# Patient Record
Sex: Female | Born: 1935
Health system: Southern US, Community
[De-identification: ages and names within clinical notes are randomized; demographics above are authoritative.]

## PROBLEM LIST (undated history)

## (undated) DIAGNOSIS — I739 Peripheral vascular disease, unspecified: Secondary | ICD-10-CM

## (undated) DIAGNOSIS — I69354 Hemiplegia and hemiparesis following cerebral infarction affecting left non-dominant side: Secondary | ICD-10-CM

## (undated) DIAGNOSIS — I639 Cerebral infarction, unspecified: Secondary | ICD-10-CM

## (undated) DIAGNOSIS — R251 Tremor, unspecified: Secondary | ICD-10-CM

## (undated) DIAGNOSIS — I10 Essential (primary) hypertension: Secondary | ICD-10-CM

## (undated) DIAGNOSIS — I779 Disorder of arteries and arterioles, unspecified: Secondary | ICD-10-CM

## (undated) HISTORY — PX: CAROTID ENDARTERECTOMY: SUR193

## (undated) HISTORY — DX: Peripheral vascular disease, unspecified: I73.9

## (undated) HISTORY — DX: Essential (primary) hypertension: I10

## (undated) HISTORY — DX: Disorder of arteries and arterioles, unspecified: I77.9

---

## 1995-05-15 DIAGNOSIS — I639 Cerebral infarction, unspecified: Secondary | ICD-10-CM

## 1995-05-15 HISTORY — DX: Cerebral infarction, unspecified: I63.9

## 2000-11-07 ENCOUNTER — Other Ambulatory Visit: Admission: RE | Admit: 2000-11-07 | Discharge: 2000-11-07 | Payer: Self-pay | Admitting: Dermatology

## 2000-11-30 ENCOUNTER — Emergency Department (HOSPITAL_COMMUNITY): Admission: EM | Admit: 2000-11-30 | Discharge: 2000-12-01 | Payer: Self-pay | Admitting: Emergency Medicine

## 2000-12-01 ENCOUNTER — Encounter: Payer: Self-pay | Admitting: Emergency Medicine

## 2001-05-12 ENCOUNTER — Encounter: Payer: Self-pay | Admitting: Family Medicine

## 2001-05-12 ENCOUNTER — Ambulatory Visit (HOSPITAL_COMMUNITY): Admission: RE | Admit: 2001-05-12 | Discharge: 2001-05-12 | Payer: Self-pay | Admitting: Family Medicine

## 2002-05-13 ENCOUNTER — Ambulatory Visit (HOSPITAL_COMMUNITY): Admission: RE | Admit: 2002-05-13 | Discharge: 2002-05-13 | Payer: Self-pay | Admitting: Family Medicine

## 2002-05-13 ENCOUNTER — Encounter: Payer: Self-pay | Admitting: Family Medicine

## 2002-07-22 ENCOUNTER — Other Ambulatory Visit: Admission: RE | Admit: 2002-07-22 | Discharge: 2002-07-22 | Payer: Self-pay | Admitting: Dermatology

## 2003-04-05 ENCOUNTER — Ambulatory Visit (HOSPITAL_COMMUNITY): Admission: RE | Admit: 2003-04-05 | Discharge: 2003-04-05 | Payer: Self-pay | Admitting: Family Medicine

## 2003-12-28 ENCOUNTER — Emergency Department (HOSPITAL_COMMUNITY): Admission: EM | Admit: 2003-12-28 | Discharge: 2003-12-28 | Payer: Self-pay | Admitting: Emergency Medicine

## 2005-04-23 ENCOUNTER — Ambulatory Visit (HOSPITAL_COMMUNITY): Admission: RE | Admit: 2005-04-23 | Discharge: 2005-04-23 | Payer: Self-pay | Admitting: Family Medicine

## 2005-04-23 ENCOUNTER — Ambulatory Visit: Payer: Self-pay | Admitting: Orthopedic Surgery

## 2005-05-01 ENCOUNTER — Ambulatory Visit (HOSPITAL_COMMUNITY): Admission: RE | Admit: 2005-05-01 | Discharge: 2005-05-01 | Payer: Self-pay | Admitting: Internal Medicine

## 2005-06-04 ENCOUNTER — Ambulatory Visit: Payer: Self-pay | Admitting: Orthopedic Surgery

## 2005-07-02 ENCOUNTER — Ambulatory Visit: Payer: Self-pay | Admitting: Orthopedic Surgery

## 2010-07-25 ENCOUNTER — Emergency Department (HOSPITAL_COMMUNITY)
Admission: EM | Admit: 2010-07-25 | Discharge: 2010-07-25 | Disposition: A | Discharge status: Home / Self Care | Payer: MEDICARE | Attending: Emergency Medicine | Admitting: Emergency Medicine

## 2010-07-25 DIAGNOSIS — L02419 Cutaneous abscess of limb, unspecified: Secondary | ICD-10-CM | POA: Insufficient documentation

## 2010-07-25 DIAGNOSIS — L089 Local infection of the skin and subcutaneous tissue, unspecified: Secondary | ICD-10-CM | POA: Insufficient documentation

## 2010-07-25 DIAGNOSIS — L03119 Cellulitis of unspecified part of limb: Secondary | ICD-10-CM | POA: Insufficient documentation

## 2010-07-25 DIAGNOSIS — Z8673 Personal history of transient ischemic attack (TIA), and cerebral infarction without residual deficits: Secondary | ICD-10-CM | POA: Insufficient documentation

## 2011-08-03 ENCOUNTER — Other Ambulatory Visit: Payer: Self-pay

## 2011-08-03 ENCOUNTER — Encounter (HOSPITAL_COMMUNITY): Payer: Self-pay

## 2011-08-03 ENCOUNTER — Emergency Department (HOSPITAL_COMMUNITY): Payer: Medicare Other

## 2011-08-03 ENCOUNTER — Inpatient Hospital Stay (HOSPITAL_COMMUNITY): Payer: Medicare Other

## 2011-08-03 ENCOUNTER — Inpatient Hospital Stay (HOSPITAL_COMMUNITY)
Admission: EM | Admit: 2011-08-03 | Discharge: 2011-08-06 | DRG: 923 | Disposition: A | Discharge status: Home / Self Care | Payer: Medicare Other | Attending: Internal Medicine | Admitting: Internal Medicine

## 2011-08-03 DIAGNOSIS — S93409A Sprain of unspecified ligament of unspecified ankle, initial encounter: Secondary | ICD-10-CM | POA: Diagnosis present

## 2011-08-03 DIAGNOSIS — R071 Chest pain on breathing: Secondary | ICD-10-CM | POA: Diagnosis present

## 2011-08-03 DIAGNOSIS — I517 Cardiomegaly: Secondary | ICD-10-CM | POA: Diagnosis present

## 2011-08-03 DIAGNOSIS — E876 Hypokalemia: Secondary | ICD-10-CM | POA: Diagnosis present

## 2011-08-03 DIAGNOSIS — R7989 Other specified abnormal findings of blood chemistry: Secondary | ICD-10-CM | POA: Diagnosis present

## 2011-08-03 DIAGNOSIS — M25552 Pain in left hip: Secondary | ICD-10-CM | POA: Diagnosis present

## 2011-08-03 DIAGNOSIS — Y93H2 Activity, gardening and landscaping: Secondary | ICD-10-CM

## 2011-08-03 DIAGNOSIS — IMO0002 Reserved for concepts with insufficient information to code with codable children: Secondary | ICD-10-CM | POA: Diagnosis present

## 2011-08-03 DIAGNOSIS — M6282 Rhabdomyolysis: Secondary | ICD-10-CM | POA: Diagnosis present

## 2011-08-03 DIAGNOSIS — S20219A Contusion of unspecified front wall of thorax, initial encounter: Secondary | ICD-10-CM | POA: Diagnosis present

## 2011-08-03 DIAGNOSIS — R001 Bradycardia, unspecified: Secondary | ICD-10-CM | POA: Diagnosis present

## 2011-08-03 DIAGNOSIS — R799 Abnormal finding of blood chemistry, unspecified: Secondary | ICD-10-CM | POA: Diagnosis present

## 2011-08-03 DIAGNOSIS — E860 Dehydration: Secondary | ICD-10-CM | POA: Diagnosis present

## 2011-08-03 DIAGNOSIS — I10 Essential (primary) hypertension: Secondary | ICD-10-CM | POA: Diagnosis present

## 2011-08-03 DIAGNOSIS — I498 Other specified cardiac arrhythmias: Secondary | ICD-10-CM | POA: Diagnosis present

## 2011-08-03 DIAGNOSIS — W1809XA Striking against other object with subsequent fall, initial encounter: Secondary | ICD-10-CM | POA: Diagnosis present

## 2011-08-03 DIAGNOSIS — M25559 Pain in unspecified hip: Secondary | ICD-10-CM | POA: Diagnosis present

## 2011-08-03 DIAGNOSIS — I6930 Unspecified sequelae of cerebral infarction: Secondary | ICD-10-CM

## 2011-08-03 DIAGNOSIS — D72829 Elevated white blood cell count, unspecified: Secondary | ICD-10-CM | POA: Diagnosis present

## 2011-08-03 DIAGNOSIS — I2589 Other forms of chronic ischemic heart disease: Secondary | ICD-10-CM | POA: Diagnosis present

## 2011-08-03 DIAGNOSIS — W19XXXA Unspecified fall, initial encounter: Secondary | ICD-10-CM | POA: Diagnosis present

## 2011-08-03 DIAGNOSIS — L98499 Non-pressure chronic ulcer of skin of other sites with unspecified severity: Secondary | ICD-10-CM | POA: Diagnosis present

## 2011-08-03 DIAGNOSIS — I69959 Hemiplegia and hemiparesis following unspecified cerebrovascular disease affecting unspecified side: Secondary | ICD-10-CM

## 2011-08-03 DIAGNOSIS — R748 Abnormal levels of other serum enzymes: Secondary | ICD-10-CM | POA: Diagnosis present

## 2011-08-03 DIAGNOSIS — T68XXXA Hypothermia, initial encounter: Principal | ICD-10-CM | POA: Diagnosis present

## 2011-08-03 DIAGNOSIS — X31XXXA Exposure to excessive natural cold, initial encounter: Secondary | ICD-10-CM | POA: Diagnosis present

## 2011-08-03 DIAGNOSIS — I69354 Hemiplegia and hemiparesis following cerebral infarction affecting left non-dominant side: Secondary | ICD-10-CM

## 2011-08-03 HISTORY — DX: Cerebral infarction, unspecified: I63.9

## 2011-08-03 HISTORY — DX: Tremor, unspecified: R25.1

## 2011-08-03 HISTORY — DX: Hemiplegia and hemiparesis following cerebral infarction affecting left non-dominant side: I69.354

## 2011-08-03 LAB — DIFFERENTIAL
Basophils Absolute: 0 10*3/uL (ref 0.0–0.1)
Eosinophils Relative: 0 % (ref 0–5)
Lymphocytes Relative: 6 % — ABNORMAL LOW (ref 12–46)
Lymphs Abs: 1.3 10*3/uL (ref 0.7–4.0)
Monocytes Absolute: 1.3 10*3/uL — ABNORMAL HIGH (ref 0.1–1.0)
Neutro Abs: 18.5 10*3/uL — ABNORMAL HIGH (ref 1.7–7.7)

## 2011-08-03 LAB — COMPREHENSIVE METABOLIC PANEL
ALT: 23 U/L (ref 0–35)
AST: 49 U/L — ABNORMAL HIGH (ref 0–37)
CO2: 24 mEq/L (ref 19–32)
Calcium: 10.2 mg/dL (ref 8.4–10.5)
Chloride: 97 mEq/L (ref 96–112)
Creatinine, Ser: 0.61 mg/dL (ref 0.50–1.10)
GFR calc Af Amer: >90 mL/min (ref >90)
GFR calc non Af Amer: 79 mL/min — ABNORMAL LOW (ref >90)
Glucose, Bld: 142 mg/dL — ABNORMAL HIGH (ref 70–99)
Sodium: 138 mEq/L (ref 135–145)
Total Bilirubin: 0.9 mg/dL (ref 0.3–1.2)

## 2011-08-03 LAB — MAGNESIUM: Magnesium: 1.9 mg/dL (ref 1.5–2.5)

## 2011-08-03 LAB — URINALYSIS, ROUTINE W REFLEX MICROSCOPIC
Bilirubin Urine: NEGATIVE
Nitrite: NEGATIVE
Protein, ur: >300 mg/dL — AB
Specific Gravity, Urine: 1.025 (ref 1.005–1.030)
Urobilinogen, UA: 0.2 mg/dL (ref 0.0–1.0)

## 2011-08-03 LAB — CBC
HCT: 42.3 % (ref 36.0–46.0)
Hemoglobin: 14.8 g/dL (ref 12.0–15.0)
MCV: 90.4 fL (ref 78.0–100.0)
RBC: 4.68 MIL/uL (ref 3.87–5.11)
WBC: 21.2 10*3/uL — ABNORMAL HIGH (ref 4.0–10.5)

## 2011-08-03 LAB — CARDIAC PANEL(CRET KIN+CKTOT+MB+TROPI)
CK, MB: 31.8 ng/mL (ref 0.3–4.0)
Relative Index: 2.3 (ref 0.0–2.5)
Total CK: 1389 U/L — ABNORMAL HIGH (ref 7–177)
Total CK: 1730 U/L — ABNORMAL HIGH (ref 7–177)
Troponin I: 2.61 ng/mL (ref <0.30)

## 2011-08-03 LAB — URINE MICROSCOPIC-ADD ON

## 2011-08-03 LAB — CK: Total CK: 809 U/L — ABNORMAL HIGH (ref 7–177)

## 2011-08-03 MED ORDER — DOCUSATE SODIUM 100 MG PO CAPS
100.0000 mg | ORAL_CAPSULE | Freq: Two times a day (BID) | ORAL | Status: DC
  Administered 2011-08-03 – 2011-08-06 (×7): 100 mg via ORAL
  Filled 2011-08-03 (×7): qty 1

## 2011-08-03 MED ORDER — METOPROLOL TARTRATE 25 MG PO TABS
12.5000 mg | ORAL_TABLET | Freq: Two times a day (BID) | ORAL | Status: DC
  Administered 2011-08-03 – 2011-08-05 (×5): 12.5 mg via ORAL
  Filled 2011-08-03 (×6): qty 1

## 2011-08-03 MED ORDER — ACETAMINOPHEN 325 MG PO TABS: 650.0000 mg | ORAL_TABLET | Freq: Four times a day (QID) | ORAL | Status: DC | PRN

## 2011-08-03 MED ORDER — CLOPIDOGREL 0.02 MG/30 ML ORAL SUSPENSION: 0.0200 mg | Freq: Every day | ORAL | Status: DC

## 2011-08-03 MED ORDER — ONDANSETRON HCL 4 MG PO TABS: 4.0000 mg | ORAL_TABLET | Freq: Four times a day (QID) | ORAL | Status: DC | PRN

## 2011-08-03 MED ORDER — ONDANSETRON HCL 4 MG/2ML IJ SOLN: 4.0000 mg | Freq: Four times a day (QID) | INTRAMUSCULAR | Status: DC | PRN

## 2011-08-03 MED ORDER — POTASSIUM CHLORIDE CRYS ER 20 MEQ PO TBCR
30.0000 meq | EXTENDED_RELEASE_TABLET | Freq: Two times a day (BID) | ORAL | Status: AC
  Administered 2011-08-03 (×2): 30 meq via ORAL
  Filled 2011-08-03 (×2): qty 1

## 2011-08-03 MED ORDER — SODIUM CHLORIDE 0.9 % IV SOLN: INTRAVENOUS | Status: DC

## 2011-08-03 MED ORDER — HEPARIN SODIUM (PORCINE) 5000 UNIT/ML IJ SOLN
5000.0000 [IU] | Freq: Three times a day (TID) | INTRAMUSCULAR | Status: DC
  Administered 2011-08-03 – 2011-08-06 (×10): 5000 [IU] via SUBCUTANEOUS
  Filled 2011-08-03 (×10): qty 1

## 2011-08-03 MED ORDER — POTASSIUM CHLORIDE IN NACL 20-0.9 MEQ/L-% IV SOLN
INTRAVENOUS | Status: DC
  Administered 2011-08-03 – 2011-08-05 (×4): via INTRAVENOUS

## 2011-08-03 MED ORDER — OXYCODONE HCL 5 MG PO TABS: 5.0000 mg | ORAL_TABLET | ORAL | Status: DC | PRN

## 2011-08-03 MED ORDER — GUAIFENESIN-DM 100-10 MG/5ML PO SYRP: 5.0000 mL | ORAL_SOLUTION | ORAL | Status: DC | PRN

## 2011-08-03 MED ORDER — CLOPIDOGREL BISULFATE 75 MG PO TABS
75.0000 mg | ORAL_TABLET | Freq: Every day | ORAL | Status: DC
  Administered 2011-08-04 – 2011-08-06 (×3): 75 mg via ORAL
  Filled 2011-08-03 (×3): qty 1

## 2011-08-03 MED ORDER — ALUM & MAG HYDROXIDE-SIMETH 200-200-20 MG/5ML PO SUSP: 30.0000 mL | Freq: Four times a day (QID) | ORAL | Status: DC | PRN

## 2011-08-03 MED ORDER — ACETAMINOPHEN 650 MG RE SUPP: 650.0000 mg | Freq: Four times a day (QID) | RECTAL | Status: DC | PRN

## 2011-08-03 NOTE — ED Notes (Signed)
Core temp 89.2

## 2011-08-03 NOTE — ED Notes (Signed)
Pt rectal temp 88.7

## 2011-08-03 NOTE — ED Provider Notes (Signed)
This chart was scribed for [employer]. Colon Branch, MD by Debbie Webb. The patient was seen in room APA14/[facility]14 at 8:45 AM.  CSN: [id number]  Arrival date & time 08/03/11  4098   First MD Initiated Contact with Patient 08/03/11 [phone]      Chief Complaint  Patient presents with  . Fall  . Cold Exposure    (Consider location/radiation/quality/duration/timing/severity/associated sxs/prior Treatment) Level 5 Caveat Due to the mental status of the patient Patient is a 78 y.o. female presenting with fall. The history is provided by the patient.  Fall The accident occurred 6 to 12 hours ago. The fall occurred in unknown circumstances. There was no blood loss. Treatment on scene includes a c-collar and a backboard. She has tried nothing for the symptoms.   Debbie Webb is a 78 y.o. female who presents to the Emergency Department complaining of an acute onset fall and cold exposure. Pt's son checked on her 4:30pm yesterday at home. He woke her and she said it was too cold to go outside. She went outside sometime after he left and fell. Pt was out side all night and was found this morning. Pt had no cell phone and no first alert. All peripheral pulses are intact, no evidence of ischemia.  Patient states she went out to gather up leaves and her wheelbarrow caught on a root and threw her to the ground. She was unable to get up . She climbed in the wheelbarrow to get out of the wind overnight.  PCP Dr. Sherwood Gambler Past Medical History  Diagnosis Date  . Stroke     History reviewed. No pertinent past surgical history.  No family history on file.  History  Substance Use Topics  . Smoking status: Never Smoker   . Smokeless tobacco: Not on file  . Alcohol Use: No    OB History    Grav Para Term Preterm Abortions TAB SAB Ect Mult Living                  Review of Systems  Unable to perform ROS: Other   10 Systems reviewed and are negative for acute change except as noted in the HPI.  Allergies   Review of patient's allergies indicates no known allergies.  Home Medications  No current outpatient prescriptions on file.  BP 136/101  Pulse 75  Physical Exam  Nursing note and vitals reviewed. Constitutional: She is oriented to person, place, and time.       Shivering, answering questions apporpriately  HENT:  Head: Normocephalic and atraumatic.  Neck: Normal range of motion. Neck supple.  Cardiovascular: Normal rate, regular rhythm and normal heart sounds.   Pulmonary/Chest:       Fine crackles at the bases bilaterally  Abdominal: Soft. There is no tenderness.  Musculoskeletal:       Left sided weakness result of stroke 2000  Neurological: She is alert and oriented to person, place, and time.  Skin:       Skin is cold to touch no abrasion, lesions or bruises present.     ED Course  Procedures (including critical care time) DIAGNOSTIC STUDIES: Oxygen Saturation is 100% on  [place] normal by my interpretation.   Results for orders placed during the hospital encounter of 08/03/11  CBC      Component Value Range   WBC 21.2 (*) 4.0 - 10.5 (K/uL)   RBC 4.68  3.87 - 5.11 (MIL/uL)   Hemoglobin 14.8  12.0 - 15.0 (g/dL)  HCT 42.3  36.0 - 46.0 (%)   MCV 90.4  78.0 - 100.0 (fL)   MCH 31.6  26.0 - 34.0 (pg)   MCHC 35.0  30.0 - 36.0 (g/dL)   RDW 96.2  95.2 - 84.1 (%)   Platelets 196  150 - 400 (K/uL)  DIFFERENTIAL      Component Value Range   Neutrophils Relative 88 (*) 43 - 77 (%)   Neutro Abs 18.5 (*) 1.7 - 7.7 (K/uL)   Lymphocytes Relative 6 (*) 12 - 46 (%)   Lymphs Abs 1.3  0.7 - 4.0 (K/uL)   Monocytes Relative 6  3 - 12 (%)   Monocytes Absolute 1.3 (*) 0.1 - 1.0 (K/uL)   Eosinophils Relative 0  0 - 5 (%)   Eosinophils Absolute 0.0  0.0 - 0.7 (K/uL)   Basophils Relative 0  0 - 1 (%)   Basophils Absolute 0.0  0.0 - 0.1 (K/uL)  COMPREHENSIVE METABOLIC PANEL      Component Value Range   Sodium 138  135 - 145 (mEq/L)   Potassium 2.9 (*) 3.5 - 5.1 (mEq/L)   Chloride 97   96 - 112 (mEq/L)   CO2 24  19 - 32 (mEq/L)   Glucose, Bld 142 (*) 70 - 99 (mg/dL)   BUN 27 (*) 6 - 23 (mg/dL)   Creatinine, Ser 3.24  0.50 - 1.10 (mg/dL)   Calcium 40.1  8.4 - 10.5 (mg/dL)   Total Protein 7.3  6.0 - 8.3 (g/dL)   Albumin 4.2  3.5 - 5.2 (g/dL)   AST 49 (*) 0 - 37 (U/L)   ALT 23  0 - 35 (U/L)   Alkaline Phosphatase 108  39 - 117 (U/L)   Total Bilirubin 0.9  0.3 - 1.2 (mg/dL)   GFR calc non Af Amer 79 (*) >90 (mL/min)   GFR calc Af Amer >90  >90 (mL/min)  URINALYSIS, ROUTINE W REFLEX MICROSCOPIC      Component Value Range   Color, Urine YELLOW  YELLOW    APPearance CLEAR  CLEAR    Specific Gravity, Urine 1.025  1.005 - 1.030    pH 7.0  5.0 - 8.0    Glucose, UA NEGATIVE  NEGATIVE (mg/dL)   Hgb urine dipstick MODERATE (*) NEGATIVE    Bilirubin Urine NEGATIVE  NEGATIVE    Ketones, ur 15 (*) NEGATIVE (mg/dL)   Protein, ur >027 (*) NEGATIVE (mg/dL)   Urobilinogen, UA 0.2  0.0 - 1.0 (mg/dL)   Nitrite NEGATIVE  NEGATIVE    Leukocytes, UA NEGATIVE  NEGATIVE   TROPONIN I      Component Value Range   Troponin I 1.09 (*) <0.30 (ng/mL)  URINE MICROSCOPIC-ADD ON      Component Value Range   Squamous Epithelial / LPF RARE  RARE    WBC, UA 0-2  <3 (WBC/hpf)   RBC / HPF 0-2  <3 (RBC/hpf)   Casts GRANULAR CAST (*) NEGATIVE   CK      Component Value Range   Total CK 809 (*) 7 - 177 (U/L)   COORDINATION OF CARE: Medications - No data to display  Dg Chest Port 1 View  08/03/2011  *RADIOLOGY REPORT*  Clinical Data: Hypothermia  PORTABLE CHEST - 1 VIEW  Comparison: None.  Findings: Cardiomegaly.  Calcified tortuous aorta. Minimal left midlung zone scarring.  No infiltrates or failure. No acute osseous findings.  Surgical clip right neck.  IMPRESSION: Cardiomegaly.  No active cardiopulmonary disease.  Original Report  Authenticated By: Elsie Stain, M.D.     Date: 08/03/2011  0907  Rate: 69  Rhythm: normal sinus rhythm and with sinus arrhythmia  QRS Axis: normal  Intervals:  normal  ST/T Wave abnormalities: nonspecific ST/T changes  Conduction Disutrbances:none  Narrative Interpretation: consider inferior ischemia  Old EKG Reviewed: none available  9:26 AM Rewarming with bear hugger, temp probe, foley catheter, warmed IV fluids, warmed humidifier.   11:20 AM Pt is looking and feeling much better. Pt is alert, oriented x3 and is up talking to family. 12:18:  T/C to Dr. Sherrie Mustache, hospitalist case discussed, including:  HPI, pertinent PM/SHx, VS/PE, dx testing, ED course and treatment.  Agreeable to admission.  Requests to write temporary orders, telemetry bed to team 1.     MDM  Patient who fell outside last yesterday afternoon and remained outside overnight. Arrived hypothermic. Initiated rewarming with air hunger, Foley catheter with temperature probe, warm IV fluids, warm humidified oxygen. Results have been discussed with the patient and her son.Patient has responded well. Core temperature was 88 upon arrival and is currently 94.Hemodynamically stable.  EKG was unremarkable. Total CK and troponin are elevated as would be expected. Admission for monitoring and further warming.Pt stable in ED with no significant deterioration in condition.The patient appears reasonably stabilized for admission considering the current resources, flow, and capabilities available in the ED at this time, and I doubt any other [facility] requiring further screening and/or treatment in the ED prior to admission.  I personally performed the services described in this documentation, which was scribed in my presence. The recorded information has been reviewed and considered.   MDM Reviewed: nursing note and vitals Interpretation: labs, ECG and x-ray Total time providing critical care: 40 minutes.           Nicoletta Dress. Colon Branch, MD 08/03/11 1230

## 2011-08-03 NOTE — H&P (Signed)
Debbie Webb MRN: [id number] DOB/AGE: 1920-10-02 78 y.o. Primary Care Physician:FUSCO,LAWRENCE J., MD, MD Admit date: 08/03/2011 Chief Complaint: Fall and couldn't get up. HPI: The patient is an 78 year old woman with a past medical history significant for a stroke in 1997 with residual left-sided hemiparesis, status post right endarterectomy x2, and hypertension, who was found by a neighbor early this morning laying on the ground. The patient went outside to gather some leaves yesterday evening just before dark. She had been putting the leaves in a wheelbarrel. As she was walking, the wheelbarrow hit a tree root and toppled over. She fell also but the barrel did not fall on her. She was actually using the wheelbarrow as a walker as well because she cannot ambulate without a walker because of left-sided weakness. She fell on her left side but she did not hit her head. There was no loss of consciousness. She did not have any pain at that time, but now she says that her left leg is sore. She was unable to get up. She yelled for help. No one came. So she gathered the leaves from the wheelbarrel to cover over herself to try to stay warm. She laid on the ground in sub-freezing temperatures overnight. The neighbor's dog was barking incessantly early this morning. Her neighbor saw that she was laying on the ground and eventually called EMS.  On arrival to the emergency department, her core temperature was 90.4. She was bradycardic with a heart rate of 45. Her blood pressure was 105/57. She was treated appropriately with a warming blanket. Her temperature is now 97.7. Her other lab data are significant for WBC of 21.2, potassium of 2.9, BUN of 27, troponin I of 1.09, and CK of 809. Her urinalysis reveals no WBCs but greater than 300 protein. Her chest x-ray reveals no acute cardiopulmonary disease. Her EKG reveals normal sinus rhythm with sinus arrhythmia and nonspecific ST and T wave abnormalities and (a lot of  artifact). She is being admitted for further evaluation and management.  Past Medical History  Diagnosis Date  . Stroke 1997  . Hemiparesis affecting left side as late effect of cerebrovascular accident 08/03/2011  . Tremor   . Hx of carotid artery stenosis On the right    Status post carotid endarterectomy x2  . Hypertension     Past Surgical History  Procedure Date  . Carotid endarterectomy On the right    X2    Prior to Admission medications   Not on File  She takes a blood pressure medicine that she cannot recall the name of. She also takes Plavix 75 mg daily.  Allergies: No Known Allergies  History reviewed. No pertinent family history.  Social History: She is widowed. She has one son, Debbie Webb who checks on her daily work altered daily. She is retired. She lives in [place] alone. She still drives. She is fairly independent. She denies tobacco, alcohol, and illicit drug use.         ROS: She has chronic left-sided weakness. She has some swelling of her right hand and some ulcerations on her hand as she was trying to grip the wheelbarrel to try to get up from the fall. Otherwise, review of systems is negative.  PHYSICAL EXAM: Blood pressure 107/58, pulse 70, temperature 97.7 F (36.5 C), temperature source Oral, resp. rate 18, height 5\' 5"  (1.651 m), weight 62.2 kg (137 lb 2 oz), SpO2 97.00%.  General: She is a very pleasant 78 year old Caucasian woman who is lying  in bed, in no acute distress. HEENT: Head is normocephalic, nontraumatic. Pupils are equal, round, and reactive to light. Extraocular was are intact. Conjunctivae are clear. Sclerae are white. Oropharynx reveals dry mucous membranes. No posterior exudates or erythema. Neck: Well-healed scar on the right. No adenopathy. No thyromegaly. There is a bruit on the right and not on the left. No JVD. Lungs: Clear to auscultation bilaterally. Heart: S1, S2, with a soft systolic murmur. Abdomen: Positive bowel  sounds, soft, nontender, nondistended. Extremities: Pedal pulses barely palpable. No pretibial edema. No pedal edema. There is mild tenderness over her left hip. There is some discomfort with internal and external rotation of her left hip. She has superficial ulcerations on her left hand on fingers 2 through 4. No bleeding or exudative drainage. There is mild global edema of her right hand. Radial pulse palpable. Neurologic: She is alert and oriented x3. Cranial nerves II through XII are grossly intact. There is 3 minus over 5 left lower extremity strength and 4 minus over 5 left upper extremity strength. Sensation mildly decreased on the left. Strength 5 over 5 of the right upper and right lower extremity.  Basic Metabolic Panel:  Basename 08/03/11 0906  NA 138  K 2.9*  CL 97  CO2 24  GLUCOSE 142*  BUN 27*  CREATININE 0.61  CALCIUM 10.2  MG --  PHOS --   Liver Function Tests:  [facility] 08/03/11 0906  AST 49*  ALT 23  ALKPHOS 108  BILITOT 0.9  PROT 7.3  ALBUMIN 4.2   No results found for this basename: LIPASE:2,AMYLASE:2 in the last 72 hours No results found for this basename: AMMONIA:2 in the last 72 hours CBC:  Basename 08/03/11 0906  WBC 21.2*  NEUTROABS 18.5*  HGB 14.8  HCT 42.3  MCV 90.4  PLT 196   Cardiac Enzymes:  Basename 08/03/11 0906  CKTOTAL 809*  CKMB --  CKMBINDEX --  TROPONINI 1.09*   BNP: No results found for this basename: PROBNP:3 in the last 72 hours D-Dimer: No results found for this basename: DDIMER:2 in the last 72 hours CBG: No results found for this basename: GLUCAP:6 in the last 72 hours Hemoglobin A1C: No results found for this basename: HGBA1C in the last 72 hours Fasting Lipid Panel: No results found for this basename: CHOL,HDL,LDLCALC,TRIG,CHOLHDL,LDLDIRECT in the last 72 hours Thyroid Function Tests: No results found for this basename: TSH,T4TOTAL,FREET4,T3FREE,THYROIDAB in the last 72 hours Anemia Panel: No results found for  this basename: VITAMINB12,FOLATE,FERRITIN,TIBC,IRON,RETICCTPCT in the last 72 hours Coagulation: No results found for this basename: LABPROT:2,INR:2 in the last 72 hours Urine Drug Screen: Drugs of Abuse  No results found for this basename: labopia,  cocainscrnur,  labbenz,  amphetmu,  thcu,  labbarb    Alcohol Level: No results found for this basename: ETH:2 in the last 72 hours Urinalysis:  Basename 08/03/11 1054  COLORURINE YELLOW  LABSPEC 1.025  PHURINE 7.0  GLUCOSEU NEGATIVE  HGBUR MODERATE*  BILIRUBINUR NEGATIVE  KETONESUR 15*  PROTEINUR >300*  UROBILINOGEN 0.2  NITRITE NEGATIVE  LEUKOCYTESUR NEGATIVE   Misc. Labs:     No results found for this or any previous visit (from the past 240 hour(s)).   Results for orders placed during the hospital encounter of 08/03/11 (from the past 48 hour(s))  CBC     Status: Abnormal   Collection Time   08/03/11  9:06 AM      Component Value Range Comment   WBC 21.2 (*) 4.0 - 10.5 (K/uL)  RBC 4.68  3.87 - 5.11 (MIL/uL)    Hemoglobin 14.8  12.0 - 15.0 (g/dL)    HCT 16.1  09.6 - 04.5 (%)    MCV 90.4  78.0 - 100.0 (fL)    MCH 31.6  26.0 - 34.0 (pg)    MCHC 35.0  30.0 - 36.0 (g/dL)    RDW 40.9  81.1 - 91.4 (%)    Platelets 196  150 - 400 (K/uL)   DIFFERENTIAL     Status: Abnormal   Collection Time   08/03/11  9:06 AM      Component Value Range Comment   Neutrophils Relative 88 (*) 43 - 77 (%)    Neutro Abs 18.5 (*) 1.7 - 7.7 (K/uL)    Lymphocytes Relative 6 (*) 12 - 46 (%)    Lymphs Abs 1.3  0.7 - 4.0 (K/uL)    Monocytes Relative 6  3 - 12 (%)    Monocytes Absolute 1.3 (*) 0.1 - 1.0 (K/uL)    Eosinophils Relative 0  0 - 5 (%)    Eosinophils Absolute 0.0  0.0 - 0.7 (K/uL)    Basophils Relative 0  0 - 1 (%)    Basophils Absolute 0.0  0.0 - 0.1 (K/uL)   COMPREHENSIVE METABOLIC PANEL     Status: Abnormal   Collection Time   08/03/11  9:06 AM      Component Value Range Comment   Sodium 138  135 - 145 (mEq/L)    Potassium 2.9  (*) 3.5 - 5.1 (mEq/L)    Chloride 97  96 - 112 (mEq/L)    CO2 24  19 - 32 (mEq/L)    Glucose, Bld 142 (*) 70 - 99 (mg/dL)    BUN 27 (*) 6 - 23 (mg/dL)    Creatinine, Ser 7.82  0.50 - 1.10 (mg/dL)    Calcium 95.6  8.4 - 10.5 (mg/dL)    Total Protein 7.3  6.0 - 8.3 (g/dL)    Albumin 4.2  3.5 - 5.2 (g/dL)    AST 49 (*) 0 - 37 (U/L)    ALT 23  0 - 35 (U/L)    Alkaline Phosphatase 108  39 - 117 (U/L)    Total Bilirubin 0.9  0.3 - 1.2 (mg/dL)    GFR calc non Af Amer 79 (*) >90 (mL/min)    GFR calc Af Amer >90  >90 (mL/min)   TROPONIN I     Status: Abnormal   Collection Time   08/03/11  9:06 AM      Component Value Range Comment   Troponin I 1.09 (*) <0.30 (ng/mL)   CK     Status: Abnormal   Collection Time   08/03/11  9:06 AM      Component Value Range Comment   Total CK 809 (*) 7 - 177 (U/L)   URINALYSIS, ROUTINE W REFLEX MICROSCOPIC     Status: Abnormal   Collection Time   08/03/11 10:54 AM      Component Value Range Comment   Color, Urine YELLOW  YELLOW     APPearance CLEAR  CLEAR     Specific Gravity, Urine 1.025  1.005 - 1.030     pH 7.0  5.0 - 8.0     Glucose, UA NEGATIVE  NEGATIVE (mg/dL)    Hgb urine dipstick MODERATE (*) NEGATIVE     Bilirubin Urine NEGATIVE  NEGATIVE     Ketones, ur 15 (*) NEGATIVE (mg/dL)    Protein, ur >213 (*)  NEGATIVE (mg/dL)    Urobilinogen, UA 0.2  0.0 - 1.0 (mg/dL)    Nitrite NEGATIVE  NEGATIVE     Leukocytes, UA NEGATIVE  NEGATIVE    URINE MICROSCOPIC-ADD ON     Status: Abnormal   Collection Time   08/03/11 10:54 AM      Component Value Range Comment   Squamous Epithelial / LPF RARE  RARE     WBC, UA 0-2  <3 (WBC/hpf)    RBC / HPF 0-2  <3 (RBC/hpf)    Casts GRANULAR CAST (*) NEGATIVE      Dg Chest Port 1 View  08/03/2011  *RADIOLOGY REPORT*  Clinical Data: Hypothermia  PORTABLE CHEST - 1 VIEW  Comparison: None.  Findings: Cardiomegaly.  Calcified tortuous aorta. Minimal left midlung zone scarring.  No infiltrates or failure. No acute  osseous findings.  Surgical clip right neck.  IMPRESSION: Cardiomegaly.  No active cardiopulmonary disease.  Original Report Authenticated By: Elsie Stain, M.D.    Impression:  Principal Problem:  *Hypothermia Active Problems:  Fall  Hypokalemia  Leukocytosis  Azotemia  Left hip pain  Personal history of stroke with residual effects  Hemiparesis affecting left side as late effect of cerebrovascular accident  Bradycardia  Hand ulceration  Elevated LFTs  Elevated troponin I level  Rhabdomyolysis   1. Hypothermia secondary to cold exposure. Now resolved status post treatment.  2. Status post fall. Apparently there was no loss of consciousness or any obvious neurological event. The patient is limited from left-sided hemiparesis and lost her balance when the wheelbarrel fell over.  3. Elevated troponin I./CK. Likely secondary to mild rhabdomyolysis. Next  4. Hypokalemia. Magnesium deficiency will be ruled out.  5. Azotemia. Secondary to dehydration.  6. Elevated LFTs, likely secondary to mild rhabdomyolysis.  7. Leukocytosis. Likely reactive.  8. Right hand ulcerations. This does not appear to be frostbite but is a consideration. She is right-handed and tried multiple times to get up. It is likely that she scraped her fingers on the wheel barrel or ground. There are no lesions on her left hand.  9. Previous stroke with chronic left-sided hemiparesis.  Plan:  1. The patient received 60 mEq potassium chloride in the ED. She also received 1-1/2 L of IV fluids. 2. We'll continue potassium chloride supplementation orally and gently and IV fluids. 3. Will continue IV fluid hydration. 4. Supportive treatment. We'll add Neosporin to the ulcerations on her right hand 5. For further evaluation, we'll check her cardiac enzymes, TSH, magnesium level, and pelvic x-ray. 6. PT consultation.      Giann Obara 08/03/2011, 2:49 PM

## 2011-08-03 NOTE — ED Notes (Signed)
Pt given a drink by pt advocate.

## 2011-08-03 NOTE — ED Notes (Signed)
Pt on monitor, bear hugger placed, warm fluids running.  Pt is removed from spine board by edp and denies any pain at this time.  Pt is alert and oriented.

## 2011-08-03 NOTE — ED Notes (Signed)
Core temp 86.5

## 2011-08-03 NOTE — Progress Notes (Signed)
CRITICAL VALUE ALERT  Critical value received:  CKMB   Date of notification:  08/03/2011  Time of notification:  1612  Critical value read back: yes  Nurse who received alert:  Tobey Grim  MD notified (1st page):Fisher   Time of first page:  1612  MD notified (2nd page):  Time of second page:  Responding MD: Sherrie Mustache  Time MD responded: 1620

## 2011-08-03 NOTE — Progress Notes (Signed)
The patient's total CK has increased to 1389, CK-MB is 31.8, and troponin I is 2.61. Given her recent fall and cold exposure in the freezing temperatures, she may have experienced myocardial ischemia. There also may be an element of rhabdomyolysis. We'll continue Plavix and subcutaneous prophylactic heparin. We'll add low-dose metoprolol. We'll check another EKG in the morning and reevaluate. Of note, she is chest pain-free.

## 2011-08-03 NOTE — ED Notes (Signed)
Critical value received Troponin 1.09, notified Dr strand

## 2011-08-03 NOTE — ED Notes (Signed)
Old catheter was removed due to not draining correctly.

## 2011-08-03 NOTE — ED Notes (Signed)
Per ems, pt was outside yesterday and fell in the woods while gardening.  Per ems, pt fell and was unable to get up d/t weakness. Pt was found this a.m by a neighbor's dog.  Pt is able to answer questions and appears neurologically intact.

## 2011-08-03 NOTE — ED Notes (Signed)
Family at bedside. 

## 2011-08-04 LAB — CARDIAC PANEL(CRET KIN+CKTOT+MB+TROPI)
CK, MB: 12.9 ng/mL (ref 0.3–4.0)
Relative Index: 0.9 (ref 0.0–2.5)
Troponin I: 1.39 ng/mL (ref <0.30)

## 2011-08-04 LAB — COMPREHENSIVE METABOLIC PANEL
ALT: 26 U/L (ref 0–35)
AST: 64 U/L — ABNORMAL HIGH (ref 0–37)
Albumin: 3.2 g/dL — ABNORMAL LOW (ref 3.5–5.2)
CO2: 27 mEq/L (ref 19–32)
Calcium: 9.4 mg/dL (ref 8.4–10.5)
GFR calc non Af Amer: 55 mL/min — ABNORMAL LOW (ref >90)
Sodium: 139 mEq/L (ref 135–145)

## 2011-08-04 LAB — HEMOGLOBIN A1C: Mean Plasma Glucose: 111 mg/dL (ref <117)

## 2011-08-04 LAB — CBC
HCT: 35.5 % — ABNORMAL LOW (ref 36.0–46.0)
Hemoglobin: 12.3 g/dL (ref 12.0–15.0)
MCV: 92.4 fL (ref 78.0–100.0)
RBC: 3.84 MIL/uL — ABNORMAL LOW (ref 3.87–5.11)
RDW: 13.3 % (ref 11.5–15.5)
WBC: 15.4 10*3/uL — ABNORMAL HIGH (ref 4.0–10.5)

## 2011-08-04 LAB — PROTIME-INR: INR: 1.13 (ref 0.00–1.49)

## 2011-08-04 LAB — TSH: TSH: 0.662 u[IU]/mL (ref 0.350–4.500)

## 2011-08-04 MED ORDER — VITAMIN C 500 MG PO TABS
500.0000 mg | ORAL_TABLET | Freq: Every day | ORAL | Status: DC
  Administered 2011-08-04 – 2011-08-06 (×3): 500 mg via ORAL
  Filled 2011-08-04 (×3): qty 1

## 2011-08-04 MED ORDER — NAPROXEN 250 MG PO TABS
250.0000 mg | ORAL_TABLET | Freq: Two times a day (BID) | ORAL | Status: DC | PRN
  Administered 2011-08-04: 250 mg via ORAL
  Filled 2011-08-04: qty 1

## 2011-08-04 MED ORDER — FOLIC ACID-VIT B6-VIT B12 2.5-25-1 MG PO TABS: 1.0000 | ORAL_TABLET | Freq: Every day | ORAL | Status: DC

## 2011-08-04 MED ORDER — SERTRALINE HCL 50 MG PO TABS
50.0000 mg | ORAL_TABLET | Freq: Every day | ORAL | Status: DC
  Administered 2011-08-04 – 2011-08-06 (×3): 50 mg via ORAL
  Filled 2011-08-04 (×3): qty 1

## 2011-08-04 MED ORDER — IBUPROFEN 800 MG PO TABS: 400.0000 mg | ORAL_TABLET | Freq: Three times a day (TID) | ORAL | Status: DC | PRN

## 2011-08-04 MED ORDER — NEPHRO-VITE 0.8 MG PO TABS
1.0000 | ORAL_TABLET | Freq: Every day | ORAL | Status: DC
  Administered 2011-08-04 – 2011-08-06 (×3): 1 via ORAL
  Filled 2011-08-04 (×3): qty 1

## 2011-08-04 MED ORDER — BACITRACIN ZINC 500 UNIT/GM EX OINT
1.0000 "application " | TOPICAL_OINTMENT | Freq: Two times a day (BID) | CUTANEOUS | Status: DC
  Administered 2011-08-04 – 2011-08-06 (×5): 1 via TOPICAL
  Filled 2011-08-04 (×5): qty 0.9

## 2011-08-04 MED ORDER — ADULT MULTIVITAMIN W/MINERALS CH
1.0000 | ORAL_TABLET | Freq: Every day | ORAL | Status: DC
  Administered 2011-08-04 – 2011-08-06 (×3): 1 via ORAL
  Filled 2011-08-04 (×3): qty 1

## 2011-08-04 NOTE — Progress Notes (Signed)
Chart reviewed.  Subjective: No chest Pain. Some left hip pain. Some right-sided rib pain along the posterior axillary line. She thinks she may have "cracked a rib". requesting antibiotic ointment for her abrasions  Objective: Vital signs in last 24 hours: Filed Vitals:   08/03/11 2052 08/04/11 0104 08/04/11 0537 08/04/11 1008  BP: 122/63  159/68   Pulse: 74  60   Temp: 98.4 F (36.9 C)  98.2 F (36.8 C)   TempSrc: Oral  Oral   Resp: 18  18   Height:      Weight:      SpO2: 97% 88% 97% 93%   Weight change:   Intake/Output Summary (Last 24 hours) at 08/04/11 1427 Last data filed at 08/04/11 1225  Gross per 24 hour  Intake   1540 ml  Output    900 ml  Net    640 ml   Physical Exam: General: Alert oriented appropriate HEENT slightly dry mucous membranes Lungs clear to auscultation bilaterally without wheeze rhonchi or rales Cardiovascular regular rate rhythm without murmurs gallops rubs Abdomen soft nontender nondistended Extremities no clubbing cyanosis, fingers of the right hand slightly edematous with abrasions. Left leg slightly tender to palpation around the greater trochanteric area. Full range of motion of the left hip. Musculoskeletal: No tenderness to palpation along the right rib cage  Lab Results: Basic Metabolic Panel:  Lab 08/04/11 1610 08/03/11 1440 08/03/11 0906  NA 139 -- 138  K 4.4 -- 2.9*  CL 105 -- 97  CO2 27 -- 24  GLUCOSE 95 -- 142*  BUN 23 -- 27*  CREATININE 0.91 -- 0.61  CALCIUM 9.4 -- 10.2  MG -- 1.9 --  PHOS -- -- --   Liver Function Tests:  Lab 08/04/11 0645 08/03/11 0906  AST 64* 49*  ALT 26 23  ALKPHOS 79 108  BILITOT 0.6 0.9  PROT 6.0 7.3  ALBUMIN 3.2* 4.2   No results found for this basename: LIPASE:2,AMYLASE:2 in the last 168 hours No results found for this basename: AMMONIA:2 in the last 168 hours CBC:  Lab 08/04/11 0645 08/03/11 0906  WBC 15.4* 21.2*  NEUTROABS -- 18.5*  HGB 12.3 14.8  HCT 35.5* 42.3  MCV 92.4 90.4    PLT 159 196   Cardiac Enzymes:  Lab 08/04/11 0645 08/03/11 2213 08/03/11 1440  CKTOTAL 1367* 1730* 1389*  CKMB 12.9* 19.8* 31.8*  CKMBINDEX -- -- --  TROPONINI 1.39* 2.19* 2.61*   BNP: No results found for this basename: PROBNP:3 in the last 168 hours D-Dimer: No results found for this basename: DDIMER:2 in the last 168 hours CBG: No results found for this basename: GLUCAP:6 in the last 168 hours Hemoglobin A1C:  Lab 08/03/11 1440  HGBA1C 5.5   Fasting Lipid Panel: No results found for this basename: CHOL,HDL,LDLCALC,TRIG,CHOLHDL,LDLDIRECT in the last 960 hours Thyroid Function Tests:  Lab 08/03/11 1440  TSH 0.662  T4TOTAL --  FREET4 --  T3FREE --  THYROIDAB --   Coagulation:  Lab 08/04/11 0645  LABPROT 14.7  INR 1.13   Anemia Panel: No results found for this basename: VITAMINB12,FOLATE,FERRITIN,TIBC,IRON,RETICCTPCT in the last 168 hours Urine Drug Screen: Drugs of Abuse  No results found for this basename: labopia, cocainscrnur, labbenz, amphetmu, thcu, labbarb    Alcohol Level: No results found for this basename: ETH:2 in the last 168 hours Urinalysis:  Lab 08/03/11 1054  COLORURINE YELLOW  LABSPEC 1.025  PHURINE 7.0  GLUCOSEU NEGATIVE  HGBUR MODERATE*  BILIRUBINUR NEGATIVE  KETONESUR 15*  PROTEINUR >300*  UROBILINOGEN 0.2  NITRITE NEGATIVE  LEUKOCYTESUR NEGATIVE   Micro Results: No results found for this or any previous visit (from the past 240 hour(s)). Studies/Results: Dg Pelvis 1-2 Views  08/03/2011  *RADIOLOGY REPORT*  Clinical Data: Fall 1 day ago with left hip pain and decreased range of motion.  PELVIS - 1-2 VIEW  Comparison: None.  Findings: Single AP view of the pelvis.  Rectal catheter.  Both femoral heads are located.  No displaced fracture identified.  Mild osteopenia. Sacroiliac joints are symmetric.  IMPRESSION: Osteopenia, without acute osseous abnormality. Limited by single pelvic film.  If there is a ongoing concern of hip  fracture, dedicated hip films should be considered.  Original Report Authenticated By: Consuello Bossier, M.D.   Dg Chest Port 1 View  08/03/2011  *RADIOLOGY REPORT*  Clinical Data: Hypothermia  PORTABLE CHEST - 1 VIEW  Comparison: None.  Findings: Cardiomegaly.  Calcified tortuous aorta. Minimal left midlung zone scarring.  No infiltrates or failure. No acute osseous findings.  Surgical clip right neck.  IMPRESSION: Cardiomegaly.  No active cardiopulmonary disease.  Original Report Authenticated By: Elsie Stain, M.D.   EKG sinus bradycardia with a rate of 50 to nonspecific changes  Scheduled Meds:   . bacitracin  1 application Topical BID  . clopidogrel  75 mg Oral Q breakfast  . docusate sodium  100 mg Oral BID  . heparin  5,000 Units Subcutaneous Q8H  . metoprolol tartrate  12.5 mg Oral BID  . potassium chloride  30 mEq Oral BID  . DISCONTD: sodium chloride   Intravenous STAT  . DISCONTD: clopidogrel  0.02 mg Oral Q breakfast   Continuous Infusions:   . 0.9 % NaCl with KCl 20 mEq / L 75 mL/hr at 08/04/11 0612   PRN Meds:.acetaminophen, alum & mag hydroxide-simeth, guaiFENesin-dextromethorphan, ibuprofen, ondansetron (ZOFRAN) IV, ondansetron, oxyCODONE, DISCONTD: acetaminophen Assessment/Plan:   *Hypothermia, resolved   Fall   Elevated troponin I level, likely related to rhabdomyolysis. EKG shows nothing concerning. Patient reports having had a negative stress test with [facility] 2-3 years ago.   Rhabdomyolysis   Leukocytosis   Hypokalemia, resolved   Azotemia, resolved   Left hip pain: Pelvic film negative and patient has full range of motion on exam.   Personal history of stroke with residual effects   Hemiparesis affecting left side as late effect of cerebrovascular accident   Bradycardia, asymptomatic   Abrasions of the right fingers   continue IV fluids. Await PT consult. Doubt acute coronary syndrome. I will get records from [facility] on Monday. Check echocardiogram for wall motion abnormalities. Patient has been chest pain-free and her elevated troponins likely related to rhabdomyolysis.    LOS: 1 day   Debbie Webb L 08/04/2011, 2:27 PM

## 2011-08-04 NOTE — Progress Notes (Signed)
Patient OOB to chair this afternoon with supervision.

## 2011-08-04 NOTE — Progress Notes (Addendum)
CRITICAL VALUE ALERT  Critical value received:  MB 12.9 Troponin 1.39  Date of notification:  08/04/2011  Time of notification:  0742  Critical value read back:yes  Nurse who received alert:  N.Ahnesty Finfrock,RN  MD notified (1st page):  Lendell Caprice  Time of first page:  [phone]  MD notified (2nd page):  Time of second page:  Responding MD:  None-MB/troponin trending down  Time MD responded:

## 2011-08-05 ENCOUNTER — Inpatient Hospital Stay (HOSPITAL_COMMUNITY): Payer: Medicare Other

## 2011-08-05 LAB — CARDIAC PANEL(CRET KIN+CKTOT+MB+TROPI): Troponin I: 0.43 ng/mL (ref <0.30)

## 2011-08-05 NOTE — Progress Notes (Signed)
Patient ambulated to [facility] with assistance. Ambulates at home with cane at times. Patient ambulated back to chair from [facility] with walker and supervision.

## 2011-08-05 NOTE — Progress Notes (Signed)
Subjective: Hip pain better. Now worried about left ankle bruising and swelling. Not much pain. Also complains of right sided pleuritic chest pain under her breast. No shortness of breath currently, but did notice some previously.  Objective: Vital signs in last 24 hours: Filed Vitals:   08/04/11 1452 08/04/11 2244 08/05/11 0610 08/05/11 1026  BP: 125/69 168/72 166/68   Pulse: 83 64 50 56  Temp: 98.2 F (36.8 C) 98.1 F (36.7 C) 98.2 F (36.8 C)   TempSrc: Oral Oral Oral   Resp: 18 18 18    Height:      Weight:      SpO2: 95% 93% 96%    Weight change:   Intake/Output Summary (Last 24 hours) at 08/05/11 1132 Last data filed at 08/05/11 1610  Gross per 24 hour  Intake   1485 ml  Output    550 ml  Net    935 ml   Physical Exam: General: Alert oriented appropriate HEENT moist mucous membranes Lungs clear to auscultation bilaterally without wheeze rhonchi or rales Cardiovascular regular rate rhythm without murmurs gallops rubs Abdomen soft nontender nondistended Extremities no clubbing cyanosis, fingers of the right hand less edematous with abrasions. Left lateral malleolus bruised and mildly tender to palpation. Musculoskeletal: No chest wall tenderness.  Lab Results: Basic Metabolic Panel:  Lab 08/04/11 9604 08/03/11 1440 08/03/11 0906  NA 139 -- 138  K 4.4 -- 2.9*  CL 105 -- 97  CO2 27 -- 24  GLUCOSE 95 -- 142*  BUN 23 -- 27*  CREATININE 0.91 -- 0.61  CALCIUM 9.4 -- 10.2  MG -- 1.9 --  PHOS -- -- --   Liver Function Tests:  Lab 08/04/11 0645 08/03/11 0906  AST 64* 49*  ALT 26 23  ALKPHOS 79 108  BILITOT 0.6 0.9  PROT 6.0 7.3  ALBUMIN 3.2* 4.2   No results found for this basename: LIPASE:2,AMYLASE:2 in the last 168 hours No results found for this basename: AMMONIA:2 in the last 168 hours CBC:  Lab 08/04/11 0645 08/03/11 0906  WBC 15.4* 21.2*  NEUTROABS -- 18.5*  HGB 12.3 14.8  HCT 35.5* 42.3  MCV 92.4 90.4  PLT 159 196   Cardiac  Enzymes:  Lab 08/05/11 0655 08/04/11 0645 08/03/11 2213  CKTOTAL 517* 1367* 1730*  CKMB 7.3* 12.9* 19.8*  CKMBINDEX -- -- --  TROPONINI 0.43* 1.39* 2.19*   BNP: No results found for this basename: PROBNP:3 in the last 168 hours D-Dimer: No results found for this basename: DDIMER:2 in the last 168 hours CBG: No results found for this basename: GLUCAP:6 in the last 168 hours Hemoglobin A1C:  Lab 08/03/11 1440  HGBA1C 5.5   Fasting Lipid Panel: No results found for this basename: CHOL,HDL,LDLCALC,TRIG,CHOLHDL,LDLDIRECT in the last 540 hours Thyroid Function Tests:  Lab 08/03/11 1440  TSH 0.662  T4TOTAL --  FREET4 --  T3FREE --  THYROIDAB --   Coagulation:  Lab 08/04/11 0645  LABPROT 14.7  INR 1.13   Anemia Panel: No results found for this basename: VITAMINB12,FOLATE,FERRITIN,TIBC,IRON,RETICCTPCT in the last 168 hours Urine Drug Screen: Drugs of Abuse  No results found for this basename: labopia,  cocainscrnur,  labbenz,  amphetmu,  thcu,  labbarb    Alcohol Level: No results found for this basename: ETH:2 in the last 168 hours Urinalysis:  Lab 08/03/11 1054  COLORURINE YELLOW  LABSPEC 1.025  PHURINE 7.0  GLUCOSEU NEGATIVE  HGBUR MODERATE*  BILIRUBINUR NEGATIVE  KETONESUR 15*  PROTEINUR >300*  UROBILINOGEN 0.2  NITRITE NEGATIVE  LEUKOCYTESUR NEGATIVE   Micro Results: No results found for this or any previous visit (from the past 240 hour(s)). Studies/Results: Dg Pelvis 1-2 Views  08/03/2011  *RADIOLOGY REPORT*  Clinical Data: Fall 1 day ago with left hip pain and decreased range of motion.  PELVIS - 1-2 VIEW  Comparison: None.  Findings: Single AP view of the pelvis.  Rectal catheter.  Both femoral heads are located.  No displaced fracture identified.  Mild osteopenia. Sacroiliac joints are symmetric.  IMPRESSION: Osteopenia, without acute osseous abnormality. Limited by single pelvic film.  If there is a ongoing concern of hip fracture, dedicated hip films  should be considered.  Original Report Authenticated By: Consuello Bossier, M.D.   EKG sinus bradycardia with a rate of 50 to nonspecific changes  Scheduled Meds:    . b complex-vitamin c-folic acid  1 tablet Oral Daily  . bacitracin  1 application Topical BID  . clopidogrel  75 mg Oral Q breakfast  . docusate sodium  100 mg Oral BID  . heparin  5,000 Units Subcutaneous Q8H  . metoprolol tartrate  12.5 mg Oral BID  . mulitivitamin with minerals  1 tablet Oral Daily  . sertraline  50 mg Oral Daily  . vitamin C  500 mg Oral Daily  . DISCONTD: Folic Acid-Vit B6-Vit B12  1 tablet Oral Daily   Continuous Infusions:    . DISCONTD: 0.9 % NaCl with KCl 20 mEq / L 100 mL/hr at 08/05/11 0500   PRN Meds:.acetaminophen, alum & mag hydroxide-simeth, guaiFENesin-dextromethorphan, naproxen, ondansetron (ZOFRAN) IV, ondansetron, oxyCODONE, DISCONTD: acetaminophen, DISCONTD: ibuprofen Assessment/Plan:   *Hypothermia, resolved   Fall   Elevated troponin I level, likely related to rhabdomyolysis. EKG shows nothing concerning. Patient reports having had a negative stress test with [facility] 2-3 years ago.   Rhabdomyolysis improving. Discontinue IV fluids and push fluids by mouth.   Leukocytosis   Hypokalemia, resolved   Azotemia, resolved   Left hip pain improved   Personal history of stroke with residual effects   Hemiparesis affecting left side as late effect of cerebrovascular accident   Bradycardia, asymptomatic   Abrasions of the right fingers  Left ankle bruising and swelling. Check x-ray rule out fracture.  Pleuritic right chest pain: Check d-dimer. If positive, CT angiogram of the chest. Continue DVT prophylaxis. Await physical therapy, echocardiogram.   LOS: 2 days   Debbie Webb L 08/05/2011, 11:32 AM

## 2011-08-05 NOTE — Progress Notes (Signed)
Dr. Lendell Caprice notified of D-dimer results.

## 2011-08-06 ENCOUNTER — Inpatient Hospital Stay (HOSPITAL_COMMUNITY): Payer: Medicare Other

## 2011-08-06 MED ORDER — IOHEXOL 350 MG/ML SOLN
100.0000 mL | Freq: Once | INTRAVENOUS | Status: AC | PRN
  Administered 2011-08-06: 100 mL via INTRAVENOUS

## 2011-08-06 MED ORDER — SODIUM CHLORIDE 0.9 % IJ SOLN
INTRAMUSCULAR | Status: AC
  Administered 2011-08-06: 10 mL
  Filled 2011-08-06: qty 3

## 2011-08-06 MED ORDER — POTASSIUM CHLORIDE 10 MEQ/100ML IV SOLN
INTRAVENOUS | Status: AC
  Filled 2011-08-06: qty 100

## 2011-08-06 NOTE — Progress Notes (Signed)
CARE MANAGEMENT NOTE 08/06/2011  Patient:  Debbie Webb, Debbie Webb   Account Number:  [id number]  Date Initiated:  08/06/2011  Documentation initiated by:  Rosemary Holms  Subjective/Objective Assessment:   Pt admitted from home after falling outside and cold exposure from being outside all night. Her son lives next door to her. Previous CVA and used [facility] then.     Action/Plan:   Spoke to pt at bedside. She plans to DC to her son's home and ask friend to visit her during the day.   Anticipated DC Date:  08/07/2011   Anticipated DC Plan:  HOME/SELF CARE      DC Planning Services  CM consult      Choice offered to / List presented to:     DME arranged  [facility] - Lavone Nian      DME agency  [facility]     [facility] arranged  HH-1 RN  HH-10 DISEASE MANAGEMENT  HH-2 PT  HH-4 NURSE'S AIDE      HH agency  [facility]   Status of service:  Completed, signed off Medicare Important Message given?   (If response is "NO", the following Medicare IM given date fields will be blank) Date Medicare IM given:   Date Additional Medicare IM given:    Discharge Disposition:  HOME W HOME HEALTH SERVICES  Per UR Regulation:    If discussed at Long Length of Stay Meetings, dates discussed:    Comments:  08/06/11 1600 Arrayah Connors Leanord Hawking RN BSN [facility] RN, PT, Aide and RW  08/06/11 1000 Kadince Boxley Leanord Hawking RN BSN

## 2011-08-06 NOTE — Progress Notes (Signed)
CARE MANAGEMENT NOTE 08/06/2011  Patient:  Debbie Webb, WOOLLARD   Account Number:  [id number]  Date Initiated:  08/06/2011  Documentation initiated by:  Rosemary Holms  Subjective/Objective Assessment:   Pt admitted from home after falling outside and cold exposure from being outside all night. Her son lives next door to her. Previous CVA and used [facility] then.     Action/Plan:   Spoke to pt at bedside. She plans to DC to her son's home and ask friend to visit her during the day.   Anticipated DC Date:  08/07/2011   Anticipated DC Plan:  HOME/SELF CARE      DC Planning Services  CM consult      Choice offered to / List presented to:             [facility] agency  [facility]   Status of service:  In process, will continue to follow Medicare Important Message given?   (If response is "NO", the following Medicare IM given date fields will be blank) Date Medicare IM given:   Date Additional Medicare IM given:    Discharge Disposition:    Per UR Regulation:    If discussed at Long Length of Stay Meetings, dates discussed:    Comments:  08/06/11 1000 Cameo Schmiesing Leanord Hawking RN BSN

## 2011-08-06 NOTE — Discharge Summary (Signed)
Physician Discharge Summary  [name] MRN: [id number] DOB/AGE: [birth year] 78 y.o.  Admit date: 08/03/2011 Discharge date: 08/06/2011  Discharge Diagnoses:  Principal Problem:  *Hypothermia Active Problems:  Fall  Elevated troponin I level  Rhabdomyolysis  Leukocytosis  Hypokalemia  Azotemia  Left hip indication  Personal history of stroke with residual effects  Hemiparesis affecting left side as late effect of cerebrovascular accident  Bradycardia  Hand abrasions Chest wall contusion Left ankle sprain Severe left ventricular hypertrophy with grade 1 diastolic dysfunction on echocardiogram    Medication List  As of 08/06/2011  4:24 PM   TAKE these medications         ALEVE 220 MG tablet   Generic drug: naproxen sodium   Take 220 mg by mouth as needed. For pain      amLODipine 5 MG tablet   Commonly known as: NORVASC   Take 5 mg by mouth daily.      CENTRUM SILVER ULTRA WOMENS Tabs   Take 1 tablet by mouth daily.      clopidogrel 75 MG tablet   Commonly known as: PLAVIX   Take 75 mg by mouth daily.      FOLIC ACID-VIT B6-VIT B12 PO   Place 1 tablet under the tongue daily. Unknown strength. Is a combination SL tablet      hydrochlorothiazide 25 MG tablet   Commonly known as: HYDRODIURIL   Take 25 mg by mouth daily.      sertraline 50 MG tablet   Commonly known as: ZOLOFT   Take 50 mg by mouth daily.      simvastatin 40 MG tablet   Commonly known as: ZOCOR   Take 40 mg by mouth every evening.      vitamin C 500 MG tablet   Commonly known as: ASCORBIC ACID   Take 500 mg by mouth daily.      VITAMIN E PO   Take 1 tablet by mouth daily. Unknown strength. OTC            Discharge Orders    Future Orders Please Complete By Expires   Diet - low sodium heart healthy      Walker          Follow-up Information    Follow up with Cassell Smiles., MD. (As needed)    Contact information:   [address] [place] [address] [phone]       Follow up with [facility]. [phone] RN PT Aide)          Disposition: 01-Home or Self Care with home health nurse, a, physical therapy   Discharged Condition:  stable   Consults:   physical therapy   Labs:   Sodium 139      Potassium         4.4      Chloride         105      CO2         27      Mean Plasma Glucose         111      BUN         23      Creatinine, Ser         0.91      Calcium         9.4      GFR calc non Af [name]  55      GFR calc Af Amer         63      Glucose, Bld         95      Magnesium         1.9      Alkaline Phosphatase         79      Albumin         3.2      AST         64      ALT         26      Total Protein         6.0      Total Bilirubin         0.6      CARDIAC PROFILE   CK, MB         12.9 7.3     Total CK         1367 517     Troponin I         1.39 0.43     PROTEIN ELP   Total Protein         6.0      CBC   WBC         15.4      RBC         3.84      Hemoglobin         12.3      HCT         35.5      MCV         92.4      MCH         32.0      MCHC         34.6      RDW         13.3      Platelets         159      DIFFERENTIAL   Neutrophils Relative         88      Lymphocytes Relative         6      Monocytes Relative         6      Eosinophils Relative         0      Basophils Relative         0      Neutro Abs         18.5      Lymphs Abs         1.3      Monocytes Absolute         1.3      Eosinophils Absolute         0.0      Basophils Absolute         0.0      COAG OTHER   D-Dimer, Quant           1.02    PROTIME W/ INR   Prothrombin Time         14.7      INR         1.13      DIABETES   Hemoglobin A1C         5.5      Glucose, Bld  95      THYROID   TSH         0.662      URINALYSIS   Color, Urine         YELLOW      APPearance         CLEAR      Specific Gravity, Urine         1.025      pH         7.0      Glucose, UA          NEGATIVE      Bilirubin Urine         NEGATIVE      Ketones, ur         15      Protein, ur         >300      Urobilinogen, UA         0.2      Nitrite         NEGATIVE      Leukocytes, UA         NEGATIVE      Hgb urine dipstick         MODERATE      WBC, UA         0-2      RBC / HPF         0-2      Squamous Epithelial / LPF         RARE      Casts         GRANULAR CAST     Diagnostics:  Dg Pelvis 1-2 Views  08/03/2011  *RADIOLOGY REPORT*  Clinical Data: Fall 1 day ago with left hip pain and decreased range of motion.  PELVIS - 1-2 VIEW  Comparison: None.  Findings: Single AP view of the pelvis.  Rectal catheter.  Both femoral heads are located.  No displaced fracture identified.  Mild osteopenia. Sacroiliac joints are symmetric.  IMPRESSION: Osteopenia, without acute osseous abnormality. Limited by single pelvic film.  If there is a ongoing concern of hip fracture, dedicated hip films should be considered.  Original Report Authenticated By: Consuello Bossier, M.D.   Dg Ankle Complete Left  08/05/2011  *RADIOLOGY REPORT*  Clinical Data: Larey Seat and injured left ankle 2 days ago, persistent bruising, pain, and swelling.  Prior left ankle fracture.  LEFT ANKLE COMPLETE - 3+ VIEW 08/05/2011:  Comparison: The ankle x-rays 04/23/1005.  Findings: Old healed fracture involving the distal fibula. Osteopenia.  No evidence of acute fracture or dislocation.  Ankle mortise intact with well-preserved joint space.  No visible joint effusion.  Spur/enthesopathy at the insertion of the Achilles tendon on the posterior calcaneus.  IMPRESSION: No acute osseous abnormality.  Osteopenia.  Original Report Authenticated By: Arnell Sieving, M.D.   Ct Angio Chest W/cm &/or Wo Cm  08/06/2011  *RADIOLOGY REPORT*  Clinical Data: Shortness of breath.  Elevated D-dimer.  CT ANGIOGRAPHY CHEST  Technique:  Multidetector CT imaging of the chest using the standard protocol during bolus administration of intravenous contrast.  Multiplanar reconstructed images including MIPs were obtained and reviewed to evaluate the vascular anatomy.  Contrast: OMNIPAQUE IOHEXOL 350 MG/ML IV SOLN  Comparison: None  Findings: The chest wall is unremarkable.  No supraclavicular or axillary lymphadenopathy.  The bony thorax is intact.  The heart is normal in size for age.  No pericardial effusion.  No  mediastinal or hilar lymphadenopathy.  The aorta demonstrates moderate atherosclerotic calcifications but no focal aneurysm or dissection.  Coronary artery calcifications are noted.  The pulmonary arterial tree is fairly well opacified.  No filling defects to suggest pulmonary emboli.  There is a large, 6 cm, air and fluid filled structure in the azygo- esophageal recess.  This is surrounded by pleural fluid.  This is most likely a large esophageal diverticulum.  A pleural abscess is unlikely.  There are small bilateral pleural effusion with overlying atelectasis and patchy airspace opacity in the left lower lobe which could be a developing infiltrate.  No pulmonary edema or worrisome pulmonary mass lesion.  The upper abdomen is grossly normal.  IMPRESSION:  1.  No CT findings for pulmonary embolism. 2.  6 cm esophageal diverticulum suspected on the right side.  A barium swallow would be confirmatory. 3.  Advanced atherosclerotic calcifications involving the thoracic and upper abdominal aorta but no dissection or aneurysm. 4.  Small bilateral pleural effusions, right greater than left with overlying atelectasis. 5.  Patchy infiltrate in the superior segment left lower lobe.  Original Report Authenticated By: P. Loralie Champagne, M.D.   Dg Chest Port 1 View  08/03/2011  *RADIOLOGY REPORT*  Clinical Data: Hypothermia  PORTABLE CHEST - 1 VIEW  Comparison: None.  Findings: Cardiomegaly.  Calcified tortuous aorta. Minimal left midlung zone scarring.  No infiltrates or failure. No acute osseous findings.  Surgical clip right neck.  IMPRESSION: Cardiomegaly.   No active cardiopulmonary disease.  Original Report Authenticated By: Elsie Stain, M.D.    echocardiogram  Left ventricle: The cavity size was normal. There was severe concentric hypertrophy. Systolic function was normal. The estimated ejection fraction was in the range of 55% to 60%. Although no diagnostic regional wall motion abnormality was identified, this possibility cannot be completely excluded on the basis of this study. Doppler parameters are consistent with abnormal left ventricular relaxation (grade 1 diastolic dysfunction). The E/e' ratio is >10, suggesting elevated LV filling pressure. - Aortic valve: Sclerosis without stenosis. Transvalvular velocity was minimally increased, however, there is at best very mild aortic stenosis. Mean gradient: 10mm Hg (S). Peak gradient: 18mm Hg (S). - Mitral valve: Mildly thickened leaflets . Mild regurgitation. - Left atrium: Moderately dilated. - Pulmonary arteries: PA peak pressure: 34mm Hg + right atrial pressure (S). - Systemic veins: The IVC is not well visualized. - Pericardium, extracardiac: A trivial pericardial effusion was identified. There is an apical fat pad. There also appears to be either ascities or a left pleural effusion seen on the subcostal view, which appears to be perihepatic and may represent ascites. Consider a dedicated abdominal ultrasound or CT for further characterization.   EKG: Normal sinus rhythm with nonspecific changes  Full Code   Hospital Course: See H&P for complete admission details minutes crank is a pleasant 78 year old white female with previous stroke who now and was unable to get up. She spent the evening outside and sub freezing temperatures. A neighbor found her the next morning when, wouldn't stop barking.   In the emergency room, she was hypothermic with a temperature of 90.4. She was bradycardic with a heart rate of 45. She had a leukocytosis, hypokalemia, BUN of 27, CPK of 809. EKG  showed nothing concerning. She was performed with a warming blanket. She was monitored on telemetry. Her troponins were elevated but she had no chest pain and this is likely related to rhabdomyolysis. Echocardiogram was done and she had no evidence of  formation abnormality. Her ejection fraction was normal. Her white blood cell count normalized without antibiotics and she had no signs of infection. She did have left hip pain, left ankle pain, chest wall pain and some pleuritic chest pain. Workup was negative for fracture, PE. She also had pain abrasions and swelling on her right hand. By the time of discharge, she was feeling much better. Her bruising and swelling in various pains were resolving. She has worked with physical therapy and will benefit from home physical therapy. I've also recommended that she stay with her son for a few days with 24-hour supervision until she is a bit stronger. Physical therapy has recommended a rolling walker which I will arrange. Total time on the day of discharge is greater than 30 minutes.  Discharge Exam:  Blood pressure 100/57, pulse 58, temperature 97.8 F (36.6 C), temperature source Oral, resp. rate 20, height 5\' 5"  (1.651 m), weight 62.2 kg (137 lb 2 oz), SpO2 93.00%. Unchanged from 08/05/2011   Signed: Crista Curb L 08/06/2011, 4:24 PM

## 2011-08-06 NOTE — Evaluation (Addendum)
Physical Therapy Evaluation Patient Details Name: Debbie Webb MRN: [id number] DOB: [birth year] Today's Date: 08/06/2011  Problem List:  Patient Active Problem List  Diagnoses  . Hypothermia  . Fall  . Hypokalemia  . Leukocytosis  . Azotemia  . Left hip pain  . Personal history of stroke with residual effects  . Hemiparesis affecting left side as late effect of cerebrovascular accident  . Bradycardia  . Hand ulceration  . Elevated LFTs  . Elevated troponin I level  . Rhabdomyolysis    Past Medical History:  Past Medical History  Diagnosis Date  . Stroke 1997  . Hemiparesis affecting left side as late effect of cerebrovascular accident 08/03/2011  . Tremor   . Hx of carotid artery stenosis On the right    Status post carotid endarterectomy x2  . Hypertension    Past Surgical History:  Past Surgical History  Procedure Date  . Carotid endarterectomy On the right    X2    PT Assessment/Plan/Recommendation PT Assessment Clinical Impression Statement: Pt with decreased balance who will benefit from skilled physical therapy as an outpatient basis to decrease risk of falls.  Pt states that she has been falling a lot at home but always has been able to get herself back up.   PT Recommendation/Assessment: Patient will need skilled PT in the acute care venue for balance program  to improve safety and decrease risk of falling. PT Problem List: Decreased strength;Decreased balance PT Therapy Diagnosis : Generalized weakness;Other (comment) (decreased balance) PT Recommendation Equipment Recommended: Rolling walker with 5" wheels PT Goals  Acute Rehab PT Goals PT Goal Formulation: With patient Pt will go Supine/Side to Sit: with modified independence Pt will go Sit to Supine/Side: with modified independence Pt will go Stand to Sit: with modified independence  PT Evaluation Precautions/Restrictions  Precautions Precautions: Fall Precaution Comments: Pt has had a previous  stroke Required Braces or Orthoses: No Restrictions Weight Bearing Restrictions: No Prior Functioning  Home Living Lives With: Alone Receives Help From: Family Type of Home: House Home Layout: Two level;Able to live on main level with bedroom/bathroom Alternate Level Stairs-Rails: Right Alternate Level Stairs-Number of Steps: 8 Home Access: Stairs to enter Entrance Stairs-Number of Steps: 6 Bathroom Shower/Tub: Engineer, manufacturing systems: Standard Home Adaptive Equipment: Environmental consultant - rolling;Straight cane Prior Function Level of Independence: Independent with basic ADLs Vocation: Retired Financial risk analyst Arousal/Alertness: Awake/alert Overall Cognitive Status: Appears within functional limits for tasks assessed Orientation Level: Oriented X4 Sensation/Coordination Coordination Gross Motor Movements are Fluid and Coordinated: Yes Fine Motor Movements are Fluid and Coordinated: Yes Extremity Assessment RUE Assessment RUE Assessment: Within Functional Limits LUE Assessment LUE Assessment: Exceptions to WFL LUE AROM (degrees) Overall AROM Left Upper Extremity: Deficits;Due to premorbid status (stroke has left shouler ROM at approximately 50%) LUE Strength LUE Overall Strength: Deficits;Due to premorbid status LUE Overall Strength Comments: shoulder strength 2+/5; elbow and wrist 3+ hand 3+ Left Shoulder Flexion: 3/5 RLE Assessment RLE Assessment: Within Functional Limits LLE Strength LLE Overall Strength Comments: 3+/5 due to previous stroke Mobility (including Balance) Bed Mobility Bed Mobility: Yes Supine to Sit: 4: Min assist Sitting - Scoot to Edge of Bed: 6: Modified independent (Device/Increase time) Sit to Supine: 4: Min assist Transfers Transfers: Yes Sit to Stand: 5: Supervision Stand to Sit: 6: Modified independent (Device/Increase time) Stand Pivot Transfers: 5: Supervision Ambulation/Gait Ambulation/Gait: Yes Ambulation/Gait Assistance: 6: Modified  independent (Device/Increase time) Ambulation Distance (Feet): 200 Feet Assistive device: Rolling walker Gait Pattern: Step-through pattern Gait  velocity: slower Stairs: No Wheelchair Mobility Wheelchair Mobility: No  Balance Balance Assessed: Yes Static Standing Balance Single Leg Stance - Right Leg: 0  Single Leg Stance - Left Leg: 0   Exercise was not completed due to pt needing to have an echocardiogram. End of Session PT - End of Session Equipment Utilized During Treatment: Gait belt Activity Tolerance: Patient tolerated treatment well Patient left: in chair Nurse Communication: Mobility status for transfers General Behavior During Session: [facility] for tasks performed Cognition: [facility] for tasks performed  Gerardo Caiazzo,CINDY 08/06/2011, 9:17 AM

## 2011-08-06 NOTE — Progress Notes (Signed)
UR Chart Review Completed  

## 2011-08-06 NOTE — Progress Notes (Signed)
08/06/11 1725 Patient discharged home with son this evening. Reviewed discharge instructions with patient and son, given copy of instructions, med list, f/u appointment information. [facility] per [facility], walker delivered to room and taken home per son. Verbalized understanding of instructions. IV site d/c'd per nurse tech, within normal limits. Telemetry d/c'd. Denied pain or discomfort prior to discharge. Pt left floor in stable condition via w/c accompanied by nurse tech.

## 2011-08-06 NOTE — Progress Notes (Signed)
*  PRELIMINARY RESULTS* Echocardiogram 2D Echocardiogram has been performed.  Debbie Webb 08/06/2011, 9:58 AM

## 2011-08-23 ENCOUNTER — Ambulatory Visit (INDEPENDENT_AMBULATORY_CARE_PROVIDER_SITE_OTHER): Payer: Medicare Other | Admitting: Orthopedic Surgery

## 2011-08-23 ENCOUNTER — Encounter: Payer: Self-pay | Admitting: Orthopedic Surgery

## 2011-08-23 VITALS — BP 140/80 | Ht 63.0 in | Wt 134.0 lb

## 2011-08-23 DIAGNOSIS — S93409A Sprain of unspecified ligament of unspecified ankle, initial encounter: Secondary | ICD-10-CM | POA: Insufficient documentation

## 2011-08-23 NOTE — Patient Instructions (Signed)
Wear her ankle brace except for bathing and sleeping.  Wear brace for one month.  An appointment will be arranged for one month revisit.

## 2011-08-23 NOTE — Progress Notes (Signed)
  Subjective:    Debbie Webb is a 78 y.o. female who presents with pain and swelling of the LEFT foot and ankle after falling. She was admitted to the hospital in March and was felt to have a LEFT ankle sprain. She complains of sharp, burning, 4/10 pain with locking bruising and swelling.  She is angulatory with a walker  Review of systems, fatigue, unsteady gait, and anxiety. Otherwise, normal  The following portions of the patient's history were reviewed and updated as appropriate: allergies, current medications, past family history, past medical history, past social history, past surgical history and problem list.    Objective:    BP 140/80  Ht 5\' 3"  (1.6 m)  Wt 134 lb (60.782 kg)  BMI 23.74 kg/m2 Right ankle:   normal  Left ankle:   the position of the ankle is in valgus. It is stable. It is limited in motion. Muscle tone is normal. Scan shows urgent and ecchymosis, old with redness extending from the distal 3rd of the fibula, down onto the proximal portion of the foot.   Imaging: X-ray of the left ankle(s): no fractures seen    Assessment:    Ankle sprain    Plan:    Fit with ankle brace for use over next 4 weeks.   ASO brace

## 2011-09-26 ENCOUNTER — Ambulatory Visit (INDEPENDENT_AMBULATORY_CARE_PROVIDER_SITE_OTHER): Payer: Medicare Other | Admitting: Orthopedic Surgery

## 2011-09-26 ENCOUNTER — Encounter: Payer: Self-pay | Admitting: Orthopedic Surgery

## 2011-09-26 VITALS — BP 130/70 | Ht 63.0 in | Wt 138.0 lb

## 2011-09-26 DIAGNOSIS — M76829 Posterior tibial tendinitis, unspecified leg: Secondary | ICD-10-CM

## 2011-09-26 DIAGNOSIS — M6789 Other specified disorders of synovium and tendon, multiple sites: Secondary | ICD-10-CM

## 2011-09-26 NOTE — Progress Notes (Signed)
[name], female   DOB: 1920/10/01, 78 y.o.   MRN: [id number] Chief Complaint  Patient presents with  . Follow-up    follow up left ankle    Status post ankle sprain treated with ASO bracing times one month  Complains of chronic flatfoot deformity with posterior tibial tendon dysfunction and Achilles tightness.  Recommendations: afo with arch support with [employer]

## 2011-09-26 NOTE — Patient Instructions (Signed)
Call [employer] make appt for afo

## 2012-01-25 ENCOUNTER — Emergency Department (HOSPITAL_COMMUNITY): Payer: Medicare Other

## 2012-01-25 ENCOUNTER — Encounter (HOSPITAL_COMMUNITY): Payer: Self-pay

## 2012-01-25 ENCOUNTER — Observation Stay (HOSPITAL_COMMUNITY)
Admission: EM | Admit: 2012-01-25 | Discharge: 2012-01-26 | Disposition: A | Discharge status: Home / Self Care | Payer: Medicare Other | Attending: Internal Medicine | Admitting: Internal Medicine

## 2012-01-25 DIAGNOSIS — K219 Gastro-esophageal reflux disease without esophagitis: Secondary | ICD-10-CM | POA: Diagnosis present

## 2012-01-25 DIAGNOSIS — R7989 Other specified abnormal findings of blood chemistry: Secondary | ICD-10-CM

## 2012-01-25 DIAGNOSIS — I6930 Unspecified sequelae of cerebral infarction: Secondary | ICD-10-CM

## 2012-01-25 DIAGNOSIS — L98499 Non-pressure chronic ulcer of skin of other sites with unspecified severity: Secondary | ICD-10-CM

## 2012-01-25 DIAGNOSIS — I10 Essential (primary) hypertension: Secondary | ICD-10-CM | POA: Diagnosis present

## 2012-01-25 DIAGNOSIS — T68XXXA Hypothermia, initial encounter: Secondary | ICD-10-CM

## 2012-01-25 DIAGNOSIS — R079 Chest pain, unspecified: Principal | ICD-10-CM | POA: Insufficient documentation

## 2012-01-25 DIAGNOSIS — I495 Sick sinus syndrome: Secondary | ICD-10-CM | POA: Insufficient documentation

## 2012-01-25 DIAGNOSIS — M6282 Rhabdomyolysis: Secondary | ICD-10-CM

## 2012-01-25 DIAGNOSIS — S93409A Sprain of unspecified ligament of unspecified ankle, initial encounter: Secondary | ICD-10-CM

## 2012-01-25 DIAGNOSIS — M25552 Pain in left hip: Secondary | ICD-10-CM

## 2012-01-25 DIAGNOSIS — D72829 Elevated white blood cell count, unspecified: Secondary | ICD-10-CM

## 2012-01-25 DIAGNOSIS — R001 Bradycardia, unspecified: Secondary | ICD-10-CM | POA: Diagnosis present

## 2012-01-25 DIAGNOSIS — R0789 Other chest pain: Secondary | ICD-10-CM | POA: Diagnosis present

## 2012-01-25 DIAGNOSIS — Z8673 Personal history of transient ischemic attack (TIA), and cerebral infarction without residual deficits: Secondary | ICD-10-CM | POA: Insufficient documentation

## 2012-01-25 DIAGNOSIS — I69959 Hemiplegia and hemiparesis following unspecified cerebrovascular disease affecting unspecified side: Secondary | ICD-10-CM

## 2012-01-25 DIAGNOSIS — W19XXXA Unspecified fall, initial encounter: Secondary | ICD-10-CM

## 2012-01-25 DIAGNOSIS — Z8679 Personal history of other diseases of the circulatory system: Secondary | ICD-10-CM | POA: Insufficient documentation

## 2012-01-25 DIAGNOSIS — I779 Disorder of arteries and arterioles, unspecified: Secondary | ICD-10-CM

## 2012-01-25 DIAGNOSIS — E876 Hypokalemia: Secondary | ICD-10-CM | POA: Diagnosis present

## 2012-01-25 DIAGNOSIS — I69354 Hemiplegia and hemiparesis following cerebral infarction affecting left non-dominant side: Secondary | ICD-10-CM

## 2012-01-25 LAB — BASIC METABOLIC PANEL
BUN: 20 mg/dL (ref 6–23)
Chloride: 99 mEq/L (ref 96–112)
GFR calc Af Amer: 74 mL/min — ABNORMAL LOW (ref >90)
GFR calc non Af Amer: 63 mL/min — ABNORMAL LOW (ref >90)
Glucose, Bld: 95 mg/dL (ref 70–99)
Potassium: 3.2 mEq/L — ABNORMAL LOW (ref 3.5–5.1)
Sodium: 138 mEq/L (ref 135–145)

## 2012-01-25 LAB — PROTIME-INR
INR: 1.05 (ref 0.00–1.49)
Prothrombin Time: 13.9 seconds (ref 11.6–15.2)

## 2012-01-25 LAB — CBC
HCT: 39 % (ref 36.0–46.0)
Hemoglobin: 13.3 g/dL (ref 12.0–15.0)
MCHC: 34.1 g/dL (ref 30.0–36.0)
WBC: 7.2 10*3/uL (ref 4.0–10.5)

## 2012-01-25 MED ORDER — SODIUM CHLORIDE 0.9 % IJ SOLN: 3.0000 mL | INTRAMUSCULAR | Status: DC | PRN

## 2012-01-25 MED ORDER — AMLODIPINE BESYLATE 5 MG PO TABS
5.0000 mg | ORAL_TABLET | Freq: Every day | ORAL | Status: DC
  Administered 2012-01-26: 5 mg via ORAL
  Filled 2012-01-25: qty 1

## 2012-01-25 MED ORDER — HYDROCHLOROTHIAZIDE 25 MG PO TABS
25.0000 mg | ORAL_TABLET | Freq: Every day | ORAL | Status: DC
  Administered 2012-01-26: 25 mg via ORAL
  Filled 2012-01-25: qty 1

## 2012-01-25 MED ORDER — SIMVASTATIN 20 MG PO TABS
40.0000 mg | ORAL_TABLET | Freq: Every day | ORAL | Status: DC
  Administered 2012-01-25: 40 mg via ORAL
  Filled 2012-01-25: qty 2

## 2012-01-25 MED ORDER — OXYBUTYNIN CHLORIDE 5 MG PO TABS
2.5000 mg | ORAL_TABLET | Freq: Two times a day (BID) | ORAL | Status: DC
  Administered 2012-01-25 – 2012-01-26 (×2): 2.5 mg via ORAL
  Filled 2012-01-25 (×2): qty 1

## 2012-01-25 MED ORDER — ASPIRIN EC 81 MG PO TBEC
81.0000 mg | DELAYED_RELEASE_TABLET | Freq: Every day | ORAL | Status: DC
  Administered 2012-01-26: 81 mg via ORAL
  Filled 2012-01-25: qty 1

## 2012-01-25 MED ORDER — ACETAMINOPHEN 325 MG PO TABS: 650.0000 mg | ORAL_TABLET | ORAL | Status: DC | PRN

## 2012-01-25 MED ORDER — CLOPIDOGREL BISULFATE 75 MG PO TABS
75.0000 mg | ORAL_TABLET | Freq: Every day | ORAL | Status: DC
  Administered 2012-01-26: 75 mg via ORAL
  Filled 2012-01-25: qty 1

## 2012-01-25 MED ORDER — NITROGLYCERIN 0.4 MG SL SUBL: 0.4000 mg | SUBLINGUAL_TABLET | SUBLINGUAL | Status: DC | PRN

## 2012-01-25 MED ORDER — SODIUM CHLORIDE 0.9 % IV SOLN: 250.0000 mL | INTRAVENOUS | Status: DC | PRN

## 2012-01-25 MED ORDER — VITAMIN C 500 MG PO TABS
500.0000 mg | ORAL_TABLET | Freq: Every day | ORAL | Status: DC
  Administered 2012-01-26: 500 mg via ORAL
  Filled 2012-01-25: qty 1

## 2012-01-25 MED ORDER — ASPIRIN 81 MG PO CHEW
324.0000 mg | CHEWABLE_TABLET | Freq: Once | ORAL | Status: AC
  Administered 2012-01-25: 324 mg via ORAL
  Filled 2012-01-25: qty 4

## 2012-01-25 MED ORDER — ASPIRIN 300 MG RE SUPP
300.0000 mg | RECTAL | Status: AC
Start: 2012-01-25 — End: 2012-01-25
  Filled 2012-01-25: qty 1

## 2012-01-25 MED ORDER — SERTRALINE HCL 50 MG PO TABS
50.0000 mg | ORAL_TABLET | Freq: Every day | ORAL | Status: DC
  Administered 2012-01-26: 50 mg via ORAL
  Filled 2012-01-25: qty 1

## 2012-01-25 MED ORDER — SODIUM CHLORIDE 0.9 % IJ SOLN
3.0000 mL | Freq: Two times a day (BID) | INTRAMUSCULAR | Status: DC
  Administered 2012-01-25 – 2012-01-26 (×2): 3 mL via INTRAVENOUS
  Filled 2012-01-25 (×2): qty 3

## 2012-01-25 MED ORDER — ONDANSETRON HCL 4 MG/2ML IJ SOLN: 4.0000 mg | Freq: Four times a day (QID) | INTRAMUSCULAR | Status: DC | PRN

## 2012-01-25 MED ORDER — ASPIRIN 81 MG PO CHEW: 324.0000 mg | CHEWABLE_TABLET | ORAL | Status: AC

## 2012-01-25 NOTE — ED Notes (Signed)
Attempted to give report to Maralyn Sago, RN

## 2012-01-25 NOTE — ED Notes (Signed)
Pt ambulated to restroom, pt also is giving a urine sample.

## 2012-01-25 NOTE — ED Provider Notes (Signed)
History   This chart was scribed for Tobin Chad, MD by Toya Smothers. The patient was seen in room APA09/APA09. Patient's care was started at 1531.  CSN: [id number]  Arrival date & time 01/25/12  1531   First MD Initiated Contact with Patient 01/25/12 1533      Chief Complaint  Patient presents with  . Sore Throat   HPI  Debbie Webb is a 78 y.o. female with a h/o CVA ('98) caused by caused by collated artery who presents to the Emergency Department complaining of 1 hour of sudden onset moderate constant lower chest radiating posteriorly and upward towards the mandible. Pt describes onset while seated, not associated with eating. Pain is described as a soreness unlike any before, yet symptoms have subsided since arrival to the ED. She endorses associated moderate stress due to financial issues. Pt arrived to the ED by EMS. Symptoms were no treated prior to arrival. Pt denies fever, chills, emesis, nausea, rash, and cough.  Past Medical History  Diagnosis Date  . Stroke 1997  . Hemiparesis affecting left side as late effect of cerebrovascular accident 08/03/2011  . Tremor   . Hx of carotid artery stenosis On the right    Status post carotid endarterectomy x2  . Hypertension     Past Surgical History  Procedure Date  . Carotid endarterectomy On the right    X2    No family history on file.  History  Substance Use Topics  . Smoking status: Never Smoker   . Smokeless tobacco: Not on file  . Alcohol Use: No   Review of Systems  Constitutional: Negative for fever and chills.  HENT: Negative for rhinorrhea.   Eyes: Negative for pain.  Respiratory: Negative.  Negative for cough, chest tightness and shortness of breath.   Cardiovascular: Positive for chest pain. Negative for palpitations and leg swelling.  Gastrointestinal: Negative for nausea, vomiting, abdominal pain and diarrhea.  Genitourinary: Negative for dysuria and difficulty urinating.  Musculoskeletal: Negative for  back pain.  Skin: Negative for rash.  Neurological: Negative for dizziness, syncope, weakness, light-headedness and headaches.  Hematological: Negative.   Psychiatric/Behavioral: Negative.     Allergies  Review of patient's allergies indicates no known allergies.  Home Medications   Current Outpatient Rx  Name Route Sig Dispense Refill  . AMLODIPINE BESYLATE 5 MG PO TABS Oral Take 5 mg by mouth daily.    Marland Kitchen CLOPIDOGREL BISULFATE 75 MG PO TABS Oral Take 75 mg by mouth daily.    Marland Kitchen FOLIC ACID-VIT B6-VIT B12 PO Sublingual Place 1 tablet under the tongue daily. Unknown strength. Is a combination SL tablet    . HYDROCHLOROTHIAZIDE 25 MG PO TABS Oral Take 25 mg by mouth daily.    . CENTRUM SILVER ULTRA WOMENS PO TABS Oral Take 1 tablet by mouth daily.    Marland Kitchen NAPROXEN SODIUM 220 MG PO TABS Oral Take 220 mg by mouth as needed. For pain    . OXYBUTYNIN CHLORIDE 5 MG PO TABS Oral Take 2.5 mg by mouth 2 (two) times daily.     . SERTRALINE HCL 50 MG PO TABS Oral Take 50 mg by mouth daily.    Marland Kitchen SIMVASTATIN 40 MG PO TABS Oral Take 40 mg by mouth every evening.    Marland Kitchen VITAMIN C 500 MG PO TABS Oral Take 500 mg by mouth daily.    Marland Kitchen VITAMIN E PO Oral Take 1 tablet by mouth daily. Unknown strength. OTC  BP 190/51  Pulse 54  Temp 97.9 F (36.6 C) (Oral)  Resp 18  SpO2 95%  Physical Exam  Nursing note and vitals reviewed. Constitutional: She is oriented to person, place, and time. She appears well-developed and well-nourished. No distress.  HENT:  Head: Normocephalic and atraumatic.  Right Ear: External ear normal.  Left Ear: External ear normal.  Nose: Nose normal.  Mouth/Throat: Oropharynx is clear and moist. No oropharyngeal exudate.  Eyes: Conjunctivae normal and EOM are normal. Pupils are equal, round, and reactive to light. Right eye exhibits no discharge. Left eye exhibits no discharge. No scleral icterus.  Neck: Normal range of motion. Neck supple. No tracheal deviation present. No  thyromegaly present.  Cardiovascular: Normal rate, regular rhythm, S1 normal and S2 normal.   Murmur (Small 1 and 6.) heard. Pulmonary/Chest: Effort normal and breath sounds normal. No respiratory distress. She has no wheezes. She has no rales.  Abdominal: Bowel sounds are normal. She exhibits no distension. There is no tenderness. There is no rebound and no guarding.  Musculoskeletal: Normal range of motion. She exhibits no edema and no tenderness.       Pulse intact all 4 extremities. No deformity of all 4 extremities. Calves non-tender.  Lymphadenopathy:    She has no cervical adenopathy.  Neurological: She is alert and oriented to person, place, and time. No cranial nerve deficit.  Skin: Skin is warm and dry. No rash noted. She is not diaphoretic. No erythema. No pallor.  Psychiatric: She has a normal mood and affect.    ED Course  Procedures (including critical care time) DIAGNOSTIC STUDIES: Oxygen Saturation is 95% on room air, adequate by my interpretation.    COORDINATION OF CARE: 15:47- Evaluated Pt. Pt is awake, alert,and oriented.   Labs Reviewed  BASIC METABOLIC PANEL - Abnormal; Notable for the following:    Potassium 3.2 (*)     GFR calc non Af Amer 63 (*)     GFR calc Af Amer 74 (*)     All other components within normal limits  CBC  TROPONIN I  PROTIME-INR   Dg Chest 2 View  01/25/2012  *RADIOLOGY REPORT*  Clinical Data: Chest pain.  CHEST - 2 VIEW  Comparison: Portable chest x-ray 08/03/2011.  CTA chest 08/06/2011.  Findings: Cardiac silhouette mildly enlarged but stable.  Thoracic aorta atherosclerotic, unchanged.  Air-fluid level within the presumed esophageal diverticulum, based on the prior CT.   Lungs clear.  Bronchovascular markings normal.  Pulmonary vascularity normal.  No pneumothorax.  No pleural effusions.  Degenerative changes involving the thoracic spine and generalized osteopenia.  IMPRESSION:  1.  Stable cardiomegaly.  No acute cardiopulmonary  disease. 2.  Air-fluid level within what is likely an esophageal diverticulum, based on the prior CT.   Original Report Authenticated By: Arnell Sieving, M.D.      No diagnosis found.   Date: 01/25/2012  Rate: 50 bpm  Rhythm: sinus bradycardia  QRS Axis: left  Intervals: normal  ST/T Wave abnormalities: nonspecific ST changes  Conduction Disutrbances:none  Narrative Interpretation:   Old EKG Reviewed: unchanged     MDM  Pt presents for evaluation of chest discomfort.  She is currently pain free, NAD.  CP eval has been initiated.  ECG is not consistent with a STEMI. 1825.  Pt stable, NAD.  Pt remains pain free.  CXR negative for acute findings, PNA, PTX, or effusion.  Trop neg x1.  Plan replete K+ with oral potassium.  Secondary to risk  factors and pain that radiated into her jaw and back, plan admission to hospital for further evaluation.  Discussed with on-call hospitalist provider.  I personally performed the services described in this documentation, which was scribed in my presence. The recorded information has been reviewed and considered.       Tobin Chad, MD 01/25/12 [phone]

## 2012-01-25 NOTE — H&P (Signed)
PCP:   Cassell Smiles., MD   Chief Complaint:  cp  HPI: 78 yo female sitting at home watching tv started with sscp radiated up to bilateral jaw/ear areas lasted about 45 minutes and resolved on its own without any specific treatment.  No n/v or sob.  No fevers or cough.  Has gerd and this did not feel like her regular heartburn.  Does not take any reflux meds.  No prior cardiac history and reports having a normal stress test about 2 years ago.  No prior h/o cath.  H/o cva.currently cp free.  Review of Systems:  O/w neg  Past Medical History: Past Medical History  Diagnosis Date  . Stroke 1997  . Hemiparesis affecting left side as late effect of cerebrovascular accident 08/03/2011  . Tremor   . Hx of carotid artery stenosis On the right    Status post carotid endarterectomy x2  . Hypertension    Past Surgical History  Procedure Date  . Carotid endarterectomy On the right    X2    Medications: Prior to Admission medications   Medication Sig Start Date End Date Taking? Authorizing Provider  amLODipine (NORVASC) 5 MG tablet Take 5 mg by mouth daily.   Yes Historical Provider, MD  clopidogrel (PLAVIX) 75 MG tablet Take 75 mg by mouth daily.   Yes Historical Provider, MD  FOLIC ACID-VIT B6-VIT B12 PO Place 1 tablet under the tongue daily. Unknown strength. Is a combination SL tablet   Yes Historical Provider, MD  hydrochlorothiazide (HYDRODIURIL) 25 MG tablet Take 25 mg by mouth daily.   Yes Historical Provider, MD  Multiple Vitamins-Minerals (CENTRUM SILVER ULTRA WOMENS) TABS Take 1 tablet by mouth daily.   Yes Historical Provider, MD  naproxen sodium (ALEVE) 220 MG tablet Take 220 mg by mouth as needed. For pain   Yes Historical Provider, MD  oxybutynin (DITROPAN) 5 MG tablet Take 2.5 mg by mouth 2 (two) times daily.    Yes Historical Provider, MD  sertraline (ZOLOFT) 50 MG tablet Take 50 mg by mouth daily.   Yes Historical Provider, MD  simvastatin (ZOCOR) 40 MG tablet Take 40  mg by mouth every evening.   Yes Historical Provider, MD  vitamin C (ASCORBIC ACID) 500 MG tablet Take 500 mg by mouth daily.   Yes Historical Provider, MD  VITAMIN E PO Take 1 tablet by mouth daily. Unknown strength. OTC   Yes Historical Provider, MD    Allergies:  No Known Allergies  Social History:  reports that she has never smoked. She does not have any smokeless tobacco history on file. She reports that she does not drink alcohol or use illicit drugs.  Physical Exam: Filed Vitals:   01/25/12 1533 01/25/12 1803 01/25/12 2150  BP: 190/51 152/91 148/57  Pulse: 54 57 50  Temp: 97.9 F (36.6 C)  97.4 F (36.3 C)  TempSrc: Oral  Oral  Resp: 18 16 18   SpO2: 95% 97% 95%   General appearance: alert, cooperative and no distress Lungs: clear to auscultation bilaterally Heart: regular rate and rhythm, S1, S2 normal, no murmur, click, rub or gallop Abdomen: soft, non-tender; bowel sounds normal; no masses,  no organomegaly Extremities: extremities normal, atraumatic, no cyanosis or edema Pulses: 2+ and symmetric Skin: Skin color, texture, turgor normal. No rashes or lesions Neurologic: Grossly normal    Labs on Admission:   [facility] 01/25/12 1644  NA 138  K 3.2*  CL 99  CO2 30  GLUCOSE 95  BUN 20  CREATININE 0.80  CALCIUM 10.5  MG --  PHOS --    Basename 01/25/12 1644  WBC 7.2  NEUTROABS --  HGB 13.3  HCT 39.0  MCV 92.4  PLT 174    Basename 01/25/12 1644  CKTOTAL --  CKMB --  CKMBINDEX --  TROPONINI <0.30   Radiological Exams on Admission: Dg Chest 2 View  01/25/2012  *RADIOLOGY REPORT*  Clinical Data: Chest pain.  CHEST - 2 VIEW  Comparison: Portable chest x-ray 08/03/2011.  CTA chest 08/06/2011.  Findings: Cardiac silhouette mildly enlarged but stable.  Thoracic aorta atherosclerotic, unchanged.  Air-fluid level within the presumed esophageal diverticulum, based on the prior CT.   Lungs clear.  Bronchovascular markings normal.  Pulmonary vascularity  normal.  No pneumothorax.  No pleural effusions.  Degenerative changes involving the thoracic spine and generalized osteopenia.  IMPRESSION:  1.  Stable cardiomegaly.  No acute cardiopulmonary disease. 2.  Air-fluid level within what is likely an esophageal diverticulum, based on the prior CT.   Original Report Authenticated By: Arnell Sieving, M.D.     Assessment/Plan Present on Admission:  78 yo female with atypical cp .Chest pain .Hypertension  Already on plavix, add asa.  Romi, tele.  outpt stress testing.  ?ppi at d/c.  Lipids in am.  ekg sinus brady with nonspecific st/twave changes, nothing acute.   Donetta Isaza [phone] 01/25/2012, 10:04 PM

## 2012-01-25 NOTE — ED Notes (Signed)
Per ems, pt c/o pain to her throat.  Pt reports "it feels like something is stuck".  To swelling, difficulty swallowing, or sob.  nad noted

## 2012-01-26 DIAGNOSIS — E876 Hypokalemia: Secondary | ICD-10-CM

## 2012-01-26 DIAGNOSIS — K219 Gastro-esophageal reflux disease without esophagitis: Secondary | ICD-10-CM

## 2012-01-26 DIAGNOSIS — I498 Other specified cardiac arrhythmias: Secondary | ICD-10-CM

## 2012-01-26 LAB — LIPID PANEL
HDL: 51 mg/dL (ref >39)
LDL Cholesterol: 90 mg/dL (ref 0–99)
Total CHOL/HDL Ratio: 3 RATIO
VLDL: 14 mg/dL (ref 0–40)

## 2012-01-26 LAB — TROPONIN I: Troponin I: <0.3 ng/mL (ref <0.30)

## 2012-01-26 MED ORDER — RANITIDINE HCL 75 MG PO TABS: 75.0000 mg | ORAL_TABLET | Freq: Two times a day (BID) | ORAL | Status: DC

## 2012-01-26 MED ORDER — POTASSIUM CHLORIDE CRYS ER 15 MEQ PO TBCR: 30.0000 meq | EXTENDED_RELEASE_TABLET | Freq: Every day | ORAL | Status: DC

## 2012-01-26 MED ORDER — POTASSIUM CHLORIDE CRYS ER 20 MEQ PO TBCR
30.0000 meq | EXTENDED_RELEASE_TABLET | Freq: Two times a day (BID) | ORAL | Status: DC
  Administered 2012-01-26: 30 meq via ORAL
  Filled 2012-01-26: qty 1

## 2012-01-26 NOTE — Progress Notes (Addendum)
Patient with orders to be discharge home. Discharge instructions given, patient verbalized understanding. Prescriptions given. Patient in stable condition upon discharge. Patient left with son via private vehicle.

## 2012-01-26 NOTE — Progress Notes (Signed)
Patient has been running sinus brady 46-50's on telemetry, called and made Dr. Onalee Hua aware, no new orders at this time

## 2012-01-26 NOTE — Discharge Summary (Signed)
Physician Discharge Summary  Debbie Webb:[id number] DOB: [birth year] DOA: 01/25/2012  PCP: Cassell Smiles., MD  Admit date: 01/25/2012 Discharge date: 01/26/2012  Recommendations for Outpatient Follow-up:  1. The patient was discharged to home in improved condition. She was advised to followup with her primary care physician in 3-4 days.  Discharge Diagnoses:  1. Chest pain. Myocardial infarction ruled out. Outpatient stress test and upper endoscopy are recommended. 2. Chronic sinus bradycardia. The patient's heart rate ranged from 47 beats per minute to 65 beats per minute. (TSH was within normal limits in March 2013). 3. Hypertension. 4. Possible GERD and esophageal diverticulum. 5. Hypokalemia. 6. History of stroke. 7. History of carotid artery stenosis. 8. History of hyperlipidemia. The patient's total fasting lipid profile revealed total cholesterol of 155, triglycerides of 72, HDL of 51, and LDL of 90.  Discharge Condition: Improved.   Diet recommendation: Heart healthy  Filed Weights   01/25/12 2150 01/26/12 0548  Weight: 63.277 kg (139 lb 8 oz) 63.05 kg (139 lb)    History of present illness:  The patient is a 78 year old woman with a history significant for a previous stroke, carotid artery stenosis, and hypertension, who presented to the emergency department on 01/25/2012 with a chief complaint of chest pain. In the emergency department, she was afebrile, hypertensive, and bradycardic. Her lab data were significant for a serum potassium of 3.2 and a normal troponin I. Her chest x-ray revealed no acute cardiopulmonary disease, but a possible esophageal diverticulum. Her EKG revealed sinus bradycardia with a heart rate of 50 beats per minute. She was admitted for further evaluation and management.   Hospital Course:  The patient was started on supportive treatment. Her chronic medications were continued. She had are ready been on aspirin, however, aspirin was added  empirically. Her pain was treated with as needed nitroglycerin and as needed opiate analgesics. She had no complaints of chest pain during the hospitalization. I did ask her she ever had a stress test and she said yes. She could not remember when it was nor could she recall the results, but the stress test was not within the past few years. She also acknowledges a history of acid reflux, but had apparently stopped taking her "acid medicine" sometime ago.  The patient remained bradycardic, but was asymptomatic from the bradycardia. Her heart rate ranged from 47-65 beats per minute. She was noted to have bradycardia during her previous hospitalization. At that time, her TSH was within normal limits. She reported that she had recently had blood work done at Dr. Sharyon Medicus office, but she had not been called with the results yet. Her fasting lipid profile was assessed during this hospitalization and the results were dictated above. She was maintained on statin therapy.  Because of her history of acid reflux and given that the chest x-ray was suggestive  of an esophageal diverticulum, Zantac was started.  The patient had no complaints of chest pain during the hospitalization. She remained afebrile and hemodynamically stable. She was discharged to home in improved and stable condition. Of note, Zantac was continued. Aspirin was discontinued. Potassium chloride was given prior to hospital discharge. She was continued on potassium chloride supplementation for hydrochlorothiazide-induced hypokalemia.  Procedures: None  Consultations: None  Discharge Exam: Filed Vitals:   01/25/12 1533 01/25/12 1803 01/25/12 2150 01/26/12 0548  BP: 190/51 152/91 148/57 168/57  Pulse: 54 57 50 47  Temp: 97.9 F (36.6 C)  97.4 F (36.3 C) 98.7 F (37.1 C)  TempSrc: Oral  Oral Oral  Resp: 18 16 18 18   Height:   5\' 4"  (1.626 m)   Weight:   63.277 kg (139 lb 8 oz) 63.05 kg (139 lb)  SpO2: 95% 97% 95% 96%    General:  pleasant alert 78 year old woman sitting up in the chair, eating breakfast. No acute distress.  Cardiovascular: S1, S2, with a 2/6 systolic murmur and mild bradycardia. Respiratory: clear to auscultation bilaterally.  Discharge Instructions  Discharge Orders    Future Orders Please Complete By Expires   Diet - low sodium heart healthy      Increase activity slowly          Medication List     As of 01/26/2012 12:33 PM    TAKE these medications         ALEVE 220 MG tablet   Generic drug: naproxen sodium   Take 220 mg by mouth as needed. For pain      amLODipine 5 MG tablet   Commonly known as: NORVASC   Take 5 mg by mouth daily.      CENTRUM SILVER ULTRA WOMENS Tabs   Take 1 tablet by mouth daily.      clopidogrel 75 MG tablet   Commonly known as: PLAVIX   Take 75 mg by mouth daily.      FOLIC ACID-VIT B6-VIT B12 PO   Place 1 tablet under the tongue daily. Unknown strength. Is a combination SL tablet      hydrochlorothiazide 25 MG tablet   Commonly known as: HYDRODIURIL   Take 25 mg by mouth daily.      oxybutynin 5 MG tablet   Commonly known as: DITROPAN   Take 2.5 mg by mouth 2 (two) times daily.      potassium chloride SA 15 MEQ tablet   Commonly known as: KLOR-CON M15   Take 2 tablets (30 mEq total) by mouth daily.      ranitidine 75 MG tablet   Commonly known as: ZANTAC   Take 1 tablet (75 mg total) by mouth 2 (two) times daily. For acid reflux.      sertraline 50 MG tablet   Commonly known as: ZOLOFT   Take 50 mg by mouth daily.      simvastatin 40 MG tablet   Commonly known as: ZOCOR   Take 40 mg by mouth every evening.      vitamin C 500 MG tablet   Commonly known as: ASCORBIC ACID   Take 500 mg by mouth daily.      VITAMIN E PO   Take 1 tablet by mouth daily. Unknown strength. OTC           Follow-up Information    Schedule an appointment as soon as possible for a visit with Cassell Smiles., MD.   Contact information:   [address] [place] [address] [phone]           The results of significant diagnostics from this hospitalization (including imaging, microbiology, ancillary and laboratory) are listed below for reference.    Significant Diagnostic Studies: Dg Chest 2 View  01/25/2012  *RADIOLOGY REPORT*  Clinical Data: Chest pain.  CHEST - 2 VIEW  Comparison: Portable chest x-ray 08/03/2011.  CTA chest 08/06/2011.  Findings: Cardiac silhouette mildly enlarged but stable.  Thoracic aorta atherosclerotic, unchanged.  Air-fluid level within the presumed esophageal diverticulum, based on the prior CT.   Lungs clear.  Bronchovascular markings normal.  Pulmonary vascularity normal.  No  pneumothorax.  No pleural effusions.  Degenerative changes involving the thoracic spine and generalized osteopenia.  IMPRESSION:  1.  Stable cardiomegaly.  No acute cardiopulmonary disease. 2.  Air-fluid level within what is likely an esophageal diverticulum, based on the prior CT.   Original Report Authenticated By: Arnell Sieving, M.D.     Microbiology: No results found for this or any previous visit (from the past 240 hour(s)).   Labs: Basic Metabolic Panel:  Lab 01/25/12 1610  NA 138  K 3.2*  CL 99  CO2 30  GLUCOSE 95  BUN 20  CREATININE 0.80  CALCIUM 10.5  MG --  PHOS --   Liver Function Tests: No results found for this basename: AST:5,ALT:5,ALKPHOS:5,BILITOT:5,PROT:5,ALBUMIN:5 in the last 168 hours No results found for this basename: LIPASE:5,AMYLASE:5 in the last 168 hours No results found for this basename: AMMONIA:5 in the last 168 hours CBC:  Lab 01/25/12 1644  WBC 7.2  NEUTROABS --  HGB 13.3  HCT 39.0  MCV 92.4  PLT 174   Cardiac Enzymes:  Lab 01/26/12 1105 01/26/12 0436 01/25/12 2230 01/25/12 1644  CKTOTAL -- -- -- --  CKMB -- -- -- --  CKMBINDEX -- -- -- --  TROPONINI <0.30 <0.30 <0.30 <0.30   BNP: BNP (last 3 results) No results found for this basename:  PROBNP:3 in the last 8760 hours CBG: No results found for this basename: GLUCAP:5 in the last 168 hours  Time coordinating discharge: 30  minutes  Signed:  Shreya Lacasse  [facility] 01/26/2012, 12:33 PM

## 2012-03-06 ENCOUNTER — Encounter: Payer: Self-pay | Admitting: Cardiology

## 2012-03-12 ENCOUNTER — Encounter: Payer: Self-pay | Admitting: Cardiology

## 2012-03-12 ENCOUNTER — Ambulatory Visit (INDEPENDENT_AMBULATORY_CARE_PROVIDER_SITE_OTHER): Payer: Medicare Other | Admitting: Cardiology

## 2012-03-12 VITALS — BP 120/60 | HR 71 | Wt 132.0 lb

## 2012-03-12 DIAGNOSIS — R0789 Other chest pain: Secondary | ICD-10-CM

## 2012-03-12 DIAGNOSIS — I10 Essential (primary) hypertension: Secondary | ICD-10-CM

## 2012-03-12 DIAGNOSIS — I779 Disorder of arteries and arterioles, unspecified: Secondary | ICD-10-CM

## 2012-03-12 NOTE — Assessment & Plan Note (Signed)
Blood pressure is normal today. 

## 2012-03-12 NOTE — Assessment & Plan Note (Signed)
Records reviewed regarding hospitalization in September. She had no evidence of ACS with overall nonspecific ECG. Echocardiogram from this March demonstrated normal LV systolic function without wall motion abnormality. She is not inclined to pursue followup stress testing at this point. Would like to request records from [facility] for review. Will observe for now and arrange follow up in 6 months, sooner if she manifests progressive symptoms.

## 2012-03-12 NOTE — Patient Instructions (Addendum)
Your physician recommends that you schedule a follow-up appointment in: 6 months  

## 2012-03-12 NOTE — Progress Notes (Signed)
Clinical Summary Debbie Webb is a 78 y.o.female referred for cardiology consultation by Dr. Sherwood Gambler. Record review finds hospital admission back in September for evaluation of chest pain. She ruled out for myocardial infarction, was noted to be bradycardic although asymptomatic. Medical therapy was pursued, and she was treated for concurrent acid reflux with findings suggesting esophageal diverticulum as well. It was recommended that she followup for outpatient GI and cardiac testing.  Lipid panel from September showed cholesterol 155, triglycerides 72, HDL 51, LDL 90. ECG review showing sinus bradycardia with nonspecific T wave changes, decreased R wave progression and increased lateral voltage.  She has previously been followed by [facility]. No records available at this time. Not clear based on her description if she has any formal diagnosis of CAD, she states she had a stress test 2 years ago. Echocardiogram in March demonstrated LVEF 55-60% with severe LVH, diastolic dysfunction, very mild aortic stenosis, mild mitral regurgitation, RVSP 34, trivial precordial effusion.  Patient denies having any recurring chest pain symptoms, states that she really had more of a shoulder discomfort and upper back pain at the time of her hospitalization. She says that she is not inclined to have followup stress testing at this time.  Carotid disease is followed by a physician in [place].   No Known Allergies  Current Outpatient Prescriptions  Medication Sig Dispense Refill  . amLODipine (NORVASC) 5 MG tablet Take 5 mg by mouth daily.      . clopidogrel (PLAVIX) 75 MG tablet Take 75 mg by mouth daily.      . fish oil-omega-3 fatty acids 1000 MG capsule Take 2 g by mouth daily.      . Flaxseed, Linseed, (FLAX SEEDS PO) Take 1 capsule by mouth daily.      Marland Kitchen FOLIC ACID-VIT B6-VIT B12 PO Place 1 tablet under the tongue daily. Unknown strength. Is a combination SL tablet      . hydrochlorothiazide (HYDRODIURIL)  25 MG tablet Take 25 mg by mouth daily.      . Multiple Vitamins-Minerals (CENTRUM SILVER ULTRA WOMENS) TABS Take 1 tablet by mouth daily.      . naproxen sodium (ALEVE) 220 MG tablet Take 220 mg by mouth as needed. For pain      . oxybutynin (DITROPAN) 5 MG tablet Take 2.5 mg by mouth 2 (two) times daily.       . sertraline (ZOLOFT) 50 MG tablet Take 50 mg by mouth daily.      . simvastatin (ZOCOR) 40 MG tablet Take 40 mg by mouth every evening.      . vitamin C (ASCORBIC ACID) 500 MG tablet Take 500 mg by mouth daily.      Marland Kitchen VITAMIN E PO Take 1 tablet by mouth daily. Unknown strength. OTC        Past Medical History  Diagnosis Date  . Stroke 1997  . Hemiparesis affecting left side as late effect of cerebrovascular accident 08/03/2011  . Tremor   . Carotid artery disease     Status post carotid endarterectomy x2  . Essential hypertension, benign     Past Surgical History  Procedure Date  . Carotid endarterectomy     Right    History reviewed. No pertinent family history.  Social History Ms. Grissett reports that she has never smoked. She does not have any smokeless tobacco history on file. Ms. Pearcy reports that she does not drink alcohol.  Review of Systems Denies palpitations or syncope. Uses a cane, denies any  falls. No bleeding episodes. Residual left-sided weakness. Otherwise negative except as outlined.  Physical Examination Filed Vitals:   03/12/12 0835  BP: 120/60  Pulse: 71   Filed Weights   03/12/12 0835  Weight: 132 lb (59.875 kg)   Patient in no acute distress. HEENT: Conjunctiva and lids normal, oropharynx clear. Neck: Supple, no elevated JVP, loud right carotid bruit, no thyromegaly. Lungs: Clear to auscultation, nonlabored breathing at rest. Cardiac: Regular rate and rhythm, no S3, 2/6 systolic murmur, no pericardial rub. Abdomen: Soft, nontender, bowel sounds present, no guarding or rebound. Extremities: No pitting edema, distal pulses 2+. Skin: Warm  and dry. Musculoskeletal: Mild kyphosis. Neuropsychiatric: Alert and oriented x3, affect grossly appropriate.   Problem List and Plan   Atypical chest pain Records reviewed regarding hospitalization in September. She had no evidence of ACS with overall nonspecific ECG. Echocardiogram from this March demonstrated normal LV systolic function without wall motion abnormality. She is not inclined to pursue followup stress testing at this point. Would like to request records from [facility] for review. Will observe for now and arrange follow up in 6 months, sooner if she manifests progressive symptoms.  Carotid artery disease Status post right carotid endarterectomy on 2 occasions. Has prominent bruit on the right. She states she is followed by a physician in [place].  Hypertension Blood pressure is normal today.    Jonelle Sidle, M.D., F.A.C.C.

## 2012-03-12 NOTE — Assessment & Plan Note (Signed)
Status post right carotid endarterectomy on 2 occasions. Has prominent bruit on the right. She states she is followed by a physician in [address].

## 2012-03-26 NOTE — Progress Notes (Signed)
UR Chart Review Completed  

## 2012-09-12 ENCOUNTER — Encounter: Payer: Self-pay | Admitting: Cardiology

## 2012-09-12 ENCOUNTER — Ambulatory Visit (INDEPENDENT_AMBULATORY_CARE_PROVIDER_SITE_OTHER): Payer: Medicare Other | Admitting: Cardiology

## 2012-09-12 VITALS — Ht 66.0 in | Wt 128.0 lb

## 2012-09-12 DIAGNOSIS — I350 Nonrheumatic aortic (valve) stenosis: Secondary | ICD-10-CM | POA: Insufficient documentation

## 2012-09-12 DIAGNOSIS — I359 Nonrheumatic aortic valve disorder, unspecified: Secondary | ICD-10-CM

## 2012-09-12 DIAGNOSIS — R0789 Other chest pain: Secondary | ICD-10-CM

## 2012-09-12 NOTE — Assessment & Plan Note (Signed)
Very mild by echocardiogram last year. Would follow clinically with primary MD for now.

## 2012-09-12 NOTE — Assessment & Plan Note (Signed)
No significant progression. Had negative Myoview in January 2011 with [facility]. ECG reviewed. No further cardiac testing now. Can foillowup as needed, keep regular visits with Dr. Sherwood Gambler.

## 2012-09-12 NOTE — Patient Instructions (Addendum)
Your physician recommends that you schedule a follow-up appointment in: AS NEEDED  

## 2012-09-12 NOTE — Progress Notes (Signed)
Clinical Summary Debbie Webb is a 79 y.o.female last seen in October 2013. She states that she has felt well, no recurring chest pain, no palpitations or syncope.  Records received from [facility] heart and vascular indicate prior Myoview study in January 2011 demonstrating no ischemia, LVEF 69%. She does have a history of very mild aortic stenosis based on previous echocardiogram in March of last year with mean gradient of 10 mm mercury.  ECG shows atrial bradycardia at 51 with NSST changes.  She still functions with ADLs. Uses a cane.  No Known Allergies  Current Outpatient Prescriptions  Medication Sig Dispense Refill  . amLODipine (NORVASC) 5 MG tablet Take 5 mg by mouth daily.      . clopidogrel (PLAVIX) 75 MG tablet Take 75 mg by mouth daily.      . fish oil-omega-3 fatty acids 1000 MG capsule Take 2 g by mouth daily.      . Flaxseed, Linseed, (FLAX SEEDS PO) Take 1 capsule by mouth daily.      Marland Kitchen FOLIC ACID-VIT B6-VIT B12 PO Place 1 tablet under the tongue daily. Unknown strength. Is a combination SL tablet      . hydrochlorothiazide (HYDRODIURIL) 25 MG tablet Take 25 mg by mouth daily.      . Multiple Vitamins-Minerals (CENTRUM SILVER ULTRA WOMENS) TABS Take 1 tablet by mouth daily.      . naproxen sodium (ALEVE) 220 MG tablet Take 220 mg by mouth as needed. For pain      . oxybutynin (DITROPAN) 5 MG tablet Take 5 mg by mouth 2 (two) times daily.       . sertraline (ZOLOFT) 50 MG tablet Take 50 mg by mouth daily.      . simvastatin (ZOCOR) 40 MG tablet Take 40 mg by mouth every evening.      . vitamin C (ASCORBIC ACID) 500 MG tablet Take 500 mg by mouth daily.      Marland Kitchen VITAMIN E PO Take 1 tablet by mouth daily. Unknown strength. OTC       No current facility-administered medications for this visit.    Past Medical History  Diagnosis Date  . Stroke 1997  . Hemiparesis affecting left side as late effect of cerebrovascular accident 08/03/2011  . Tremor   . Carotid artery  disease     Status post carotid endarterectomy x2  . Essential hypertension, benign     Social History Debbie Webb reports that she has never smoked. She does not have any smokeless tobacco history on file. Debbie Webb reports that she does not drink alcohol.  Review of Systems Negative except as outlined.  Physical Examination There were no vitals filed for this visit. Filed Weights   09/12/12 1425  Weight: 128 lb 0.6 oz (58.079 kg)    Patient in no acute distress.  HEENT: Conjunctiva and lids normal, oropharynx clear.  Neck: Supple, no elevated JVP, loud right carotid bruit, no thyromegaly.  Lungs: Clear to auscultation, nonlabored breathing at rest.  Cardiac: Regular rate and rhythm, no S3, 2/6 systolic murmur, no pericardial rub.  Abdomen: Soft, nontender, bowel sounds present, no guarding or rebound.  Extremities: No pitting edema, distal pulses 2+.  Skin: Warm and dry.  Musculoskeletal: Mild kyphosis.  Neuropsychiatric: Alert and oriented x3, affect grossly appropriate.   Problem List and Plan   Atypical chest pain No significant progression. Had negative Myoview in January 2011 with [facility]. ECG reviewed. No further cardiac testing now. Can foillowup as needed, keep regular  visits with Dr. Sherwood Gambler.  Aortic stenosis Very mild by echocardiogram last year. Would follow clinically with primary MD for now.    Jonelle Sidle, M.D., F.A.C.C.

## 2014-01-13 ENCOUNTER — Emergency Department (HOSPITAL_COMMUNITY)
Admission: EM | Admit: 2014-01-13 | Discharge: 2014-01-13 | Disposition: A | Discharge status: Home / Self Care | Payer: Medicare Other | Attending: Emergency Medicine | Admitting: Emergency Medicine

## 2014-01-13 ENCOUNTER — Encounter (HOSPITAL_COMMUNITY): Payer: Self-pay | Admitting: Emergency Medicine

## 2014-01-13 DIAGNOSIS — Z8673 Personal history of transient ischemic attack (TIA), and cerebral infarction without residual deficits: Secondary | ICD-10-CM | POA: Insufficient documentation

## 2014-01-13 DIAGNOSIS — I10 Essential (primary) hypertension: Secondary | ICD-10-CM | POA: Diagnosis not present

## 2014-01-13 DIAGNOSIS — IMO0002 Reserved for concepts with insufficient information to code with codable children: Secondary | ICD-10-CM | POA: Insufficient documentation

## 2014-01-13 DIAGNOSIS — Z79899 Other long term (current) drug therapy: Secondary | ICD-10-CM | POA: Insufficient documentation

## 2014-01-13 DIAGNOSIS — R21 Rash and other nonspecific skin eruption: Secondary | ICD-10-CM | POA: Insufficient documentation

## 2014-01-13 DIAGNOSIS — Z7902 Long term (current) use of antithrombotics/antiplatelets: Secondary | ICD-10-CM | POA: Insufficient documentation

## 2014-01-13 DIAGNOSIS — L259 Unspecified contact dermatitis, unspecified cause: Secondary | ICD-10-CM | POA: Diagnosis not present

## 2014-01-13 MED ORDER — PREDNISONE 50 MG PO TABS
60.0000 mg | ORAL_TABLET | Freq: Once | ORAL | Status: AC
  Administered 2014-01-13: 60 mg via ORAL
  Filled 2014-01-13 (×2): qty 1

## 2014-01-13 MED ORDER — PREDNISONE 10 MG PO TABS: ORAL_TABLET | ORAL | Status: DC

## 2014-01-13 NOTE — ED Provider Notes (Signed)
CSN: [id number]     Arrival date & time 01/13/14  1621 History   First MD Initiated Contact with Patient 01/13/14 1649     Chief Complaint  Patient presents with  . Poison Oak     (Consider location/radiation/quality/duration/timing/severity/associated sxs/prior Treatment) The history is provided by the patient.    Debbie Webb is a 80 y.o. female presenting with a rash which started on her ankles and has since spread to include her forearms since being exposed to a poison oak vine from a tree that was dropped by a neighbor onto her property.  The rash is itchy and was treated with ivarest, topical hydrocortisone and calamine lotion, and she thought it was improving, but now has popped up on her forearms and abdomen.  She denies fevers or chills, facial swelling, shortness of breath and denies any other complaints.       Past Medical History  Diagnosis Date  . Stroke 1997  . Hemiparesis affecting left side as late effect of cerebrovascular accident 08/03/2011  . Tremor   . Carotid artery disease     Status post carotid endarterectomy x2  . Essential hypertension, benign    Past Surgical History  Procedure Laterality Date  . Carotid endarterectomy      Right   No family history on file. History  Substance Use Topics  . Smoking status: Never Smoker   . Smokeless tobacco: Not on file  . Alcohol Use: No   OB History   Grav Para Term Preterm Abortions TAB SAB Ect Mult Living                 Review of Systems  Constitutional: Negative for fever and chills.  Respiratory: Negative for shortness of breath and wheezing.   Skin: Positive for rash.  Neurological: Negative for numbness.      Allergies  Review of patient's allergies indicates no known allergies.  Home Medications   Prior to Admission medications   Medication Sig Start Date End Date Taking? Authorizing Provider  amLODipine (NORVASC) 5 MG tablet Take 5 mg by mouth daily.    Historical Provider, MD   clopidogrel (PLAVIX) 75 MG tablet Take 75 mg by mouth daily.    Historical Provider, MD  fish oil-omega-3 fatty acids 1000 MG capsule Take 2 g by mouth daily.    Historical Provider, MD  Flaxseed, Linseed, (FLAX SEEDS PO) Take 1 capsule by mouth daily.    Historical Provider, MD  FOLIC ACID-VIT B6-VIT B12 PO Place 1 tablet under the tongue daily. Unknown strength. Is a combination SL tablet    Historical Provider, MD  hydrochlorothiazide (HYDRODIURIL) 25 MG tablet Take 25 mg by mouth daily.    Historical Provider, MD  Multiple Vitamins-Minerals (CENTRUM SILVER ULTRA WOMENS) TABS Take 1 tablet by mouth daily.    Historical Provider, MD  naproxen sodium (ALEVE) 220 MG tablet Take 220 mg by mouth as needed. For pain    Historical Provider, MD  oxybutynin (DITROPAN) 5 MG tablet Take 5 mg by mouth 2 (two) times daily.     Historical Provider, MD  predniSONE (DELTASONE) 10 MG tablet 6, 5, 4, 3, 2 then 1 tablet by mouth daily for 6 days total. 01/13/14   Burgess Amor, PA-C  sertraline (ZOLOFT) 50 MG tablet Take 50 mg by mouth daily.    Historical Provider, MD  simvastatin (ZOCOR) 40 MG tablet Take 40 mg by mouth every evening.    Historical Provider, MD  vitamin C (ASCORBIC ACID)  500 MG tablet Take 500 mg by mouth daily.    Historical Provider, MD  VITAMIN E PO Take 1 tablet by mouth daily. Unknown strength. OTC    Historical Provider, MD   BP 180/54  Pulse 60  Temp(Src) 98.6 F (37 C) (Oral)  Resp 16  Ht  (1.651 m)  SpO2 99% Physical Exam  Constitutional: She appears well-developed and well-nourished. No distress.  HENT:  Head: Normocephalic.  Neck: Neck supple.  Cardiovascular: Normal rate.   Pulmonary/Chest: Effort normal. She has no wheezes.  Musculoskeletal: Normal range of motion. She exhibits no edema.  Skin: Rash noted. Rash is papular and vesicular.  Mostly scabbed small papules bilateral ankles, appears to be old healing rash.  Macular lesion periumbilical with slightly raised  vesicles, no drainage, coated with calamine.  Scattered intact vesicles bilateral forearms.  Hands spared.    ED Course  Procedures (including critical care time) Labs Review Labs Reviewed - No data to display  Imaging Review No results found.   EKG Interpretation None      MDM   Final diagnoses:  Contact dermatitis    Pt was also seen by Dr. Manus Gunning during this visit. Pt was placed on short course of prednisone, encouraged continued calamine for any areas that drain.  Cool compresses.  Gold bond anti itch cream.  Advised to wash garden gloves, tool handles, etc.  Prn f/u anticipated.      Burgess Amor, PA-C 01/14/14 1337

## 2014-01-13 NOTE — Discharge Instructions (Signed)
Contact Dermatitis °Contact dermatitis is a reaction to certain substances that touch the skin. Contact dermatitis can be either irritant contact dermatitis or allergic contact dermatitis. Irritant contact dermatitis does not require previous exposure to the substance for a reaction to occur. Allergic contact dermatitis only occurs if you have been exposed to the substance before. Upon a repeat exposure, your body reacts to the substance.  °CAUSES  °Many substances can cause contact dermatitis. Irritant dermatitis is most commonly caused by repeated exposure to mildly irritating substances, such as: °· Makeup. °· Soaps. °· Detergents. °· Bleaches. °· Acids. °· Metal salts, such as nickel. °Allergic contact dermatitis is most commonly caused by exposure to: °· Poisonous plants. °· Chemicals (deodorants, shampoos). °· Jewelry. °· Latex. °· Neomycin in triple antibiotic cream. °· Preservatives in products, including clothing. °SYMPTOMS  °The area of skin that is exposed may develop: °· Dryness or flaking. °· Redness. °· Cracks. °· Itching. °· Pain or a burning sensation. °· Blisters. °With allergic contact dermatitis, there may also be swelling in areas such as the eyelids, mouth, or genitals.  °DIAGNOSIS  °Your caregiver can usually tell what the problem is by doing a physical exam. In cases where the cause is uncertain and an allergic contact dermatitis is suspected, a patch skin test may be performed to help determine the cause of your dermatitis. °TREATMENT °Treatment includes protecting the skin from further contact with the irritating substance by avoiding that substance if possible. Barrier creams, powders, and gloves may be helpful. Your caregiver may also recommend: °· Steroid creams or ointments applied 2 times daily. For best results, soak the rash area in cool water for 20 minutes. Then apply the medicine. Cover the area with a plastic wrap. You can store the steroid cream in the refrigerator for a "chilly"  effect on your rash. That may decrease itching. Oral steroid medicines may be needed in more severe cases. °· Antibiotics or antibacterial ointments if a skin infection is present. °· Antihistamine lotion or an antihistamine taken by mouth to ease itching. °· Lubricants to keep moisture in your skin. °· Burow's solution to reduce redness and soreness or to dry a weeping rash. Mix one packet or tablet of solution in 2 cups cool water. Dip a clean washcloth in the mixture, wring it out a bit, and put it on the affected area. Leave the cloth in place for 30 minutes. Do this as often as possible throughout the day. °· Taking several cornstarch or baking soda baths daily if the area is too large to cover with a washcloth. °Harsh chemicals, such as alkalis or acids, can cause skin damage that is like a burn. You should flush your skin for 15 to 20 minutes with cold water after such an exposure. You should also seek immediate medical care after exposure. Bandages (dressings), antibiotics, and pain medicine may be needed for severely irritated skin.  °HOME CARE INSTRUCTIONS °· Avoid the substance that caused your reaction. °· Keep the area of skin that is affected away from hot water, soap, sunlight, chemicals, acidic substances, or anything else that would irritate your skin. °· Do not scratch the rash. Scratching may cause the rash to become infected. °· You may take cool baths to help stop the itching. °· Only take over-the-counter or prescription medicines as directed by your caregiver. °· See your caregiver for follow-up care as directed to make sure your skin is healing properly. °SEEK MEDICAL CARE IF:  °· Your condition is not better after 3   days of treatment.  You seem to be getting worse.  You see signs of infection such as swelling, tenderness, redness, soreness, or warmth in the affected area.  You have any problems related to your medicines. Document Released: 04/27/2000 Document Revised: 07/23/2011  Document Reviewed: 10/03/2010 [facility] Patient Information 2015 [place], [place]. This information is not intended to replace advice given to you by your health care provider. Make sure you discuss any questions you have with your health care provider.   Take your next dose of prednisone tomorrow evening.  Cool compresses can help with itching, so can Gold Bond anti-itch cream as discussed.  You may continue taking benadryl also for itch as needed.

## 2014-01-13 NOTE — ED Notes (Signed)
Poison oak to legs, arms, and trunk x 1 wk.

## 2014-01-13 NOTE — ED Notes (Signed)
nad noted prior to dc. Dc instructions reviewed and explained. Voiced understanding.  

## 2014-01-14 NOTE — ED Provider Notes (Signed)
Medical screening examination/treatment/procedure(s) were conducted as a shared visit with non-physician practitioner(s) and myself.  I personally evaluated the patient during the encounter.  Contact dermatitis versus bug bites.  No intertrigenous lesions. No fever. Scabbed papules to bilateral lower legs. No streaking or erythema.   EKG Interpretation None        Glynn Octave, MD 01/14/14 1444

## 2014-10-11 DIAGNOSIS — I10 Essential (primary) hypertension: Secondary | ICD-10-CM | POA: Diagnosis not present

## 2014-10-11 DIAGNOSIS — Z6822 Body mass index (BMI) 22.0-22.9, adult: Secondary | ICD-10-CM | POA: Diagnosis not present

## 2014-10-11 DIAGNOSIS — M1991 Primary osteoarthritis, unspecified site: Secondary | ICD-10-CM | POA: Diagnosis not present

## 2015-03-28 ENCOUNTER — Encounter (HOSPITAL_COMMUNITY): Payer: Self-pay

## 2015-03-28 ENCOUNTER — Observation Stay (HOSPITAL_COMMUNITY)
Admission: EM | Admit: 2015-03-28 | Discharge: 2015-03-30 | Disposition: A | Discharge status: Home / Self Care | Payer: Medicare Other | Attending: Internal Medicine | Admitting: Internal Medicine

## 2015-03-28 DIAGNOSIS — I350 Nonrheumatic aortic (valve) stenosis: Secondary | ICD-10-CM

## 2015-03-28 DIAGNOSIS — W1839XA Other fall on same level, initial encounter: Secondary | ICD-10-CM | POA: Diagnosis not present

## 2015-03-28 DIAGNOSIS — Y9389 Activity, other specified: Secondary | ICD-10-CM | POA: Diagnosis not present

## 2015-03-28 DIAGNOSIS — Y9289 Other specified places as the place of occurrence of the external cause: Secondary | ICD-10-CM | POA: Diagnosis not present

## 2015-03-28 DIAGNOSIS — B330 Epidemic myalgia: Secondary | ICD-10-CM | POA: Insufficient documentation

## 2015-03-28 DIAGNOSIS — T796XXD Traumatic ischemia of muscle, subsequent encounter: Secondary | ICD-10-CM

## 2015-03-28 DIAGNOSIS — I739 Peripheral vascular disease, unspecified: Secondary | ICD-10-CM

## 2015-03-28 DIAGNOSIS — W19XXXS Unspecified fall, sequela: Secondary | ICD-10-CM | POA: Diagnosis not present

## 2015-03-28 DIAGNOSIS — Z8673 Personal history of transient ischemic attack (TIA), and cerebral infarction without residual deficits: Secondary | ICD-10-CM | POA: Diagnosis not present

## 2015-03-28 DIAGNOSIS — Z043 Encounter for examination and observation following other accident: Principal | ICD-10-CM | POA: Insufficient documentation

## 2015-03-28 DIAGNOSIS — E860 Dehydration: Secondary | ICD-10-CM | POA: Diagnosis not present

## 2015-03-28 DIAGNOSIS — W19XXXA Unspecified fall, initial encounter: Secondary | ICD-10-CM | POA: Diagnosis present

## 2015-03-28 DIAGNOSIS — M6282 Rhabdomyolysis: Secondary | ICD-10-CM | POA: Diagnosis present

## 2015-03-28 DIAGNOSIS — Z7902 Long term (current) use of antithrombotics/antiplatelets: Secondary | ICD-10-CM | POA: Diagnosis not present

## 2015-03-28 DIAGNOSIS — Y998 Other external cause status: Secondary | ICD-10-CM | POA: Diagnosis not present

## 2015-03-28 DIAGNOSIS — R748 Abnormal levels of other serum enzymes: Secondary | ICD-10-CM

## 2015-03-28 DIAGNOSIS — I779 Disorder of arteries and arterioles, unspecified: Secondary | ICD-10-CM | POA: Diagnosis not present

## 2015-03-28 DIAGNOSIS — R404 Transient alteration of awareness: Secondary | ICD-10-CM | POA: Diagnosis not present

## 2015-03-28 DIAGNOSIS — Z79899 Other long term (current) drug therapy: Secondary | ICD-10-CM | POA: Insufficient documentation

## 2015-03-28 DIAGNOSIS — I10 Essential (primary) hypertension: Secondary | ICD-10-CM | POA: Diagnosis not present

## 2015-03-28 DIAGNOSIS — I69354 Hemiplegia and hemiparesis following cerebral infarction affecting left non-dominant side: Secondary | ICD-10-CM

## 2015-03-28 DIAGNOSIS — R531 Weakness: Secondary | ICD-10-CM | POA: Diagnosis not present

## 2015-03-28 LAB — COMPREHENSIVE METABOLIC PANEL
ALBUMIN: 4 g/dL (ref 3.5–5.0)
ALK PHOS: 84 U/L (ref 38–126)
ALT: 34 U/L (ref 14–54)
ANION GAP: 13 (ref 5–15)
AST: 71 U/L — AB (ref 15–41)
BUN: 28 mg/dL — AB (ref 6–20)
CALCIUM: 10 mg/dL (ref 8.9–10.3)
CO2: 22 mmol/L (ref 22–32)
CREATININE: 0.89 mg/dL (ref 0.44–1.00)
Chloride: 104 mmol/L (ref 101–111)
GFR calc Af Amer: >60 mL/min (ref >60)
GFR calc non Af Amer: 55 mL/min — ABNORMAL LOW (ref >60)
GLUCOSE: 76 mg/dL (ref 65–99)
Potassium: 3.9 mmol/L (ref 3.5–5.1)
SODIUM: 139 mmol/L (ref 135–145)
Total Bilirubin: 1.1 mg/dL (ref 0.3–1.2)
Total Protein: 6.8 g/dL (ref 6.5–8.1)

## 2015-03-28 LAB — CBC WITH DIFFERENTIAL/PLATELET
BASOS PCT: 0 %
Basophils Absolute: 0 10*3/uL (ref 0.0–0.1)
EOS PCT: 0 %
Eosinophils Absolute: 0 10*3/uL (ref 0.0–0.7)
HEMATOCRIT: 40.9 % (ref 36.0–46.0)
Hemoglobin: 14.3 g/dL (ref 12.0–15.0)
Lymphocytes Relative: 4 %
Lymphs Abs: 0.7 10*3/uL (ref 0.7–4.0)
MCH: 32.9 pg (ref 26.0–34.0)
MCHC: 35 g/dL (ref 30.0–36.0)
MCV: 94 fL (ref 78.0–100.0)
MONO ABS: 1.3 10*3/uL — AB (ref 0.1–1.0)
MONOS PCT: 7 %
NEUTROS ABS: 15.5 10*3/uL — AB (ref 1.7–7.7)
Neutrophils Relative %: 89 %
Platelets: 154 10*3/uL (ref 150–400)
RBC: 4.35 MIL/uL (ref 3.87–5.11)
RDW: 13.2 % (ref 11.5–15.5)
WBC: 17.5 10*3/uL — ABNORMAL HIGH (ref 4.0–10.5)

## 2015-03-28 LAB — CK: Total CK: 953 U/L — ABNORMAL HIGH (ref 38–234)

## 2015-03-28 LAB — I-STAT CG4 LACTIC ACID, ED: Lactic Acid, Venous: 1.1 mmol/L (ref 0.5–2.0)

## 2015-03-28 MED ORDER — SODIUM CHLORIDE 0.9 % IV SOLN
INTRAVENOUS | Status: DC
  Administered 2015-03-29 (×2): via INTRAVENOUS

## 2015-03-28 MED ORDER — SERTRALINE HCL 50 MG PO TABS
50.0000 mg | ORAL_TABLET | Freq: Every day | ORAL | Status: DC
  Administered 2015-03-29 – 2015-03-30 (×2): 50 mg via ORAL
  Filled 2015-03-28 (×2): qty 1

## 2015-03-28 MED ORDER — HEPARIN SODIUM (PORCINE) 5000 UNIT/ML IJ SOLN
5000.0000 [IU] | Freq: Three times a day (TID) | INTRAMUSCULAR | Status: DC
  Administered 2015-03-29 – 2015-03-30 (×3): 5000 [IU] via SUBCUTANEOUS
  Filled 2015-03-28 (×3): qty 1

## 2015-03-28 MED ORDER — OXYBUTYNIN CHLORIDE 5 MG PO TABS: 2.5000 mg | ORAL_TABLET | Freq: Every day | ORAL | Status: DC | PRN

## 2015-03-28 MED ORDER — AMLODIPINE BESYLATE 5 MG PO TABS
5.0000 mg | ORAL_TABLET | Freq: Every day | ORAL | Status: DC
  Administered 2015-03-29 – 2015-03-30 (×2): 5 mg via ORAL
  Filled 2015-03-28 (×2): qty 1

## 2015-03-28 MED ORDER — SODIUM CHLORIDE 0.9 % IV BOLUS (SEPSIS)
500.0000 mL | Freq: Once | INTRAVENOUS | Status: AC
  Administered 2015-03-28: 500 mL via INTRAVENOUS

## 2015-03-28 MED ORDER — CLOPIDOGREL BISULFATE 75 MG PO TABS
75.0000 mg | ORAL_TABLET | Freq: Every day | ORAL | Status: DC
  Administered 2015-03-29 – 2015-03-30 (×2): 75 mg via ORAL
  Filled 2015-03-28 (×2): qty 1

## 2015-03-28 MED ORDER — ENSURE ENLIVE PO LIQD
237.0000 mL | Freq: Two times a day (BID) | ORAL | Status: DC
  Administered 2015-03-29 – 2015-03-30 (×2): 237 mL via ORAL
  Filled 2015-03-28 (×3): qty 237

## 2015-03-28 MED ORDER — SIMVASTATIN 20 MG PO TABS: 40.0000 mg | ORAL_TABLET | Freq: Every evening | ORAL | Status: DC

## 2015-03-28 MED ORDER — VITAMIN C 500 MG PO TABS
500.0000 mg | ORAL_TABLET | Freq: Every day | ORAL | Status: DC
  Administered 2015-03-29 – 2015-03-30 (×2): 500 mg via ORAL
  Filled 2015-03-28 (×2): qty 1

## 2015-03-28 NOTE — H&P (Signed)
[facility] History and Physical  KAYAH HECKER ZOX:[id number] DOB: [birth year]    PCP:   Cassell Smiles., MD   Chief Complaint: mild rhabdomyolysis from a mechanical fall with no injury.  HPI: Debbie Webb is an 81 y.o. female with hx of prior CVA and left hemiparesis, lives alone, hx of tremor, hx of carotid stenosis, s/p CEA on Plavix, HTN, fell as she was picking up a heavy object, and was on the floor for a couple of days.  Her son found her and brought her here.  She has not hit her head.  Work up showed her CPK was 953 with normal Cr. Her WBC was 17K with no clinical evidence of infection.  Hospitalist was asked to admit her for OBS in fear of further elevation of her CPK and to be sure she doesn't go into AKI.   Rewiew of Systems:  Constitutional: Negative for malaise, fever and chills. No significant weight loss or weight gain Eyes: Negative for eye pain, redness and discharge, diplopia, visual changes, or flashes of light. ENMT: Negative for ear pain, hoarseness, nasal congestion, sinus pressure and sore throat. No headaches; tinnitus, drooling, or problem swallowing. Cardiovascular: Negative for chest pain, palpitations, diaphoresis, dyspnea and peripheral edema. ; No orthopnea, PND Respiratory: Negative for cough, hemoptysis, wheezing and stridor. No pleuritic chestpain. Gastrointestinal: Negative for nausea, vomiting, diarrhea, constipation, abdominal pain, melena, blood in stool, hematemesis, jaundice and rectal bleeding.    Genitourinary: Negative for frequency, dysuria, incontinence,flank pain and hematuria; Musculoskeletal: Negative for back pain and neck pain. Negative for swelling and trauma.;  Skin: . Negative for pruritus, rash, abrasions, bruising and skin lesion.; ulcerations Neuro: Negative for headache, lightheadedness and neck stiffness. Negative for weakness, altered level of consciousness , altered mental status, extremity weakness, burning feet, involuntary  movement, seizure and syncope.  Psych: negative for anxiety, depression, insomnia, tearfulness, panic attacks, hallucinations, paranoia, suicidal or homicidal ideation    Past Medical History  Diagnosis Date  . Stroke (HCC) 1997  . Hemiparesis affecting left side as late effect of cerebrovascular accident (HCC) 08/03/2011  . Tremor   . Carotid artery disease (HCC)     Status post carotid endarterectomy x2  . Essential hypertension, benign     Past Surgical History  Procedure Laterality Date  . Carotid endarterectomy      Right    Medications:  HOME MEDS: Prior to Admission medications   Medication Sig Start Date End Date Taking? Authorizing Provider  amLODipine (NORVASC) 5 MG tablet Take 5 mg by mouth daily.   Yes Historical Provider, MD  clopidogrel (PLAVIX) 75 MG tablet Take 75 mg by mouth daily.   Yes Historical Provider, MD  ENSURE (ENSURE) Take 237 mLs by mouth 2 (two) times daily between meals.   Yes Historical Provider, MD  hydrochlorothiazide (HYDRODIURIL) 25 MG tablet Take 25 mg by mouth daily.   Yes Historical Provider, MD  oxybutynin (DITROPAN) 5 MG tablet Take 2.5 mg by mouth daily as needed for bladder spasms.    Yes Historical Provider, MD  sertraline (ZOLOFT) 50 MG tablet Take 50 mg by mouth daily.   Yes Historical Provider, MD  simvastatin (ZOCOR) 40 MG tablet Take 40 mg by mouth every evening.   Yes Historical Provider, MD  vitamin B-12 (CYANOCOBALAMIN) 100 MCG tablet Take 100 mcg by mouth daily.   Yes Historical Provider, MD  vitamin C (ASCORBIC ACID) 500 MG tablet Take 500 mg by mouth daily.   Yes Historical Provider, MD  naproxen sodium (ALEVE) 220 MG tablet Take 220 mg by mouth as needed. For pain    Historical Provider, MD     Allergies:  No Known Allergies  Social History:   reports that she has never smoked. She does not have any smokeless tobacco history on file. She reports that she does not drink alcohol or use illicit drugs.  Family  History: History reviewed. No pertinent family history.   Physical Exam: Filed Vitals:   03/28/15 2024 03/28/15 2030 03/28/15 2100 03/28/15 2130  BP: 150/52 163/52 173/55 146/51  Pulse: 65 64 70 85  Temp:      TempSrc:      Resp: 16     Height:      Weight:      SpO2: 98% 98% 96% 96%   Blood pressure 146/51, pulse 85, temperature 97.8 F (36.6 C), temperature source Oral, resp. rate 16, height  (1.626 m), weight 58.06 kg (128 lb), SpO2 96 %.  GEN:  Pleasant patient lying in the stretcher in no acute distress; cooperative with exam. PSYCH:  alert and oriented x4; does not appear anxious or depressed; affect is appropriate. HEENT: Mucous membranes pink and anicteric; PERRLA; EOM intact; no cervical lymphadenopathy nor thyromegaly or carotid bruit; no JVD; There were no stridor. Neck is very supple. Breasts:: Not examined CHEST WALL: No tenderness CHEST: Normal respiration, clear to auscultation bilaterally.  HEART: Regular rate and rhythm.  There are no murmur, rub, or gallops.   BACK: No kyphosis or scoliosis; no CVA tenderness ABDOMEN: soft and non-tender; no masses, no organomegaly, normal abdominal bowel sounds; no pannus; no intertriginous candida. There is no rebound and no distention. Rectal Exam: Not done EXTREMITIES: No bone or joint deformity; age-appropriate arthropathy of the hands and knees; no edema; no ulcerations.  There is no calf tenderness. Genitalia: not examined PULSES: 2+ and symmetric SKIN: Normal hydration no rash or ulceration CNS: Cranial nerves 2-12 grossly intact no focal lateralizing neurologic deficit.  Speech is fluent; uvula elevated with phonation, facial symmetry and tongue midline. DTR are normal bilaterally, cerebella exam is intact, barbinski is negative and strengths are equaled bilaterally.  No sensory loss.   Labs on Admission:  Basic Metabolic Panel:  Recent Labs Lab 03/28/15 1933  NA 139  K 3.9  CL 104  CO2 22  GLUCOSE 76  BUN  28*  CREATININE 0.89  CALCIUM 10.0   Liver Function Tests:  Recent Labs Lab 03/28/15 1933  AST 71*  ALT 34  ALKPHOS 84  BILITOT 1.1  PROT 6.8  ALBUMIN 4.0   CBC:  Recent Labs Lab 03/28/15 1933  WBC 17.5*  NEUTROABS 15.5*  HGB 14.3  HCT 40.9  MCV 94.0  PLT 154   Cardiac Enzymes:  Recent Labs Lab 03/28/15 1933  CKTOTAL 953*   Assessment/Plan Present on Admission:  . Fall . Carotid artery disease (HCC) . Rhabdomyolysis   PLAN: Will admit her for mild rhabdomyolysis.  Given her age, will give only 50cc per hour for IVF.   Recheck CPK in am, and renal fx test.  I will hold NSAIDS and diuretics.   I have ordered PT to assess her gait stability.  Her WBC is elevated, with no clinical evidence of infection, will repeat, and check UA.   She has no coughs or any pulmonary symptoms.  If persists, will advised further work up.   Other plans as per orders.  Code Status:FULL CODE.    Houston Siren, MD. [facility] Pager  [phone] 7pm to 7am.  03/28/2015, 10:13 PM

## 2015-03-28 NOTE — ED Notes (Signed)
Assisted pt to bedside commode and [facility] Pt put toilet in bucket. Unable to use specimen

## 2015-03-28 NOTE — ED Provider Notes (Signed)
CSN: [id number]     Arrival date & time 03/28/15  1742 History   First MD Initiated Contact with Patient 03/28/15 1930     Chief Complaint  Patient presents with  . Fall     (Consider location/radiation/quality/duration/timing/severity/associated sxs/prior Treatment) Patient is a 81 y.o. female presenting with fall. The history is provided by the patient.  Fall This is a new problem. Pertinent negatives include no abdominal pain and no shortness of breath.   2 days ago the patient fell while she was trying to pick up a plant outside. States that since her previous strokes been able to get up if she falls. She was able to pull herself inside the house but has been on the floor since. Found by her son today. States she's been able to eat or drink. States she just aches all over but no real pain anywhere else. Did not hit her head. No hip or back pain.  Past Medical History  Diagnosis Date  . Stroke (HCC) 1997  . Hemiparesis affecting left side as late effect of cerebrovascular accident (HCC) 08/03/2011  . Tremor   . Carotid artery disease (HCC)     Status post carotid endarterectomy x2  . Essential hypertension, benign    Past Surgical History  Procedure Laterality Date  . Carotid endarterectomy      Right   No family history on file. Social History  Substance Use Topics  . Smoking status: Never Smoker   . Smokeless tobacco: None  . Alcohol Use: No   OB History    No data available     Review of Systems  Constitutional: Negative for fever and appetite change.  Respiratory: Negative for shortness of breath.   Cardiovascular: Negative for leg swelling.  Gastrointestinal: Negative for abdominal pain.  Genitourinary: Negative for hematuria.  Musculoskeletal: Positive for myalgias.  Skin: Negative for rash.  Neurological: Positive for weakness.      Allergies  Review of patient's allergies indicates no known allergies.  Home Medications   Prior to Admission  medications   Medication Sig Start Date End Date Taking? Authorizing Provider  amLODipine (NORVASC) 5 MG tablet Take 5 mg by mouth daily.   Yes Historical Provider, MD  clopidogrel (PLAVIX) 75 MG tablet Take 75 mg by mouth daily.   Yes Historical Provider, MD  ENSURE (ENSURE) Take 237 mLs by mouth 2 (two) times daily between meals.   Yes Historical Provider, MD  hydrochlorothiazide (HYDRODIURIL) 25 MG tablet Take 25 mg by mouth daily.   Yes Historical Provider, MD  oxybutynin (DITROPAN) 5 MG tablet Take 2.5 mg by mouth daily as needed for bladder spasms.    Yes Historical Provider, MD  sertraline (ZOLOFT) 50 MG tablet Take 50 mg by mouth daily.   Yes Historical Provider, MD  simvastatin (ZOCOR) 40 MG tablet Take 40 mg by mouth every evening.   Yes Historical Provider, MD  vitamin B-12 (CYANOCOBALAMIN) 100 MCG tablet Take 100 mcg by mouth daily.   Yes Historical Provider, MD  vitamin C (ASCORBIC ACID) 500 MG tablet Take 500 mg by mouth daily.   Yes Historical Provider, MD  naproxen sodium (ALEVE) 220 MG tablet Take 220 mg by mouth as needed. For pain    Historical Provider, MD   BP 163/52 mmHg  Pulse 64  Temp(Src) 97.8 F (36.6 C) (Oral)  Resp 16  Ht 5\' 4"  (1.626 m)  Wt 128 lb (58.06 kg)  BMI 21.96 kg/m2  SpO2 98% Physical Exam  Constitutional:  She appears well-developed.  HENT:  Head: Atraumatic.  Mucous membranes are dry  Eyes: EOM are normal.  Neck: Neck supple.  Cardiovascular: Normal rate and regular rhythm.   Pulmonary/Chest: Effort normal.  Abdominal: Soft.  Musculoskeletal: She exhibits no tenderness.  No tenderness over back or extremities. Good range of motion in hips  Neurological: She is alert.  Skin: Skin is warm.    ED Course  Procedures (including critical care time) Labs Review Labs Reviewed  COMPREHENSIVE METABOLIC PANEL - Abnormal; Notable for the following:    BUN 28 (*)    AST 71 (*)    GFR calc non Af Amer 55 (*)    All other components within normal  limits  CBC WITH DIFFERENTIAL/PLATELET - Abnormal; Notable for the following:    WBC 17.5 (*)    Neutro Abs 15.5 (*)    Monocytes Absolute 1.3 (*)    All other components within normal limits  CK - Abnormal; Notable for the following:    Total CK 953 (*)    All other components within normal limits  URINALYSIS, ROUTINE W REFLEX MICROSCOPIC (NOT AT [facility])  I-STAT CG4 LACTIC ACID, ED    Imaging Review No results found. I have personally reviewed and evaluated these images and lab results as part of my medical decision-making.   EKG Interpretation None      MDM   Final diagnoses:  Fall, initial encounter  Elevated CK  Dehydration    Patient is laid on the floor for 2 days. CK is elevated at 950. Maybe going out. White count is elevated but urine is still pending. BUN is mildly elevated but creatinine is still normal. Will admit to internal medicine for observation.    Benjiman Core, MD 03/28/15 [phone]

## 2015-03-28 NOTE — ED Notes (Signed)
Pt brought in by EMS. Pt went outside to get a fern. Stumbled over leaves and fell. Laid in basement floor since Saturday approx. 9pm until found by son today at 5pm. Denies pain, only soreness. Pt is alert and oriented. Answers questions appropriately

## 2015-03-29 DIAGNOSIS — T796XXS Traumatic ischemia of muscle, sequela: Secondary | ICD-10-CM

## 2015-03-29 LAB — URINALYSIS, ROUTINE W REFLEX MICROSCOPIC
GLUCOSE, UA: NEGATIVE mg/dL
KETONES UR: 40 mg/dL — AB
LEUKOCYTES UA: NEGATIVE
Nitrite: NEGATIVE
PROTEIN: 30 mg/dL — AB
Specific Gravity, Urine: >1.03 — ABNORMAL HIGH (ref 1.005–1.030)
Urobilinogen, UA: 0.2 mg/dL (ref 0.0–1.0)
pH: 6 (ref 5.0–8.0)

## 2015-03-29 LAB — URINE MICROSCOPIC-ADD ON
Squamous Epithelial / LPF: NONE SEEN
WBC UA: NONE SEEN WBC/hpf (ref 0–5)

## 2015-03-29 LAB — CBC WITH DIFFERENTIAL/PLATELET
Basophils Absolute: 0 10*3/uL (ref 0.0–0.1)
Basophils Relative: 0 %
EOS PCT: 1 %
Eosinophils Absolute: 0.2 10*3/uL (ref 0.0–0.7)
HEMATOCRIT: 37.9 % (ref 36.0–46.0)
Hemoglobin: 12.8 g/dL (ref 12.0–15.0)
LYMPHS ABS: 1.3 10*3/uL (ref 0.7–4.0)
LYMPHS PCT: 10 %
MCH: 31.8 pg (ref 26.0–34.0)
MCHC: 33.8 g/dL (ref 30.0–36.0)
MCV: 94.3 fL (ref 78.0–100.0)
MONO ABS: 1.1 10*3/uL — AB (ref 0.1–1.0)
MONOS PCT: 8 %
NEUTROS ABS: 10.6 10*3/uL — AB (ref 1.7–7.7)
Neutrophils Relative %: 81 %
PLATELETS: 171 10*3/uL (ref 150–400)
RBC: 4.02 MIL/uL (ref 3.87–5.11)
RDW: 13.4 % (ref 11.5–15.5)
WBC: 13.2 10*3/uL — ABNORMAL HIGH (ref 4.0–10.5)

## 2015-03-29 LAB — BASIC METABOLIC PANEL
ANION GAP: 10 (ref 5–15)
BUN: 28 mg/dL — AB (ref 6–20)
CALCIUM: 9.7 mg/dL (ref 8.9–10.3)
CO2: 23 mmol/L (ref 22–32)
CREATININE: 0.9 mg/dL (ref 0.44–1.00)
Chloride: 107 mmol/L (ref 101–111)
GFR calc Af Amer: >60 mL/min (ref >60)
GFR, EST NON AFRICAN AMERICAN: 54 mL/min — AB (ref >60)
GLUCOSE: 80 mg/dL (ref 65–99)
Potassium: 3.4 mmol/L — ABNORMAL LOW (ref 3.5–5.1)
Sodium: 140 mmol/L (ref 135–145)

## 2015-03-29 LAB — CK: Total CK: 393 U/L — ABNORMAL HIGH (ref 38–234)

## 2015-03-29 MED ORDER — ATORVASTATIN CALCIUM 20 MG PO TABS
20.0000 mg | ORAL_TABLET | Freq: Every day | ORAL | Status: DC
  Administered 2015-03-29: 20 mg via ORAL
  Filled 2015-03-29: qty 1

## 2015-03-29 NOTE — Evaluation (Signed)
Physical Therapy Evaluation Patient Details Name: Debbie Webb MRN: [id number] DOB: 1921/05/14 Today's Date: 03/29/2015   History of Present Illness  Debbie Webb is an 81 y.o. female with hx of prior CVA and left hemiparesis, lives alone, hx of tremor, hx of carotid stenosis, s/p CEA on Plavix, HTN, fell as she was picking up a heavy object, and was on the floor for a couple of days. Her son found her and brought her here. She has not hit her head. Work up showed her CPK was 953 with normal Cr. Her WBC was 17K with no clinical evidence of infection. Hospitalist was asked to admit her for OBS in fear of further elevation of her CPK and to be sure she doesn't go into AKI.   Clinical Impression   Pt was seen for evaluation.  She was very alert and oriented, cooperative.  She is fiercly independent at home with a walker and admits to "I do some things that I know I shouldn't do".  Her son lives next door. She reports feeling very well with no significant muscle soreness.  She is basically at functional baseline and is very stable in gait with a cane.  I have recommended that she get a "life line" at home and she is in agreement.  Otherwise she is just going to slowly increase her activities at home.    Follow Up Recommendations No PT follow up    Equipment Recommendations  None recommended by PT (needs a lifeline alert)    Recommendations for Other Services   none    Precautions / Restrictions Precautions Precautions: Fall Restrictions Weight Bearing Restrictions: No      Mobility  Bed Mobility Overal bed mobility: Needs Assistance Bed Mobility: Supine to Sit     Supine to sit: Mod assist     General bed mobility comments: Pt states that she normally sleeps on a sofa and has her own method for going from supine to sit  Transfers Overall transfer level: Needs assistance Equipment used: Rolling walker (2 wheeled) Transfers: Sit to/from Stand Sit to Stand: Supervision             Ambulation/Gait Ambulation/Gait assistance: Supervision Ambulation Distance (Feet): 150 Feet Assistive device: Rolling walker (2 wheeled) Gait Pattern/deviations: Decreased dorsiflexion - left   Gait velocity interpretation: >2.62 ft/sec, indicative of independent community ambulator General Gait Details: holds LLE into external rotation during gait  Stairs            Wheelchair Mobility    Modified Rankin (Stroke Patients Only)       Balance Overall balance assessment: Needs assistance;History of Falls Sitting-balance support: No upper extremity supported;Feet supported Sitting balance-Leahy Scale: Normal     Standing balance support: No upper extremity supported Standing balance-Leahy Scale: Fair                               Pertinent Vitals/Pain Pain Assessment: No/denies pain    Home Living Family/patient expects to be discharged to:: Private residence Living Arrangements: Alone Available Help at Discharge: Family;Available PRN/intermittently Type of Home: House Home Access: Level entry     Home Layout: Two level;Able to live on main level with bedroom/bathroom Home Equipment: Dan Humphreys - 2 wheels;Cane - single point;Bedside commode      Prior Function Level of Independence: Independent with assistive device(s)               Hand Dominance  Dominant Hand: Right    Extremity/Trunk Assessment               Lower Extremity Assessment: LLE deficits/detail   LLE Deficits / Details: strength generally 3/5  Cervical / Trunk Assessment: Kyphotic  Communication   Communication: HOH  Cognition Arousal/Alertness: Awake/alert Behavior During Therapy: WFL for tasks assessed/performed Overall Cognitive Status: Within Functional Limits for tasks assessed                      General Comments      Exercises        Assessment/Plan    PT Assessment Patent does not need any further PT services  PT  Diagnosis     PT Problem List    PT Treatment Interventions     PT Goals (Current goals can be found in the Care Plan section) Acute Rehab PT Goals PT Goal Formulation: All assessment and education complete, DC therapy    Frequency     Barriers to discharge        Co-evaluation               End of Session Equipment Utilized During Treatment: Gait belt Activity Tolerance: Patient tolerated treatment well Patient left: in chair;with call bell/phone within reach;with family/visitor present Nurse Communication: Mobility status    Functional Assessment Tool Used: clinical judgement Functional Limitation: Mobility: Walking and moving around Mobility: Walking and Moving Around Current Status (N8295): At least 1 percent but less than 20 percent impaired, limited or restricted Mobility: Walking and Moving Around Goal Status [phone]): At least 1 percent but less than 20 percent impaired, limited or restricted Mobility: Walking and Moving Around Discharge Status [phone]): At least 1 percent but less than 20 percent impaired, limited or restricted    Time: 1255-1330 PT Time Calculation (min) (ACUTE ONLY): 35 min   Charges:   PT Evaluation $Initial PT Evaluation Tier I: 1 Procedure     PT G Codes:   PT G-Codes **NOT FOR INPATIENT CLASS** Functional Assessment Tool Used: clinical judgement Functional Limitation: Mobility: Walking and moving around Mobility: Walking and Moving Around Current Status (I6962): At least 1 percent but less than 20 percent impaired, limited or restricted Mobility: Walking and Moving Around Goal Status [phone]): At least 1 percent but less than 20 percent impaired, limited or restricted Mobility: Walking and Moving Around Discharge Status [phone]): At least 1 percent but less than 20 percent impaired, limited or restricted    Konrad Penta  PT 03/29/2015, 1:42 PM [phone]

## 2015-03-29 NOTE — Progress Notes (Signed)
[facility] PROGRESS NOTE  Gershon Musselreva T Driscoll WUJ:[id number] DOB: 05/28/1920 DOA: 03/28/2015 PCP: Cassell SmilesFUSCO,LAWRENCE J., MD  Brief narrative: 81 year old that presented to the hospital after being found for prolonged period of time on the floor. Diagnosed with rhabdomyolysis. Also presenting with leukocytosis with no clear source of infection  Assessment/Plan:   Rhabdomyolysis - Monitor CK levels daily. - Currently trending down - Continue gentle fluid rehydration  Active Problems:   Fall - We'll obtain physical therapy evaluation  Leukocytosis -Resolving off of antibiotics. No clear source of infection on workup.    Hemiparesis affecting left side as late effect of cerebrovascular accident (HCC) - Continue secondary preventive regimen    Carotid artery disease (HCC) - Continue Plavix and statin    Aortic stenosis  Code Status: Full Family Communication: Discussed with patient and son at bedside Disposition Plan: Pending recommendations from physical therapy and continued improvement in condition   Consultants:  None  Procedures:  None  Antibiotics:  None  HPI/Subjective: Patient has no new complaints. No acute issues overnight  Objective: Filed Vitals:   03/29/15 0636  BP: 162/44  Pulse:   Temp: 98.5 F (36.9 C)  Resp:     Intake/Output Summary (Last 24 hours) at 03/29/15 1040 Last data filed at 03/29/15 0600  Gross per 24 hour  Intake 293.33 ml  Output    500 ml  Net -206.67 ml   Filed Weights   03/28/15 1750 03/28/15 2306  Weight: 58.06 kg (128 lb) 57.289 kg (126 lb 4.8 oz)    Exam:   General:  Patient in no acute distress, alert and awake  Cardiovascular: Regular rate and rhythm, no rubs  Respiratory: No increased work of breathing, equal chest rise, no wheezes  Abdomen: Soft, nondistended, nontender  Musculoskeletal: No cyanosis or clubbing on limited exam   Data Reviewed: Basic Metabolic Panel:  Recent Labs Lab 03/28/15 1933  03/29/15 0618  NA 139 140  K 3.9 3.4*  CL 104 107  CO2 22 23  GLUCOSE 76 80  BUN 28* 28*  CREATININE 0.89 0.90  CALCIUM 10.0 9.7   Liver Function Tests:  Recent Labs Lab 03/28/15 1933  AST 71*  ALT 34  ALKPHOS 84  BILITOT 1.1  PROT 6.8  ALBUMIN 4.0   No results for input(s): LIPASE, AMYLASE in the last 168 hours. No results for input(s): AMMONIA in the last 168 hours. CBC:  Recent Labs Lab 03/28/15 1933 03/29/15 0618  WBC 17.5* 13.2*  NEUTROABS 15.5* 10.6*  HGB 14.3 12.8  HCT 40.9 37.9  MCV 94.0 94.3  PLT 154 171   Cardiac Enzymes:  Recent Labs Lab 03/28/15 1933 03/29/15 0618  CKTOTAL 953* 393*   BNP (last 3 results) No results for input(s): BNP in the last 8760 hours.  ProBNP (last 3 results) No results for input(s): PROBNP in the last 8760 hours.  CBG: No results for input(s): GLUCAP in the last 168 hours.  No results found for this or any previous visit (from the past 240 hour(s)).   Studies: No results found.  Scheduled Meds: . amLODipine  5 mg Oral Daily  . clopidogrel  75 mg Oral Daily  . feeding supplement (ENSURE ENLIVE)  237 mL Oral BID BM  . heparin  5,000 Units Subcutaneous 3 times per day  . sertraline  50 mg Oral Daily  . simvastatin  40 mg Oral QPM  . vitamin C  500 mg Oral Daily   Continuous Infusions: . sodium chloride 50 mL/hr at  03/29/15 0600    Time spent: > 35 minutes   Penny Pia  [facility] Pager [phone]. If 7PM-7AM, please contact night-coverage at [email or web address], password [facility] 03/29/2015, 10:40 AM

## 2015-03-29 NOTE — Care Management Note (Signed)
Case Management Note  Patient Details  Name: Debbie Webb T Sondgeroth MRN: [id number] Date of Birth: 07/01/1920  Subjective/Objective:                  Pt is from home, lives alone and is independent at baseline. Pt has no HH services or DME needs prior to admission. Pt uses walker for ambulation. Pt's son in room at time of assessment.   Action/Plan: Pt plans to return home with self care at DC.   Expected Discharge Date:  03/31/15               Expected Discharge Plan:  Home/Self Care  In-House Referral:  NA  Discharge planning Services  CM Consult  Post Acute Care Choice:  NA Choice offered to:  NA  DME Arranged:    DME Agency:     HH Arranged:    HH Agency:     Status of Service:  Completed, signed off  Medicare Important Message Given:    Date Medicare IM Given:    Medicare IM give by:    Date Additional Medicare IM Given:    Additional Medicare Important Message give by:     If discussed at Long Length of Stay Meetings, dates discussed:    Additional Comments:  Malcolm MetroChildress, Azaylea Maves Demske, RN 03/29/2015, 4:00 PM

## 2015-03-29 NOTE — Care Management Obs Status (Signed)
MEDICARE OBSERVATION STATUS NOTIFICATION   Patient Details  Name: Debbie Webb T Furnish MRN: [id number] Date of Birth: 01/31/1921   Medicare Observation Status Notification Given:  Yes    Malcolm MetroChildress, Filbert Craze Demske, RN 03/29/2015, 3:59 PM

## 2015-03-30 DIAGNOSIS — M6282 Rhabdomyolysis: Secondary | ICD-10-CM | POA: Diagnosis not present

## 2015-03-30 DIAGNOSIS — W19XXXD Unspecified fall, subsequent encounter: Secondary | ICD-10-CM

## 2015-03-30 LAB — CBC
HEMATOCRIT: 39.2 % (ref 36.0–46.0)
Hemoglobin: 13.2 g/dL (ref 12.0–15.0)
MCH: 32.4 pg (ref 26.0–34.0)
MCHC: 33.7 g/dL (ref 30.0–36.0)
MCV: 96.1 fL (ref 78.0–100.0)
PLATELETS: 144 10*3/uL — AB (ref 150–400)
RBC: 4.08 MIL/uL (ref 3.87–5.11)
RDW: 13.2 % (ref 11.5–15.5)
WBC: 9.5 10*3/uL (ref 4.0–10.5)

## 2015-03-30 LAB — COMPREHENSIVE METABOLIC PANEL
ALBUMIN: 3.3 g/dL — AB (ref 3.5–5.0)
ALT: 27 U/L (ref 14–54)
AST: 33 U/L (ref 15–41)
Alkaline Phosphatase: 73 U/L (ref 38–126)
Anion gap: 7 (ref 5–15)
BILIRUBIN TOTAL: 1 mg/dL (ref 0.3–1.2)
BUN: 23 mg/dL — AB (ref 6–20)
CO2: 25 mmol/L (ref 22–32)
CREATININE: 0.57 mg/dL (ref 0.44–1.00)
Calcium: 9.2 mg/dL (ref 8.9–10.3)
Chloride: 108 mmol/L (ref 101–111)
GFR calc Af Amer: >60 mL/min (ref >60)
GLUCOSE: 100 mg/dL — AB (ref 65–99)
POTASSIUM: 3.5 mmol/L (ref 3.5–5.1)
Sodium: 140 mmol/L (ref 135–145)
TOTAL PROTEIN: 6.2 g/dL — AB (ref 6.5–8.1)

## 2015-03-30 LAB — CK: Total CK: 92 U/L (ref 38–234)

## 2015-03-30 MED ORDER — ATORVASTATIN CALCIUM 20 MG PO TABS: 20.0000 mg | ORAL_TABLET | Freq: Every day | ORAL | Status: DC

## 2015-03-30 NOTE — Care Management Note (Signed)
Case Management Note  Patient Details  Name: Gershon Musselreva T Smock MRN: [id number] Date of Birth: 03/17/1921  Subjective/Objective:                  Pt being discharged home today with self care. Pt has no DME needs, her walker is in the room.   Action/Plan: Pt given list of companies who provide person medical alert devices per son's request. Pt has no further CM needs.   Expected Discharge Date:  03/31/15               Expected Discharge Plan:  Home/Self Care  In-House Referral:  NA  Discharge planning Services  CM Consult  Post Acute Care Choice:  NA Choice offered to:  NA  DME Arranged:    DME Agency:     HH Arranged:    HH Agency:     Status of Service:  Completed, signed off  Medicare Important Message Given:    Date Medicare IM Given:    Medicare IM give by:    Date Additional Medicare IM Given:    Additional Medicare Important Message give by:     If discussed at Long Length of Stay Meetings, dates discussed:    Additional Comments:  Malcolm MetroChildress, Humaira Sculley Demske, RN 03/30/2015, 10:39 AM

## 2015-03-30 NOTE — Discharge Instructions (Signed)
Rhabdomyolysis °Rhabdomyolysis is a condition that results when muscle cells break down and release substances into the blood that can damage the kidneys. It happens because of damage to the muscles that move bones (skeletal muscle). When you damage this type of muscle, substances inside of your muscle cells are released into your blood. This includes a certain protein called myoglobin. °Your kidneys must filter myoglobin from your blood. Large amounts of myoglobin can cause kidney damage or kidney failure. Other substances that are released by muscle cells may upset the balance of the minerals (electrolytes) in your blood. This makes your blood become too acidic (acidosis). °CAUSES °This condition is caused by muscle damage. Muscle damage often results from: °· Extreme overuse of the muscles. °· An injury that crushes or compresses a muscle. °· Use of illegal drugs, especially cocaine. °· Alcohol abuse. °Other possible causes include: °· Prescription medicines, such as statins, amphetamines, and opiates. °· Infections. °· Inherited muscle diseases. °· High fever. °· Heatstroke. °· Dehydration. °· Seizures. °· Surgery. °RISK FACTORS °This condition is more likely to develop in: °· People who have a family history of muscle disease. °· People who participate in extreme sports, such as marathon running. °· People who have diabetes. °· Older people. °· People who abuse drugs or alcohol. °SYMPTOMS °Symptoms of this condition vary. Some people have very few symptoms, while others have many symptoms. The most common symptoms include: °· Muscle pain and swelling. °· Muscle weakness. °· Dark urine. °· Feeling weak and tired. °Other symptoms include: °· Nausea and vomiting. °· Fever. °· Pain in the abdomen. °· Joint pain. °Signs and symptoms of complications from rhabdomyolysis may include: °· Heart rhythm abnormalities (arrhythmias). °· Seizures. °· Reduced urine production because of kidney failure. °· Very low blood  pressure (shock). °· Uncontrolled bleeding. °DIAGNOSIS °This condition may be diagnosed based on: °· Your symptoms and medical history. °· A physical exam. °· Blood tests to check for: °¨ Muscle breakdown products in the blood (creatine kinase). °¨ Myoglobin. °¨ Acidosis. °¨ Electrolyte imbalances. °· Urine tests to check for myoglobin. °You may also have other tests to check for causes of muscle damage and to check for complications. °TREATMENT °Treatment for this condition focuses on keeping up your fluid level, reversing acidosis, and protecting your kidneys. Treatment may include: °· Fluids and medicines given through an IV tube that is inserted into one of your veins. °· Medicines, such as: °¨ Sodium bicarbonate to reduce acidosis. °¨ Electrolytes to restore the balance of these minerals in your body. °· Hemodialysis. This treatment uses an artificial kidney machine to filter your blood while you recover. You may have this if other treatments are not helping. °HOME CARE INSTRUCTIONS °· Take medicines only as directed by your health care provider. °· Rest at home until your health care provider says that you can return to your normal activities. °· Drink enough fluid to keep your urine clear or pale yellow. °· Do not exercise with great energy and effort (strenuously). Ask your health care provider what level of exercise is safe for you. °· Do not abuse drugs or alcohol. If you are struggling with drug or alcohol use, ask your health care provider for help. °· Keep all follow-up visits as directed by your health care provider. This is important. °SEEK MEDICAL CARE IF: °· You develop symptoms of rhabdomyolysis at home after treatment. °SEEK IMMEDIATE MEDICAL CARE IF: °· You have a seizure. °· You bleed easily or cannot control bleeding. °· You   cannot make urine.  You have chest pain.  You have trouble breathing.   This information is not intended to replace advice given to you by your health care provider.  Make sure you discuss any questions you have with your health care provider.   Document Released: 04/12/2004 Document Revised: 09/14/2014 Document Reviewed: 05/05/2014 Elsevier Interactive Patient Education 2016 [employer]  Fall Prevention in the Home  Falls can cause injuries and can affect people from all age groups. There are many simple things that you can do to make your home safe and to help prevent falls. WHAT CAN I DO ON THE OUTSIDE OF MY HOME?  Regularly repair the edges of walkways and driveways and fix any cracks.  Remove high doorway thresholds.  Trim any shrubbery on the main path into your home.  Use bright outdoor lighting.  Clear walkways of debris and clutter, including tools and rocks.  Regularly check that handrails are securely fastened and in good repair. Both sides of any steps should have handrails.  Install guardrails along the edges of any raised decks or porches.  Have leaves, snow, and ice cleared regularly.  Use sand or salt on walkways during winter months.  In the garage, clean up any spills right away, including grease or oil spills. WHAT CAN I DO IN THE BATHROOM?  Use night lights.  Install grab bars by the toilet and in the tub and shower. Do not use towel bars as grab bars.  Use non-skid mats or decals on the floor of the tub or shower.  If you need to sit down while you are in the shower, use a plastic, non-slip stool.Marland Kitchen.  Keep the floor dry. Immediately clean up any water that spills on the floor.  Remove soap buildup in the tub or shower on a regular basis.  Attach bath mats securely with double-sided non-slip rug tape.  Remove throw rugs and other tripping hazards from the floor. WHAT CAN I DO IN THE BEDROOM?  Use night lights.  Make sure that a bedside light is easy to reach.  Do not use oversized bedding that drapes onto the floor.  Have a firm chair that has side arms to use for getting dressed.  Remove throw rugs and  other tripping hazards from the floor. WHAT CAN I DO IN THE KITCHEN?   Clean up any spills right away.  Avoid walking on wet floors.  Place frequently used items in easy-to-reach places.  If you need to reach for something above you, use a sturdy step stool that has a grab bar.  Keep electrical cables out of the way.  Do not use floor polish or wax that makes floors slippery. If you have to use wax, make sure that it is non-skid floor wax.  Remove throw rugs and other tripping hazards from the floor. WHAT CAN I DO IN THE STAIRWAYS?  Do not leave any items on the stairs.  Make sure that there are handrails on both sides of the stairs. Fix handrails that are broken or loose. Make sure that handrails are as long as the stairways.  Check any carpeting to make sure that it is firmly attached to the stairs. Fix any carpet that is loose or worn.  Avoid having throw rugs at the top or bottom of stairways, or secure the rugs with carpet tape to prevent them from moving.  Make sure that you have a light switch at the top of the stairs and the bottom of the  stairs. If you do not have them, have them installed. WHAT ARE SOME OTHER FALL PREVENTION TIPS?  Wear closed-toe shoes that fit well and support your feet. Wear shoes that have rubber soles or low heels.  When you use a stepladder, make sure that it is completely opened and that the sides are firmly locked. Have someone hold the ladder while you are using it. Do not climb a closed stepladder.  Add color or contrast paint or tape to grab bars and handrails in your home. Place contrasting color strips on the first and last steps.  Use mobility aids as needed, such as canes, walkers, scooters, and crutches.  Turn on lights if it is dark. Replace any light bulbs that burn out.  Set up furniture so that there are clear paths. Keep the furniture in the same spot.  Fix any uneven floor surfaces.  Choose a carpet design that does not hide  the edge of steps of a stairway.  Be aware of any and all pets.  Review your medicines with your healthcare provider. Some medicines can cause dizziness or changes in blood pressure, which increase your risk of falling. Talk with your health care provider about other ways that you can decrease your risk of falls. This may include working with a physical therapist or trainer to improve your strength, balance, and endurance.   This information is not intended to replace advice given to you by your health care provider. Make sure you discuss any questions you have with your health care provider.   Document Released: 04/20/2002 Document Revised: 09/14/2014 Document Reviewed: 06/04/2014 Elsevier Interactive Patient Education [employer]

## 2015-03-30 NOTE — Progress Notes (Signed)
Discharge instructions given on medications,and follow up visits,patient,and family verbalized understanding. No c/o pain or discomfort noted. Accompanied by staff to an awaiting vehicle.

## 2015-03-30 NOTE — Discharge Summary (Signed)
Physician Discharge Summary  Debbie Webb:[id number] DOB: Nov 28, 1920 DOA: 03/28/2015  PCP: Cassell Smiles., MD  Admit date: 03/28/2015 Discharge date: 03/30/2015  Time spent: Greater than 30 minutes  Recommendations for Outpatient Follow-up:  1. Dr. [name], PCP in one week with repeat labs (CBC & BMP)  Discharge Diagnoses:  Active Problems:   Fall   Hemiparesis affecting left side as late effect of cerebrovascular accident [facility])   Rhabdomyolysis   Carotid artery disease (HCC)   Aortic stenosis   Discharge Condition: Improved & Stable  Diet recommendation: Heart healthy diet.  Filed Weights   03/28/15 1750 03/28/15 2306  Weight: 58.06 kg (128 lb) 57.289 kg (126 lb 4.8 oz)    History of present illness:  81 y.o. female with hx of prior CVA and left hemiparesis, lives alone, hx of tremor, hx of carotid stenosis, s/p CEA on Plavix, HTN, fell on her patio while attempting to bring into the house some flowerpots because she was concerned they would die from cold weather. She thinks she might have either tripped or slipped on something. She denies head injury or loss of consciousness. She landed on her buttock. She couldn't get up and crawled into the house. She laid on the floor for 3 days and 2 nights until her son came by. Her son found her and brought her to the ED. Work up showed her CPK was 953 with normal Cr. Her WBC was 17K with no clinical evidence of infection. Hospitalist was asked to admit her for OBS in fear of further elevation of her CPK and to be sure she doesn't go into AKI.   Hospital Course:   Rhabdomyolysis - Secondary to fall and laying on the floor for extended period of time. Treated with IV fluids. CK has normalized. Pharmacy has switched statins from simvastatin to Lipitor to minimize risk of rhabdomyolysis while she is on amlodipine.  Mechanical fall - No significant injury sustained. PT evaluated and did not see any further needs. Patient  states that she is independent of her activities. Her son lives next door. Case management was consulted to assist her with obtaining a [employer] alert.  Essential hypertension - Mildly uncontrolled. Resume amlodipine and HCTZ at discharge  Hyperlipidemia - Statins were changed from simvastatin to atorvastatin.  CVA - Continue Plavix and statins   Mild thrombocytopenia - Follow CBCs in a few days as outpatient.  Leukocytosis - Felt to be a stress response. No clinical suspicion for infection. Resolved.  Consultations:  None  Procedures:  None    Discharge Exam:  Complaints:  Denies complaints. Denies pain. Anxious to go home.  Filed Vitals:   03/29/15 0636 03/29/15 1437 03/29/15 2034 03/30/15 0649  BP: 162/44 169/55 185/55 189/50  Pulse:  58 61 61  Temp: 98.5 F (36.9 C) 98.6 F (37 C) 98.5 F (36.9 C) 98.1 F (36.7 C)  TempSrc: Oral Oral Oral Oral  Resp:  Height:      Weight:      SpO2: 97% 98% 96% 98%   Patient was examined with a female RN in room as chaperone.  General exam: Pleasant elderly female lying comfortably in bed seen eating breakfast this morning.  Respiratory system: Clear. No increased work of breathing. Cardiovascular system: S1 & S2 heard, RRR. No JVD, murmurs, gallops, clicks or pedal edema. Gastrointestinal system: Abdomen is nondistended, soft and nontender. Normal bowel sounds heard. Central nervous system: Alert and oriented. No focal neurological deficits. Extremities: Symmetric  5 x 5 power. Patient has some superficial small cuts and bruises on left elbow and both legs. No major injuries noted over her entire body.  Discharge Instructions      Discharge Instructions    Call MD for:  difficulty breathing, headache or visual disturbances    Complete by:  As directed      Call MD for:  extreme fatigue    Complete by:  As directed      Call MD for:  persistant dizziness or light-headedness    Complete by:  As directed       Call MD for:  persistant nausea and vomiting    Complete by:  As directed      Call MD for:  severe uncontrolled pain    Complete by:  As directed      Call MD for:  temperature >100.4    Complete by:  As directed      Diet - low sodium heart healthy    Complete by:  As directed      Increase activity slowly    Complete by:  As directed             Medication List    STOP taking these medications        simvastatin 40 MG tablet  Commonly known as:  ZOCOR      TAKE these medications        ALEVE 220 MG tablet  Generic drug:  naproxen sodium  Take 220 mg by mouth as needed. For pain     amLODipine 5 MG tablet  Commonly known as:  NORVASC  Take 5 mg by mouth daily.     atorvastatin 20 MG tablet  Commonly known as:  LIPITOR  Take 1 tablet (20 mg total) by mouth daily at 6 PM.     clopidogrel 75 MG tablet  Commonly known as:  PLAVIX  Take 75 mg by mouth daily.     ENSURE  Take 237 mLs by mouth 2 (two) times daily between meals.     hydrochlorothiazide 25 MG tablet  Commonly known as:  HYDRODIURIL  Take 25 mg by mouth daily.     oxybutynin 5 MG tablet  Commonly known as:  DITROPAN  Take 2.5 mg by mouth daily as needed for bladder spasms.     sertraline 50 MG tablet  Commonly known as:  ZOLOFT  Take 50 mg by mouth daily.     vitamin B-12 100 MCG tablet  Commonly known as:  CYANOCOBALAMIN  Take 100 mcg by mouth daily.     vitamin C 500 MG tablet  Commonly known as:  ASCORBIC ACID  Take 500 mg by mouth daily.       Follow-up Information    Follow up with Cassell SmilesFUSCO,LAWRENCE J., MD. Schedule an appointment as soon as possible for a visit on 03/30/2015.   Specialty:  Internal Medicine   Why:  to be seen with repeat labs (CBC & BMP). at   Contact information:   [address] [place] [address] [phone]        The results of significant diagnostics from this hospitalization (including imaging, microbiology, ancillary and laboratory) are  listed below for reference.    Significant Diagnostic Studies: No results found.  Microbiology: No results found for this or any previous visit (from the past 240 hour(s)).   Labs: Basic Metabolic Panel:  Recent Labs Lab 03/28/15 1933 03/29/15 0618 03/30/15 0612  NA 139 140 140  K 3.9 3.4* 3.5  CL 104 107 108  CO2 GLUCOSE 76 80 100*  BUN 28* 28* 23*  CREATININE 0.89 0.90 0.57  CALCIUM 10.0 9.7 9.2   Liver Function Tests:  Recent Labs Lab 03/28/15 1933 03/30/15 0612  AST 71* 33  ALT 34 27  ALKPHOS 84 73  BILITOT 1.1 1.0  PROT 6.8 6.2*  ALBUMIN 4.0 3.3*   No results for input(s): LIPASE, AMYLASE in the last 168 hours. No results for input(s): AMMONIA in the last 168 hours. CBC:  Recent Labs Lab 03/28/15 1933 03/29/15 0618 03/30/15 0612  WBC 17.5* 13.2* 9.5  NEUTROABS 15.5* 10.6*  --   HGB 14.3 12.8 13.2  HCT 40.9 37.9 39.2  MCV 94.0 94.3 96.1  PLT 154 171 144*   Cardiac Enzymes:  Recent Labs Lab 03/28/15 1933 03/29/15 0618 03/30/15 0612  CKTOTAL 953* 393* 92   BNP: BNP (last 3 results) No results for input(s): BNP in the last 8760 hours.  ProBNP (last 3 results) No results for input(s): PROBNP in the last 8760 hours.  CBG: No results for input(s): GLUCAP in the last 168 hours.  Discussed at length with patient's friend Ms Kriste Basque at bedside.  Signed:  Marcellus Scott, MD, FACP, FHM. [facility] Pager [phone]  If 7PM-7AM, please contact night-coverage [email or web address] Password [facility] 03/30/2015, 6:37 PM

## 2015-04-05 DIAGNOSIS — Z6822 Body mass index (BMI) 22.0-22.9, adult: Secondary | ICD-10-CM | POA: Diagnosis not present

## 2015-04-05 DIAGNOSIS — M6282 Rhabdomyolysis: Secondary | ICD-10-CM | POA: Diagnosis not present

## 2015-04-05 DIAGNOSIS — Z1389 Encounter for screening for other disorder: Secondary | ICD-10-CM | POA: Diagnosis not present

## 2015-04-05 DIAGNOSIS — W1830XA Fall on same level, unspecified, initial encounter: Secondary | ICD-10-CM | POA: Diagnosis not present

## 2015-05-04 DIAGNOSIS — M1991 Primary osteoarthritis, unspecified site: Secondary | ICD-10-CM | POA: Diagnosis not present

## 2015-05-04 DIAGNOSIS — R5383 Other fatigue: Secondary | ICD-10-CM | POA: Diagnosis not present

## 2015-05-04 DIAGNOSIS — Z6822 Body mass index (BMI) 22.0-22.9, adult: Secondary | ICD-10-CM | POA: Diagnosis not present

## 2015-05-04 DIAGNOSIS — I10 Essential (primary) hypertension: Secondary | ICD-10-CM | POA: Diagnosis not present

## 2015-05-04 DIAGNOSIS — Z1389 Encounter for screening for other disorder: Secondary | ICD-10-CM | POA: Diagnosis not present

## 2015-05-13 DIAGNOSIS — M810 Age-related osteoporosis without current pathological fracture: Secondary | ICD-10-CM | POA: Diagnosis not present

## 2015-05-13 DIAGNOSIS — Z6823 Body mass index (BMI) 23.0-23.9, adult: Secondary | ICD-10-CM | POA: Diagnosis not present

## 2015-05-13 DIAGNOSIS — Z1389 Encounter for screening for other disorder: Secondary | ICD-10-CM | POA: Diagnosis not present

## 2015-05-13 DIAGNOSIS — K219 Gastro-esophageal reflux disease without esophagitis: Secondary | ICD-10-CM | POA: Diagnosis not present

## 2015-05-13 DIAGNOSIS — M1991 Primary osteoarthritis, unspecified site: Secondary | ICD-10-CM | POA: Diagnosis not present

## 2016-01-02 ENCOUNTER — Other Ambulatory Visit (HOSPITAL_COMMUNITY): Payer: Self-pay | Admitting: Family Medicine

## 2016-01-02 DIAGNOSIS — Z1389 Encounter for screening for other disorder: Secondary | ICD-10-CM | POA: Diagnosis not present

## 2016-01-02 DIAGNOSIS — Z6823 Body mass index (BMI) 23.0-23.9, adult: Secondary | ICD-10-CM | POA: Diagnosis not present

## 2016-01-02 DIAGNOSIS — R0781 Pleurodynia: Secondary | ICD-10-CM | POA: Diagnosis not present

## 2016-01-02 DIAGNOSIS — M25512 Pain in left shoulder: Secondary | ICD-10-CM | POA: Diagnosis not present

## 2016-01-02 DIAGNOSIS — E782 Mixed hyperlipidemia: Secondary | ICD-10-CM | POA: Diagnosis not present

## 2016-01-02 DIAGNOSIS — I10 Essential (primary) hypertension: Secondary | ICD-10-CM | POA: Diagnosis not present

## 2016-01-02 DIAGNOSIS — I639 Cerebral infarction, unspecified: Secondary | ICD-10-CM | POA: Diagnosis not present

## 2016-01-03 ENCOUNTER — Other Ambulatory Visit (HOSPITAL_COMMUNITY): Payer: Self-pay | Admitting: Family Medicine

## 2016-01-03 DIAGNOSIS — R0781 Pleurodynia: Secondary | ICD-10-CM

## 2016-01-03 DIAGNOSIS — M25512 Pain in left shoulder: Secondary | ICD-10-CM

## 2016-01-04 ENCOUNTER — Ambulatory Visit (HOSPITAL_COMMUNITY)
Admission: RE | Admit: 2016-01-04 | Discharge: 2016-01-04 | Disposition: A | Discharge status: Home / Self Care | Payer: Medicare Other | Source: Ambulatory Visit | Attending: Family Medicine | Admitting: Family Medicine

## 2016-01-04 DIAGNOSIS — X58XXXA Exposure to other specified factors, initial encounter: Secondary | ICD-10-CM | POA: Diagnosis not present

## 2016-01-04 DIAGNOSIS — I700 Atherosclerosis of aorta: Secondary | ICD-10-CM | POA: Insufficient documentation

## 2016-01-04 DIAGNOSIS — M25512 Pain in left shoulder: Secondary | ICD-10-CM | POA: Diagnosis not present

## 2016-01-04 DIAGNOSIS — S2242XA Multiple fractures of ribs, left side, initial encounter for closed fracture: Secondary | ICD-10-CM | POA: Diagnosis not present

## 2016-01-04 DIAGNOSIS — R0781 Pleurodynia: Secondary | ICD-10-CM | POA: Diagnosis not present

## 2016-01-04 DIAGNOSIS — J90 Pleural effusion, not elsewhere classified: Secondary | ICD-10-CM | POA: Diagnosis not present

## 2016-01-12 DIAGNOSIS — S42102D Fracture of unspecified part of scapula, left shoulder, subsequent encounter for fracture with routine healing: Secondary | ICD-10-CM | POA: Diagnosis not present

## 2016-01-12 DIAGNOSIS — S2242XA Multiple fractures of ribs, left side, initial encounter for closed fracture: Secondary | ICD-10-CM | POA: Diagnosis not present

## 2016-01-12 DIAGNOSIS — S42002S Fracture of unspecified part of left clavicle, sequela: Secondary | ICD-10-CM | POA: Diagnosis not present

## 2016-01-17 DIAGNOSIS — M25512 Pain in left shoulder: Secondary | ICD-10-CM | POA: Diagnosis not present

## 2016-01-26 DIAGNOSIS — R1319 Other dysphagia: Secondary | ICD-10-CM | POA: Diagnosis not present

## 2016-02-07 ENCOUNTER — Other Ambulatory Visit (HOSPITAL_COMMUNITY): Payer: Self-pay | Admitting: Internal Medicine

## 2016-02-07 ENCOUNTER — Ambulatory Visit (HOSPITAL_COMMUNITY)
Admission: RE | Admit: 2016-02-07 | Discharge: 2016-02-07 | Disposition: A | Discharge status: Home / Self Care | Payer: Medicare Other | Source: Ambulatory Visit | Attending: Internal Medicine | Admitting: Internal Medicine

## 2016-02-07 DIAGNOSIS — I700 Atherosclerosis of aorta: Secondary | ICD-10-CM | POA: Diagnosis not present

## 2016-02-07 DIAGNOSIS — K449 Diaphragmatic hernia without obstruction or gangrene: Secondary | ICD-10-CM | POA: Insufficient documentation

## 2016-02-07 DIAGNOSIS — R059 Cough, unspecified: Secondary | ICD-10-CM

## 2016-02-07 DIAGNOSIS — R05 Cough: Secondary | ICD-10-CM | POA: Diagnosis not present

## 2016-02-15 ENCOUNTER — Encounter (INDEPENDENT_AMBULATORY_CARE_PROVIDER_SITE_OTHER): Payer: Self-pay | Admitting: Internal Medicine

## 2016-02-21 ENCOUNTER — Encounter (INDEPENDENT_AMBULATORY_CARE_PROVIDER_SITE_OTHER): Payer: Self-pay | Admitting: Internal Medicine

## 2016-02-21 ENCOUNTER — Ambulatory Visit (INDEPENDENT_AMBULATORY_CARE_PROVIDER_SITE_OTHER): Payer: Medicare Other | Admitting: Internal Medicine

## 2016-02-21 VITALS — BP 138/82 | HR 64 | Temp 98.1°F | Ht 63.0 in | Wt 126.6 lb

## 2016-02-21 DIAGNOSIS — R1310 Dysphagia, unspecified: Secondary | ICD-10-CM | POA: Diagnosis not present

## 2016-02-21 DIAGNOSIS — R1319 Other dysphagia: Secondary | ICD-10-CM

## 2016-02-21 NOTE — Patient Instructions (Signed)
Patient declined an EGD/ED. OV as needed.

## 2016-02-21 NOTE — Progress Notes (Signed)
   Subjective:    [name], female    DOB: 08/29/1920, 82 y.o.   MRN: [id number]  HPI Referred by Dr. Sherwood GamblerFusco for dysphagia. She tells me she has had dysphagia for years. She has to chew her food well.  She avoids bread and potatoes.  She has had symptoms for years.  Can eat chicken and pork without difficulty as long as she chew it well. He appetite is good. No weight loss. Usually has a BM daily.   CVA 20 yrs ago and maintained on Plavix.     Review of Systems Past Medical History:  Diagnosis Date  . Carotid artery disease (HCC)    Status post carotid endarterectomy x2  . Essential hypertension, benign   . Hemiparesis affecting left side as late effect of cerebrovascular accident (HCC) 08/03/2011  . Stroke (HCC) 1997  . Tremor     Past Surgical History:  Procedure Laterality Date  . CAROTID ENDARTERECTOMY     Right    No Known Allergies  Current Outpatient Prescriptions on File Prior to Visit  Medication Sig Dispense Refill  . amLODipine (NORVASC) 5 MG tablet Take 5 mg by mouth daily.    . clopidogrel (PLAVIX) 75 MG tablet Take 75 mg by mouth daily.    . hydrochlorothiazide (HYDRODIURIL) 25 MG tablet Take 25 mg by mouth daily.    . naproxen sodium (ALEVE) 220 MG tablet Take 220 mg by mouth as needed. For pain    . oxybutynin (DITROPAN) 5 MG tablet Take 2.5 mg by mouth daily as needed for bladder spasms.     Marland Kitchen. sertraline (ZOLOFT) 50 MG tablet Take 50 mg by mouth daily.    . vitamin B-12 (CYANOCOBALAMIN) 100 MCG tablet Take 100 mcg by mouth daily.    . vitamin C (ASCORBIC ACID) 500 MG tablet Take 500 mg by mouth daily.    Marland Kitchen. ENSURE (ENSURE) Take 237 mLs by mouth 2 (two) times daily between meals.     No current facility-administered medications on file prior to visit.        Objective:   Physical Exam Blood pressure 138/82, pulse 64, temperature 98.1 F (36.7 C), height 5\' 3"  (1.6 m), weight 126 lb 9.6 oz (57.4 kg).  Alert and oriented. Skin warm and  dry. Oral mucosa is moist.   . Sclera anicteric, conjunctivae is pink. Thyroid not enlarged. No cervical lymphadenopathy. Lungs clear. Heart regular rate and rhythm.  Abdomen is soft. Bowel sounds are positive. No hepatomegaly. No abdominal masses felt. No tenderness.  No edema to lower extremities. Patient is alert and oriented.       Assessment & Plan:  Dysphagia.  Symptoms for years. Patient declined an EGD/ED. OV as needed.

## 2016-05-24 DIAGNOSIS — E782 Mixed hyperlipidemia: Secondary | ICD-10-CM | POA: Diagnosis not present

## 2016-05-24 DIAGNOSIS — I10 Essential (primary) hypertension: Secondary | ICD-10-CM | POA: Diagnosis not present

## 2016-05-24 DIAGNOSIS — Z1389 Encounter for screening for other disorder: Secondary | ICD-10-CM | POA: Diagnosis not present

## 2016-05-24 DIAGNOSIS — L0201 Cutaneous abscess of face: Secondary | ICD-10-CM | POA: Diagnosis not present

## 2016-05-24 DIAGNOSIS — I6350 Cerebral infarction due to unspecified occlusion or stenosis of unspecified cerebral artery: Secondary | ICD-10-CM | POA: Diagnosis not present

## 2016-05-24 DIAGNOSIS — K219 Gastro-esophageal reflux disease without esophagitis: Secondary | ICD-10-CM | POA: Diagnosis not present

## 2016-06-04 DIAGNOSIS — L308 Other specified dermatitis: Secondary | ICD-10-CM | POA: Diagnosis not present

## 2016-06-04 DIAGNOSIS — L723 Sebaceous cyst: Secondary | ICD-10-CM | POA: Diagnosis not present

## 2016-06-18 DIAGNOSIS — L720 Epidermal cyst: Secondary | ICD-10-CM | POA: Diagnosis not present

## 2016-08-06 DIAGNOSIS — L905 Scar conditions and fibrosis of skin: Secondary | ICD-10-CM | POA: Diagnosis not present

## 2016-09-12 DIAGNOSIS — M1991 Primary osteoarthritis, unspecified site: Secondary | ICD-10-CM | POA: Diagnosis not present

## 2016-09-12 DIAGNOSIS — Z6822 Body mass index (BMI) 22.0-22.9, adult: Secondary | ICD-10-CM | POA: Diagnosis not present

## 2016-09-12 DIAGNOSIS — I69354 Hemiplegia and hemiparesis following cerebral infarction affecting left non-dominant side: Secondary | ICD-10-CM | POA: Diagnosis not present

## 2016-09-12 DIAGNOSIS — Z1389 Encounter for screening for other disorder: Secondary | ICD-10-CM | POA: Diagnosis not present

## 2016-09-12 DIAGNOSIS — I10 Essential (primary) hypertension: Secondary | ICD-10-CM | POA: Diagnosis not present

## 2016-09-13 ENCOUNTER — Other Ambulatory Visit (HOSPITAL_COMMUNITY): Payer: Self-pay | Admitting: Internal Medicine

## 2016-09-13 DIAGNOSIS — J3489 Other specified disorders of nose and nasal sinuses: Secondary | ICD-10-CM

## 2016-09-26 DIAGNOSIS — L905 Scar conditions and fibrosis of skin: Secondary | ICD-10-CM | POA: Diagnosis not present

## 2016-09-28 ENCOUNTER — Ambulatory Visit (HOSPITAL_COMMUNITY)
Admission: RE | Admit: 2016-09-28 | Discharge: 2016-09-28 | Disposition: A | Discharge status: Home / Self Care | Payer: Medicare Other | Source: Ambulatory Visit | Attending: Internal Medicine | Admitting: Internal Medicine

## 2016-09-28 DIAGNOSIS — J3489 Other specified disorders of nose and nasal sinuses: Secondary | ICD-10-CM

## 2016-09-28 DIAGNOSIS — H578 Other specified disorders of eye and adnexa: Secondary | ICD-10-CM | POA: Diagnosis not present

## 2016-09-28 DIAGNOSIS — R220 Localized swelling, mass and lump, head: Secondary | ICD-10-CM | POA: Insufficient documentation

## 2016-10-09 DIAGNOSIS — Z1389 Encounter for screening for other disorder: Secondary | ICD-10-CM | POA: Diagnosis not present

## 2016-10-09 DIAGNOSIS — Z6822 Body mass index (BMI) 22.0-22.9, adult: Secondary | ICD-10-CM | POA: Diagnosis not present

## 2016-10-09 DIAGNOSIS — I10 Essential (primary) hypertension: Secondary | ICD-10-CM | POA: Diagnosis not present

## 2016-10-09 DIAGNOSIS — E782 Mixed hyperlipidemia: Secondary | ICD-10-CM | POA: Diagnosis not present

## 2016-10-09 DIAGNOSIS — K219 Gastro-esophageal reflux disease without esophagitis: Secondary | ICD-10-CM | POA: Diagnosis not present

## 2016-10-09 DIAGNOSIS — N390 Urinary tract infection, site not specified: Secondary | ICD-10-CM | POA: Diagnosis not present

## 2016-10-09 DIAGNOSIS — Z0001 Encounter for general adult medical examination with abnormal findings: Secondary | ICD-10-CM | POA: Diagnosis not present

## 2016-10-17 ENCOUNTER — Encounter (INDEPENDENT_AMBULATORY_CARE_PROVIDER_SITE_OTHER): Payer: Self-pay | Admitting: Internal Medicine

## 2016-10-17 ENCOUNTER — Encounter (INDEPENDENT_AMBULATORY_CARE_PROVIDER_SITE_OTHER): Payer: Self-pay

## 2016-10-17 DIAGNOSIS — Z6822 Body mass index (BMI) 22.0-22.9, adult: Secondary | ICD-10-CM | POA: Diagnosis not present

## 2016-10-17 DIAGNOSIS — Z1389 Encounter for screening for other disorder: Secondary | ICD-10-CM | POA: Diagnosis not present

## 2016-10-31 ENCOUNTER — Ambulatory Visit (INDEPENDENT_AMBULATORY_CARE_PROVIDER_SITE_OTHER): Payer: Medicare Other | Admitting: Internal Medicine

## 2016-11-12 ENCOUNTER — Ambulatory Visit (INDEPENDENT_AMBULATORY_CARE_PROVIDER_SITE_OTHER): Payer: Medicare Other | Admitting: Internal Medicine

## 2016-11-12 ENCOUNTER — Encounter (INDEPENDENT_AMBULATORY_CARE_PROVIDER_SITE_OTHER): Payer: Self-pay | Admitting: Internal Medicine

## 2016-11-12 VITALS — BP 160/80 | HR 72 | Temp 98.0°F | Ht 65.0 in | Wt 118.9 lb

## 2016-11-12 DIAGNOSIS — R195 Other fecal abnormalities: Secondary | ICD-10-CM | POA: Diagnosis not present

## 2016-11-12 NOTE — Progress Notes (Signed)
   Subjective:    [name], female    DOB: 05/22/1920, 83 y.o.   MRN: [id number]  HPI  Referred by Dr. Sherwood GamblerFusco for change in bowel habits. Last seen in 2017 with dysphagia.  Her appetite is good. There is no abdominal pain.  She is having a BM daily. Stools look like lin sausages. Stools are very soft.  Stools have been sausage like for about a year. She is not having any pain.  No melena or BRRB.  Her last colonoscopy was years ago and was normal.  Sometimes she has incontinence. (She doesn't make it to the bathroom).  Mother had colon cancer at age 81.    Review of Systems Past Medical History:  Diagnosis Date  . Carotid artery disease (HCC)    Status post carotid endarterectomy x2  . Essential hypertension, benign   . Hemiparesis affecting left side as late effect of cerebrovascular accident (HCC) 08/03/2011  . Stroke (HCC) 1997  . Tremor     Past Surgical History:  Procedure Laterality Date  . CAROTID ENDARTERECTOMY     Right    No Known Allergies  Current Outpatient Prescriptions on File Prior to Visit  Medication Sig Dispense Refill  . amLODipine (NORVASC) 5 MG tablet Take 5 mg by mouth daily.    . clopidogrel (PLAVIX) 75 MG tablet Take 75 mg by mouth daily.    Marland Kitchen. ENSURE (ENSURE) Take 237 mLs by mouth 2 (two) times daily between meals.    . hydrochlorothiazide (HYDRODIURIL) 25 MG tablet Take 25 mg by mouth daily.    . meprobamate (EQUANIL) 400 MG tablet Take 400 mg by mouth as needed for anxiety.    . naproxen sodium (ALEVE) 220 MG tablet Take 220 mg by mouth as needed. For pain    . sertraline (ZOLOFT) 50 MG tablet Take 50 mg by mouth daily.    . simvastatin (ZOCOR) 40 MG tablet Take 40 mg by mouth daily.    . vitamin B-12 (CYANOCOBALAMIN) 100 MCG tablet Take 100 mcg by mouth daily.    . vitamin C (ASCORBIC ACID) 500 MG tablet Take 500 mg by mouth daily.     No current facility-administered medications on file prior to visit.         Objective:   Physical Exam Blood pressure (!) 160/80, pulse 72, temperature 98 F (36.7 C), height 5\' 5"  (1.651 m), weight 118 lb 14.4 oz (53.9 kg).  Alert and oriented. Skin warm and dry. Oral mucosa is moist.   . Sclera anicteric, conjunctivae is pink. Thyroid not enlarged. No cervical lymphadenopathy. Lungs clear. Heart regular rate and rhythm.  Abdomen is soft. Bowel sounds are positive. No hepatomegaly. No abdominal masses felt. No tenderness.  No edema to lower extremities.  Stool brown and guaiac negative.        Assessment & Plan:  Change in stool. Stools are smaller. Am going to send 3 stool cards with patient. Fiber 4 gms po.

## 2016-11-12 NOTE — Patient Instructions (Addendum)
Three stool cards home with patient.  Fiber 4 gms po.

## 2016-11-22 ENCOUNTER — Telehealth (INDEPENDENT_AMBULATORY_CARE_PROVIDER_SITE_OTHER): Payer: Self-pay | Admitting: *Deleted

## 2016-11-22 NOTE — Telephone Encounter (Signed)
   Diagnosis:    Result(s)   Card 1: : Negative:     Card 2:  Negative:   Card 3: Negative:    Completed by: Larose Hiresammy Rechy Bost, LPN   HEMOCCULT SENSA DEVELOPER: LOT#:  [id number] S EXPIRATION DATE: 2020/05   HEMOCCULT SENSA CARD:  LOT#:  [id number] R EXPIRATION DATE: 05/20   CARD CONTROL RESULTS:  POSITIVE: Positive NEGATIVE: Negative    ADDITIONAL COMMENTS: Results were forwarded to ordering provider, Dorene Arerri Setzer, NP

## 2016-11-27 ENCOUNTER — Telehealth (INDEPENDENT_AMBULATORY_CARE_PROVIDER_SITE_OTHER): Payer: Self-pay | Admitting: Internal Medicine

## 2016-11-27 NOTE — Telephone Encounter (Signed)
Patient would like to know results from her test.  [phone]

## 2016-11-28 ENCOUNTER — Telehealth (INDEPENDENT_AMBULATORY_CARE_PROVIDER_SITE_OTHER): Payer: Self-pay | Admitting: Internal Medicine

## 2016-11-28 DIAGNOSIS — R195 Other fecal abnormalities: Secondary | ICD-10-CM

## 2016-11-28 NOTE — Telephone Encounter (Signed)
Will get a CBC on her

## 2016-11-28 NOTE — Telephone Encounter (Signed)
Cbc ordered

## 2016-11-28 NOTE — Telephone Encounter (Signed)
Results given to patient. Am going to get a CBC. Dr. Karilyn Cotaehman will review chart.

## 2016-11-28 NOTE — Telephone Encounter (Signed)
No answer at home. Will call later today

## 2016-12-03 DIAGNOSIS — R195 Other fecal abnormalities: Secondary | ICD-10-CM | POA: Diagnosis not present

## 2016-12-03 LAB — CBC WITH DIFFERENTIAL/PLATELET
BASOS ABS: 82 {cells}/uL (ref 0–200)
Basophils Relative: 1 %
EOS ABS: 164 {cells}/uL (ref 15–500)
EOS PCT: 2 %
HEMATOCRIT: 39.2 % (ref 35.0–45.0)
HEMOGLOBIN: 13 g/dL (ref 11.7–15.5)
LYMPHS ABS: 902 {cells}/uL (ref 850–3900)
Lymphocytes Relative: 11 %
MCH: 31.5 pg (ref 27.0–33.0)
MCHC: 33.2 g/dL (ref 32.0–36.0)
MCV: 94.9 fL (ref 80.0–100.0)
MONO ABS: 656 {cells}/uL (ref 200–950)
MPV: 11.2 fL (ref 7.5–12.5)
Monocytes Relative: 8 %
NEUTROS ABS: 6396 {cells}/uL (ref 1500–7800)
NEUTROS PCT: 78 %
Platelets: 225 10*3/uL (ref 140–400)
RBC: 4.13 MIL/uL (ref 3.80–5.10)
RDW: 13.3 % (ref 11.0–15.0)
WBC: 8.2 10*3/uL (ref 3.8–10.8)

## 2016-12-10 ENCOUNTER — Telehealth (INDEPENDENT_AMBULATORY_CARE_PROVIDER_SITE_OTHER): Payer: Self-pay | Admitting: Internal Medicine

## 2016-12-10 NOTE — Telephone Encounter (Signed)
Sent in error

## 2016-12-10 NOTE — Telephone Encounter (Signed)
Patient called, would like lab results.  Patient was informed that Camelia Engerri would be back in the office tomorrow.

## 2016-12-11 NOTE — Telephone Encounter (Signed)
Results given to patient's caregiver

## 2016-12-12 ENCOUNTER — Other Ambulatory Visit (INDEPENDENT_AMBULATORY_CARE_PROVIDER_SITE_OTHER): Payer: Self-pay | Admitting: *Deleted

## 2016-12-12 DIAGNOSIS — R195 Other fecal abnormalities: Secondary | ICD-10-CM

## 2016-12-12 DIAGNOSIS — R194 Change in bowel habit: Secondary | ICD-10-CM

## 2016-12-27 ENCOUNTER — Emergency Department (HOSPITAL_COMMUNITY): Payer: Medicare Other

## 2016-12-27 ENCOUNTER — Inpatient Hospital Stay (HOSPITAL_COMMUNITY)
Admission: EM | Admit: 2016-12-27 | Discharge: 2016-12-31 | DRG: 470 | Disposition: A | Discharge status: Skilled Nursing Facility | Payer: Medicare Other | Attending: Internal Medicine | Admitting: Internal Medicine

## 2016-12-27 ENCOUNTER — Encounter (HOSPITAL_COMMUNITY): Payer: Self-pay | Admitting: Emergency Medicine

## 2016-12-27 DIAGNOSIS — S42292A Other displaced fracture of upper end of left humerus, initial encounter for closed fracture: Secondary | ICD-10-CM

## 2016-12-27 DIAGNOSIS — I10 Essential (primary) hypertension: Secondary | ICD-10-CM | POA: Diagnosis not present

## 2016-12-27 DIAGNOSIS — W010XXA Fall on same level from slipping, tripping and stumbling without subsequent striking against object, initial encounter: Secondary | ICD-10-CM | POA: Diagnosis not present

## 2016-12-27 DIAGNOSIS — S72012A Unspecified intracapsular fracture of left femur, initial encounter for closed fracture: Secondary | ICD-10-CM | POA: Diagnosis not present

## 2016-12-27 DIAGNOSIS — D72829 Elevated white blood cell count, unspecified: Secondary | ICD-10-CM | POA: Diagnosis present

## 2016-12-27 DIAGNOSIS — Z79899 Other long term (current) drug therapy: Secondary | ICD-10-CM

## 2016-12-27 DIAGNOSIS — Z471 Aftercare following joint replacement surgery: Secondary | ICD-10-CM | POA: Diagnosis not present

## 2016-12-27 DIAGNOSIS — S3993XA Unspecified injury of pelvis, initial encounter: Secondary | ICD-10-CM | POA: Diagnosis not present

## 2016-12-27 DIAGNOSIS — W19XXXD Unspecified fall, subsequent encounter: Secondary | ICD-10-CM | POA: Diagnosis not present

## 2016-12-27 DIAGNOSIS — S72092A Other fracture of head and neck of left femur, initial encounter for closed fracture: Secondary | ICD-10-CM | POA: Diagnosis not present

## 2016-12-27 DIAGNOSIS — D696 Thrombocytopenia, unspecified: Secondary | ICD-10-CM | POA: Diagnosis not present

## 2016-12-27 DIAGNOSIS — Y92009 Unspecified place in unspecified non-institutional (private) residence as the place of occurrence of the external cause: Secondary | ICD-10-CM

## 2016-12-27 DIAGNOSIS — W1830XA Fall on same level, unspecified, initial encounter: Secondary | ICD-10-CM | POA: Diagnosis present

## 2016-12-27 DIAGNOSIS — S42212A Unspecified displaced fracture of surgical neck of left humerus, initial encounter for closed fracture: Secondary | ICD-10-CM | POA: Diagnosis not present

## 2016-12-27 DIAGNOSIS — I69154 Hemiplegia and hemiparesis following nontraumatic intracerebral hemorrhage affecting left non-dominant side: Secondary | ICD-10-CM | POA: Diagnosis not present

## 2016-12-27 DIAGNOSIS — S72002A Fracture of unspecified part of neck of left femur, initial encounter for closed fracture: Secondary | ICD-10-CM | POA: Diagnosis present

## 2016-12-27 DIAGNOSIS — M6281 Muscle weakness (generalized): Secondary | ICD-10-CM | POA: Diagnosis not present

## 2016-12-27 DIAGNOSIS — Z96642 Presence of left artificial hip joint: Secondary | ICD-10-CM | POA: Diagnosis not present

## 2016-12-27 DIAGNOSIS — W19XXXA Unspecified fall, initial encounter: Secondary | ICD-10-CM | POA: Diagnosis not present

## 2016-12-27 DIAGNOSIS — S7292XA Unspecified fracture of left femur, initial encounter for closed fracture: Secondary | ICD-10-CM | POA: Diagnosis not present

## 2016-12-27 DIAGNOSIS — Z419 Encounter for procedure for purposes other than remedying health state, unspecified: Secondary | ICD-10-CM

## 2016-12-27 DIAGNOSIS — R251 Tremor, unspecified: Secondary | ICD-10-CM | POA: Diagnosis present

## 2016-12-27 DIAGNOSIS — Z01818 Encounter for other preprocedural examination: Secondary | ICD-10-CM | POA: Diagnosis not present

## 2016-12-27 DIAGNOSIS — M25512 Pain in left shoulder: Secondary | ICD-10-CM | POA: Diagnosis present

## 2016-12-27 DIAGNOSIS — R9431 Abnormal electrocardiogram [ECG] [EKG]: Secondary | ICD-10-CM | POA: Diagnosis not present

## 2016-12-27 DIAGNOSIS — F418 Other specified anxiety disorders: Secondary | ICD-10-CM | POA: Diagnosis present

## 2016-12-27 DIAGNOSIS — T148XXA Other injury of unspecified body region, initial encounter: Secondary | ICD-10-CM | POA: Diagnosis not present

## 2016-12-27 DIAGNOSIS — R279 Unspecified lack of coordination: Secondary | ICD-10-CM | POA: Diagnosis not present

## 2016-12-27 DIAGNOSIS — Z9181 History of falling: Secondary | ICD-10-CM | POA: Diagnosis not present

## 2016-12-27 DIAGNOSIS — Z09 Encounter for follow-up examination after completed treatment for conditions other than malignant neoplasm: Secondary | ICD-10-CM

## 2016-12-27 DIAGNOSIS — K219 Gastro-esophageal reflux disease without esophagitis: Secondary | ICD-10-CM | POA: Diagnosis not present

## 2016-12-27 DIAGNOSIS — S42212D Unspecified displaced fracture of surgical neck of left humerus, subsequent encounter for fracture with routine healing: Secondary | ICD-10-CM | POA: Diagnosis not present

## 2016-12-27 DIAGNOSIS — M25552 Pain in left hip: Secondary | ICD-10-CM | POA: Diagnosis not present

## 2016-12-27 DIAGNOSIS — S0990XA Unspecified injury of head, initial encounter: Secondary | ICD-10-CM | POA: Diagnosis not present

## 2016-12-27 DIAGNOSIS — E876 Hypokalemia: Secondary | ICD-10-CM | POA: Diagnosis present

## 2016-12-27 DIAGNOSIS — I69354 Hemiplegia and hemiparesis following cerebral infarction affecting left non-dominant side: Secondary | ICD-10-CM

## 2016-12-27 DIAGNOSIS — E785 Hyperlipidemia, unspecified: Secondary | ICD-10-CM | POA: Diagnosis present

## 2016-12-27 DIAGNOSIS — Z7902 Long term (current) use of antithrombotics/antiplatelets: Secondary | ICD-10-CM | POA: Diagnosis not present

## 2016-12-27 DIAGNOSIS — I2510 Atherosclerotic heart disease of native coronary artery without angina pectoris: Secondary | ICD-10-CM | POA: Diagnosis not present

## 2016-12-27 DIAGNOSIS — S72042A Displaced fracture of base of neck of left femur, initial encounter for closed fracture: Secondary | ICD-10-CM | POA: Diagnosis not present

## 2016-12-27 DIAGNOSIS — S199XXA Unspecified injury of neck, initial encounter: Secondary | ICD-10-CM | POA: Diagnosis not present

## 2016-12-27 DIAGNOSIS — S72009A Fracture of unspecified part of neck of unspecified femur, initial encounter for closed fracture: Secondary | ICD-10-CM | POA: Diagnosis not present

## 2016-12-27 DIAGNOSIS — I639 Cerebral infarction, unspecified: Secondary | ICD-10-CM | POA: Diagnosis present

## 2016-12-27 DIAGNOSIS — S72012D Unspecified intracapsular fracture of left femur, subsequent encounter for closed fracture with routine healing: Secondary | ICD-10-CM | POA: Diagnosis not present

## 2016-12-27 LAB — BASIC METABOLIC PANEL
Anion gap: 8 (ref 5–15)
BUN: 27 mg/dL — AB (ref 6–20)
CALCIUM: 10.1 mg/dL (ref 8.9–10.3)
CHLORIDE: 104 mmol/L (ref 101–111)
CO2: 27 mmol/L (ref 22–32)
CREATININE: 0.99 mg/dL (ref 0.44–1.00)
GFR calc non Af Amer: 47 mL/min — ABNORMAL LOW (ref >60)
GFR, EST AFRICAN AMERICAN: 55 mL/min — AB (ref >60)
GLUCOSE: 123 mg/dL — AB (ref 65–99)
Potassium: 3.4 mmol/L — ABNORMAL LOW (ref 3.5–5.1)
Sodium: 139 mmol/L (ref 135–145)

## 2016-12-27 LAB — URINALYSIS, ROUTINE W REFLEX MICROSCOPIC
BILIRUBIN URINE: NEGATIVE
GLUCOSE, UA: NEGATIVE mg/dL
Hgb urine dipstick: NEGATIVE
KETONES UR: NEGATIVE mg/dL
LEUKOCYTES UA: NEGATIVE
Nitrite: NEGATIVE
PH: 8 (ref 5.0–8.0)
PROTEIN: 30 mg/dL — AB
Specific Gravity, Urine: 1.013 (ref 1.005–1.030)

## 2016-12-27 LAB — CBC WITH DIFFERENTIAL/PLATELET
BASOS PCT: 0 %
Basophils Absolute: 0 10*3/uL (ref 0.0–0.1)
EOS ABS: 0.1 10*3/uL (ref 0.0–0.7)
EOS PCT: 0 %
HCT: 39.5 % (ref 36.0–46.0)
Hemoglobin: 13.4 g/dL (ref 12.0–15.0)
LYMPHS ABS: 0.8 10*3/uL (ref 0.7–4.0)
Lymphocytes Relative: 4 %
MCH: 31.8 pg (ref 26.0–34.0)
MCHC: 33.9 g/dL (ref 30.0–36.0)
MCV: 93.6 fL (ref 78.0–100.0)
MONO ABS: 0.6 10*3/uL (ref 0.1–1.0)
MONOS PCT: 3 %
NEUTROS PCT: 93 %
Neutro Abs: 18.7 10*3/uL — ABNORMAL HIGH (ref 1.7–7.7)
Platelets: 196 10*3/uL (ref 150–400)
RBC: 4.22 MIL/uL (ref 3.87–5.11)
RDW: 13.1 % (ref 11.5–15.5)
WBC: 20.2 10*3/uL — ABNORMAL HIGH (ref 4.0–10.5)

## 2016-12-27 LAB — BRAIN NATRIURETIC PEPTIDE: B NATRIURETIC PEPTIDE 5: 101 pg/mL — AB (ref 0.0–100.0)

## 2016-12-27 LAB — APTT: aPTT: 34 seconds (ref 24–36)

## 2016-12-27 LAB — TYPE AND SCREEN
ABO/RH(D): O POS
Antibody Screen: NEGATIVE

## 2016-12-27 LAB — PROTIME-INR
INR: 1.02
PROTHROMBIN TIME: 13.4 s (ref 11.4–15.2)

## 2016-12-27 LAB — ABO/RH: ABO/RH(D): O POS

## 2016-12-27 MED ORDER — MORPHINE SULFATE (PF) 4 MG/ML IV SOLN
1.0000 mg | INTRAVENOUS | Status: AC | PRN
  Administered 2016-12-27: 1 mg via INTRAVENOUS
  Filled 2016-12-27: qty 1

## 2016-12-27 MED ORDER — ACETAMINOPHEN 325 MG PO TABS
650.0000 mg | ORAL_TABLET | Freq: Four times a day (QID) | ORAL | Status: DC | PRN
  Administered 2016-12-29: 650 mg via ORAL
  Filled 2016-12-27: qty 2

## 2016-12-27 MED ORDER — OXYCODONE-ACETAMINOPHEN 5-325 MG PO TABS
1.0000 | ORAL_TABLET | ORAL | Status: DC | PRN
  Administered 2016-12-27 (×2): 1 via ORAL
  Filled 2016-12-27 (×2): qty 1

## 2016-12-27 MED ORDER — OXYBUTYNIN CHLORIDE 5 MG PO TABS: 5.0000 mg | ORAL_TABLET | Freq: Three times a day (TID) | ORAL | Status: DC | PRN

## 2016-12-27 MED ORDER — AMLODIPINE BESYLATE 5 MG PO TABS
5.0000 mg | ORAL_TABLET | Freq: Every day | ORAL | Status: DC
  Administered 2016-12-27 – 2016-12-31 (×3): 5 mg via ORAL
  Filled 2016-12-27 (×4): qty 1

## 2016-12-27 MED ORDER — ONDANSETRON HCL 4 MG/2ML IJ SOLN
4.0000 mg | Freq: Three times a day (TID) | INTRAMUSCULAR | Status: DC | PRN
  Administered 2016-12-27: 4 mg via INTRAVENOUS
  Filled 2016-12-27: qty 2

## 2016-12-27 MED ORDER — POTASSIUM CHLORIDE 20 MEQ/15ML (10%) PO SOLN
20.0000 meq | Freq: Once | ORAL | Status: AC
  Administered 2016-12-27: 20 meq via ORAL
  Filled 2016-12-27: qty 15

## 2016-12-27 MED ORDER — METHOCARBAMOL 500 MG PO TABS: 500.0000 mg | ORAL_TABLET | Freq: Three times a day (TID) | ORAL | Status: DC | PRN

## 2016-12-27 MED ORDER — SERTRALINE HCL 50 MG PO TABS
50.0000 mg | ORAL_TABLET | Freq: Every day | ORAL | Status: DC
  Administered 2016-12-29 – 2016-12-31 (×3): 50 mg via ORAL
  Filled 2016-12-27 (×3): qty 1

## 2016-12-27 MED ORDER — VITAMIN C 500 MG PO TABS
500.0000 mg | ORAL_TABLET | Freq: Every day | ORAL | Status: DC
  Administered 2016-12-29 – 2016-12-31 (×3): 500 mg via ORAL
  Filled 2016-12-27 (×3): qty 1

## 2016-12-27 MED ORDER — MORPHINE SULFATE (PF) 4 MG/ML IV SOLN
4.0000 mg | INTRAVENOUS | Status: DC | PRN
  Administered 2016-12-27: 2 mg via INTRAVENOUS
  Filled 2016-12-27: qty 1

## 2016-12-27 MED ORDER — VITAMIN B-12 100 MCG PO TABS
100.0000 ug | ORAL_TABLET | Freq: Every day | ORAL | Status: DC
Start: 2016-12-28 — End: 2016-12-31
  Administered 2016-12-29 – 2016-12-31 (×3): 100 ug via ORAL
  Filled 2016-12-27 (×4): qty 1

## 2016-12-27 MED ORDER — TRANEXAMIC ACID 1000 MG/10ML IV SOLN
1000.0000 mg | INTRAVENOUS | Status: DC
  Filled 2016-12-27: qty 10

## 2016-12-27 MED ORDER — ADULT MULTIVITAMIN W/MINERALS CH
1.0000 | ORAL_TABLET | Freq: Every day | ORAL | Status: DC
  Administered 2016-12-27 – 2016-12-31 (×4): 1 via ORAL
  Filled 2016-12-27 (×4): qty 1

## 2016-12-27 MED ORDER — POLYETHYLENE GLYCOL 3350 17 G PO PACK: 17.0000 g | PACK | Freq: Every day | ORAL | Status: DC | PRN

## 2016-12-27 MED ORDER — SIMVASTATIN 40 MG PO TABS
40.0000 mg | ORAL_TABLET | Freq: Every day | ORAL | Status: DC
  Administered 2016-12-27 – 2016-12-30 (×3): 40 mg via ORAL
  Filled 2016-12-27 (×3): qty 1

## 2016-12-27 MED ORDER — SODIUM CHLORIDE 0.9 % IV SOLN
INTRAVENOUS | Status: DC
  Administered 2016-12-27 – 2016-12-28 (×2): via INTRAVENOUS

## 2016-12-27 MED ORDER — MEPROBAMATE 400 MG PO TABS: 400.0000 mg | ORAL_TABLET | ORAL | Status: DC | PRN

## 2016-12-27 MED ORDER — HYDRALAZINE HCL 20 MG/ML IJ SOLN: 5.0000 mg | INTRAMUSCULAR | Status: DC | PRN

## 2016-12-27 NOTE — ED Triage Notes (Signed)
[facility] on L side with pain to L shoulder and L hip; pt states she was in the kitchen and reached up for a pot and lost balance causing the fall; pt alert + oriented; 4mg  morphine given by EMS enroute; pain decreased from 6-2

## 2016-12-27 NOTE — Progress Notes (Signed)
[name], female   DOB: 03/11/1921, 83 y.o.   MRN: [id number] Patient was admitted earlier this morning with fall and left femoral neck fracture and left humeral neck fracture. Orthopedics has been consulted. Patient seen and examined at bedside along with family member. Medical records including history and physical was reviewed by myself. Labs and current medications reviewed by myself. We  follow further recommendations from orthopedics. Continue pain management.

## 2016-12-27 NOTE — ED Notes (Signed)
6N informed of pt's difficulty swallowing.  Also informed that this RN is still trying to find someone to bring pt up.

## 2016-12-27 NOTE — ED Notes (Signed)
Attempted report x 1 to 6N 

## 2016-12-27 NOTE — Care Management Note (Signed)
Case Management Note  Patient Details  Name: Debbie Webb MRN: [id number] Date of Birth: 06/01/1920  Subjective/Objective:                  83 y.o. female from home alone with medical history significant of hypertension, hyperlipidemia, stroke with left sided paralysis, depression with anxiety, tremor, carotid artery stenosis (s/p of carotid artery endarterectomy), who presents with pain in the left shoulder and the left hip after fall.  Action/Plan: Admit status INPATIENT (Fracture of femoral neck, left, closed); anticipate discharge SNF.   Expected Discharge Date:   (unknown)               Expected Discharge Plan:  Skilled Nursing Facility  In-House Referral:  Clinical Social Work  Discharge planning Services  CM Consult  Post Acute Care Choice:  NA Choice offered to:     DME Arranged:    DME Agency:     HH Arranged:    HH Agency:     Status of Service:  In process, will continue to follow  If discussed at Long Length of Stay Meetings, dates discussed:    Additional Comments:  Oletta CohnWood, Noelle Hoogland, RN 12/27/2016, 8:58 AM

## 2016-12-27 NOTE — Clinical Social Work Note (Signed)
Clinical Social Work Assessment  Patient Details  Name: Debbie Webb T [place] MRN: [id number] Date of Birth: 10/23/1920  Date of referral:  12/27/16               Reason for consult:  Facility Placement                Permission sought to share information with:    Permission granted to share information::     Name::        Agency::     Relationship::     Contact Information:     Housing/Transportation Living arrangements for the past 2 months:  Single Family Home (from home alone. ) Source of Information:  Patient Patient Interpreter Needed:  None Criminal Activity/Legal Involvement Pertinent to Current Situation/Hospitalization:  No - Comment as needed Significant Relationships:  Adult Children, Other Family Members Lives with:  Self Do you feel safe going back to the place where you live?  Yes Need for family participation in patient care:  Yes (Comment)  Care giving concerns:  CSW spoke with pt at bedside. Pt didn't express any concerns at this time to CSW other than mentioning "I may have to have surgery".     Social Worker assessment / plan: CSW spoke with pt at bedside. During this time CSW was informed by pt that pt is from home alone. Pt didn't state how long pt has lived alone but did name a number of family and friends who check on pt regularly including pt's son. Pt mentioned that pt usually needs little help if any as pt is usually independent and able to care for self. Pt is agreeable to SNF placement if recommended at the time of discharge.   Employment status:  Retired Database administratornsurance information:  Managed Medicare PT Recommendations:  Not assessed at this time Information / Referral to community resources:  Skilled Nursing Facility  Patient/Family's Response to care:  Pt is understanding and agreeable to plan of care at this time.   Patient/Family's Understanding of and Emotional Response to Diagnosis, Current Treatment, and Prognosis:  No further questions or concerns have  been presented at this time.   Emotional Assessment Appearance:  Appears stated age Attitude/Demeanor/Rapport:    Affect (typically observed):  Accepting, Adaptable Orientation:  Oriented to Self, Oriented to Place, Oriented to  Time, Oriented to Situation Alcohol / Substance use:  Not Applicable Psych involvement (Current and /or in the community):  No (Comment)  Discharge Needs  Concerns to be addressed:  No discharge needs identified Readmission within the last 30 days:  No Current discharge risk:  None Barriers to Discharge:  No Barriers Identified   Robb MatarKierra S Cache Bills, LCSWA 12/27/2016, 8:26 AM

## 2016-12-27 NOTE — H&P (Signed)
History and Physical    Debbie Webb:[id number] DOB: 25-Feb-1921 DOA: 12/27/2016  Referring MD/NP/PA:   PCP: [name], MD   Patient coming from:  The patient is coming from home.  At baseline, pt is partially dependent for most of ADL.   Chief Complaint: pain in left shoulder and left hip after fall  HPI: Debbie Webb is a 83 y.o. female with medical history significant of hypertension, hyperlipidemia, stroke with left sided paralysis, depression with anxiety, tremor, carotid artery stenosis (s/p of carotid artery endarterectomy), who presents with pain in the left shoulder and the left hip after fall.  Pt states that she fell when she was in the kitchen and reached up for a pot and lost balance. She injured her left shoulder and left hip, causing pain. Patient did not have head or neck injury. Currently the pain in left shoulder and the left hip is severe, sharp, constant, nonradiating. No numbness in arms or legs. Patient denies chest pain, shortness rest, cough, fever, chills. No GI symptoms or symptoms of UTI.  ED Course: pt was found to have WBC 20.0, INR 1.02, negative urinalysis, potassium 3.4, creatinine normal, O2 sats 90-96% on room air, temperature normal. Chest x-ray has no infiltration. CT of head and C-spine negative for acute abnormalities. X-ray of left shoulder showed acute left humeral neck fracture. X-ray of left pelvis showed mildly displaced subcapital fracture through the left femoral neck. Pt is admitted to MedSurg bed as inpatient. Orthopedic surgeon, Dr. Shon Baton was consulted.  Review of Systems:   General: no fevers, chills, no body weight gain, has fatigue HEENT: no blurry vision, hearing changes or sore throat Respiratory: no dyspnea, coughing, wheezing CV: no chest pain, no palpitations GI: no nausea, vomiting, abdominal pain, diarrhea, constipation GU: no dysuria, burning on urination, increased urinary frequency, hematuria  Ext: no leg edema Neuro:  has left sided paralysis from previous stroke. No vision change or hearing loss. Had fall.  Skin: no rash, no skin tear. MSK:  Heme: No easy bruising.  Travel history: No recent long distant travel.  Allergy: No Known Allergies  Past Medical History:  Diagnosis Date  . Carotid artery disease (HCC)    Status post carotid endarterectomy x2  . Essential hypertension, benign   . Hemiparesis affecting left side as late effect of cerebrovascular accident (HCC) 08/03/2011  . Stroke (HCC) 1997  . Tremor     Past Surgical History:  Procedure Laterality Date  . CAROTID ENDARTERECTOMY     Right    Social History:  reports that she has never smoked. She has never used smokeless tobacco. She reports that she does not drink alcohol or use drugs.  Family History:  Family History  Problem Relation Age of Onset  . Colon cancer Mother   . Heart disease Mother   . Bladder Cancer Brother   . Cirrhosis Brother        Liver cirrhosis     Prior to Admission medications   Medication Sig Start Date End Date Taking? Authorizing Provider  amLODipine (NORVASC) 5 MG tablet Take 5 mg by mouth daily.    [provider]  clopidogrel (PLAVIX) 75 MG tablet Take 75 mg by mouth daily.    [provider]  ENSURE (ENSURE) Take 237 mLs by mouth 2 (two) times daily between meals.    [provider]  hydrochlorothiazide (HYDRODIURIL) 25 MG tablet Take 25 mg by mouth daily.    [provider]  meprobamate (EQUANIL)  400 MG tablet Take 400 mg by mouth as needed for anxiety.    [provider]  Multiple Vitamins-Minerals (CENTRUM SILVER PO) Take by mouth.    [provider]  naproxen sodium (ALEVE) 220 MG tablet Take 220 mg by mouth as needed. For pain    [provider]  sertraline (ZOLOFT) 50 MG tablet Take 50 mg by mouth daily.    [provider]  simvastatin (ZOCOR) 40 MG tablet Take 40 mg by mouth daily.    [provider]    vitamin B-12 (CYANOCOBALAMIN) 100 MCG tablet Take 100 mcg by mouth daily.    [provider]  vitamin C (ASCORBIC ACID) 500 MG tablet Take 500 mg by mouth daily.    [provider]    Physical Exam: Vitals:   12/27/16 0330 12/27/16 0345 12/27/16 0400 12/27/16 0415  BP: (!) 153/62 (!) 115/45 (!) 121/50 (!) 144/54  Pulse: 73 74 69 70  Resp: (!) 25 18 (!) 21 (!) 21  Temp:      TempSrc:      SpO2: 96% 97% 96% 99%   General: Not in acute distress HEENT:       Eyes: PERRL, EOMI, no scleral icterus.       ENT: No discharge from the ears and nose, no pharynx injection, no tonsillar enlargement.        Neck: No JVD, no bruit, no mass felt. Heme: No neck lymph node enlargement. Cardiac: S1/S2, RRR, No murmurs, No gallops or rubs. Respiratory: No rales, wheezing, rhonchi or rubs. GI: Soft, nondistended, nontender, no rebound pain, no organomegaly, BS present. GU: No hematuria Ext: No pitting leg edema bilaterally. 2+DP/PT pulse bilaterally. Musculoskeletal: Has tenderness in the left shoulder and left hip. Left leg is shortened and externally rotated. Skin: No rashes.  Neuro: Alert, oriented X3, cranial nerves II-XII grossly intact, moves all extremities normally.  Psych: Patient is not psychotic, no suicidal or hemocidal ideation.  Labs on Admission: I have personally reviewed following labs and imaging studies  CBC:  Recent Labs Lab 12/27/16 0301  WBC 20.2*  NEUTROABS 18.7*  HGB 13.4  HCT 39.5  MCV 93.6  PLT 196   Basic Metabolic Panel:  Recent Labs Lab 12/27/16 0301  NA 139  K 3.4*  CL 104  CO2 27  GLUCOSE 123*  BUN 27*  CREATININE 0.99  CALCIUM 10.1   GFR: CrCl cannot be calculated (Unknown ideal weight.). Liver Function Tests: No results for input(s): AST, ALT, ALKPHOS, BILITOT, PROT, ALBUMIN in the last 168 hours. No results for input(s): LIPASE, AMYLASE in the last 168 hours. No results for input(s): AMMONIA in the last 168  hours. Coagulation Profile:  Recent Labs Lab 12/27/16 0301  INR 1.02   Cardiac Enzymes: No results for input(s): CKTOTAL, CKMB, CKMBINDEX, TROPONINI in the last 168 hours. BNP (last 3 results) No results for input(s): PROBNP in the last 8760 hours. HbA1C: No results for input(s): HGBA1C in the last 72 hours. CBG: No results for input(s): GLUCAP in the last 168 hours. Lipid Profile: No results for input(s): CHOL, HDL, LDLCALC, TRIG, CHOLHDL, LDLDIRECT in the last 72 hours. Thyroid Function Tests: No results for input(s): TSH, T4TOTAL, FREET4, T3FREE, THYROIDAB in the last 72 hours. Anemia Panel: No results for input(s): VITAMINB12, FOLATE, FERRITIN, TIBC, IRON, RETICCTPCT in the last 72 hours. Urine analysis:    Component Value Date/Time   COLORURINE YELLOW 12/27/2016 0255   APPEARANCEUR CLOUDY (A) 12/27/2016 0255   LABSPEC 1.013  12/27/2016 0255   PHURINE 8.0 12/27/2016 0255   GLUCOSEU NEGATIVE 12/27/2016 0255   HGBUR NEGATIVE 12/27/2016 0255   BILIRUBINUR NEGATIVE 12/27/2016 0255   KETONESUR NEGATIVE 12/27/2016 0255   PROTEINUR 30 (A) 12/27/2016 0255   UROBILINOGEN 0.2 03/29/2015 0650   NITRITE NEGATIVE 12/27/2016 0255   LEUKOCYTESUR NEGATIVE 12/27/2016 0255   Sepsis Labs: @LABRCNTIP (procalcitonin:4,lacticidven:4) )No results found for this or any previous visit (from the past 240 hour(s)).   Radiological Exams on Admission: Ct Head Wo Contrast  Result Date: 12/27/2016 CLINICAL DATA:  Status post fall at home, with concern for head or cervical spine injury. Initial encounter. EXAM: CT HEAD WITHOUT CONTRAST CT CERVICAL SPINE WITHOUT CONTRAST TECHNIQUE: Multidetector CT imaging of the head and cervical spine was performed following the standard protocol without intravenous contrast. Multiplanar CT image reconstructions of the cervical spine were also generated. COMPARISON:  CT of the head performed 12/28/2003 FINDINGS: CT HEAD FINDINGS Brain: No evidence of acute  infarction, hemorrhage, hydrocephalus, extra-axial collection or mass lesion/mass effect. Prominence of the ventricles and sulci reflects mild cortical volume loss. Mild cerebellar atrophy is noted. Scattered periventricular white matter change likely reflects small vessel ischemic microangiopathy. The brainstem and fourth ventricle are within normal limits. The basal ganglia are unremarkable in appearance. The cerebral hemispheres demonstrate grossly normal gray-white differentiation. No mass effect or midline shift is seen. Vascular: No hyperdense vessel or unexpected calcification. Skull: There is no evidence of fracture; visualized osseous structures are unremarkable in appearance. Sinuses/Orbits: The orbits are within normal limits. The paranasal sinuses and mastoid air cells are well-aerated. Other: No significant soft tissue abnormalities are seen. CT CERVICAL SPINE FINDINGS Alignment: Normal. Skull base and vertebrae: No acute fracture. No primary bone lesion or focal pathologic process. Soft tissues and spinal canal: No prevertebral fluid or swelling. No visible canal hematoma. Disc levels: Mild intervertebral disc space narrowing is noted along the lower cervical spine, with scattered anterior and posterior disc osteophyte complexes. Mild degenerative change is noted about the dens. Upper chest: The visualized lung apices are clear. The thyroid gland is somewhat heterogeneous but otherwise unremarkable. Scattered calcification is noted at the carotid bifurcations bilaterally. There is likely moderate to severe luminal narrowing at the proximal left internal carotid artery. Other: No additional soft tissue abnormalities are seen. IMPRESSION: 1. No evidence of traumatic intracranial injury or fracture. 2. No evidence of fracture or subluxation along the cervical spine. 3. Mild cortical volume loss and scattered small vessel ischemic microangiopathy. 4. Mild degenerative change along the lower cervical spine.  5. Scattered calcification at the carotid bifurcations bilaterally. Likely moderate to severe luminal narrowing at the proximal left internal carotid artery. Carotid ultrasound would be helpful for further evaluation, when and as deemed clinically appropriate. Electronically Signed   By: Roanna Raider M.D.   On: 12/27/2016 02:32   Ct Cervical Spine Wo Contrast  Result Date: 12/27/2016 CLINICAL DATA:  Status post fall at home, with concern for head or cervical spine injury. Initial encounter. EXAM: CT HEAD WITHOUT CONTRAST CT CERVICAL SPINE WITHOUT CONTRAST TECHNIQUE: Multidetector CT imaging of the head and cervical spine was performed following the standard protocol without intravenous contrast. Multiplanar CT image reconstructions of the cervical spine were also generated. COMPARISON:  CT of the head performed 12/28/2003 FINDINGS: CT HEAD FINDINGS Brain: No evidence of acute infarction, hemorrhage, hydrocephalus, extra-axial collection or mass lesion/mass effect. Prominence of the ventricles and sulci reflects mild cortical volume loss. Mild cerebellar atrophy is noted. Scattered periventricular white matter change  likely reflects small vessel ischemic microangiopathy. The brainstem and fourth ventricle are within normal limits. The basal ganglia are unremarkable in appearance. The cerebral hemispheres demonstrate grossly normal gray-white differentiation. No mass effect or midline shift is seen. Vascular: No hyperdense vessel or unexpected calcification. Skull: There is no evidence of fracture; visualized osseous structures are unremarkable in appearance. Sinuses/Orbits: The orbits are within normal limits. The paranasal sinuses and mastoid air cells are well-aerated. Other: No significant soft tissue abnormalities are seen. CT CERVICAL SPINE FINDINGS Alignment: Normal. Skull base and vertebrae: No acute fracture. No primary bone lesion or focal pathologic process. Soft tissues and spinal canal: No  prevertebral fluid or swelling. No visible canal hematoma. Disc levels: Mild intervertebral disc space narrowing is noted along the lower cervical spine, with scattered anterior and posterior disc osteophyte complexes. Mild degenerative change is noted about the dens. Upper chest: The visualized lung apices are clear. The thyroid gland is somewhat heterogeneous but otherwise unremarkable. Scattered calcification is noted at the carotid bifurcations bilaterally. There is likely moderate to severe luminal narrowing at the proximal left internal carotid artery. Other: No additional soft tissue abnormalities are seen. IMPRESSION: 1. No evidence of traumatic intracranial injury or fracture. 2. No evidence of fracture or subluxation along the cervical spine. 3. Mild cortical volume loss and scattered small vessel ischemic microangiopathy. 4. Mild degenerative change along the lower cervical spine. 5. Scattered calcification at the carotid bifurcations bilaterally. Likely moderate to severe luminal narrowing at the proximal left internal carotid artery. Carotid ultrasound would be helpful for further evaluation, when and as deemed clinically appropriate. Electronically Signed   By: Roanna RaiderJeffery  Chang M.D.   On: 12/27/2016 02:32   Dg Chest Port 1 View  Result Date: 12/27/2016 CLINICAL DATA:  Preoperative chest radiograph for hip and shoulder fractures. Initial encounter. EXAM: PORTABLE CHEST 1 VIEW COMPARISON:  Chest radiograph performed 02/07/2016 FINDINGS: The lungs are well-aerated. Left midlung scarring is noted. There is no evidence of pleural effusion or pneumothorax. The cardiomediastinal silhouette is is mildly enlarged. The masslike density overlying the right hilum reflects ingested material filling the previously noted esophageal diverticulum. The patient's known left humeral neck fracture is partially characterized. No acute displaced rib fractures are seen. Chronic left-sided rib deformities are noted.  IMPRESSION: 1. Mild left midlung scarring noted. Lungs otherwise clear. No acute displaced rib fracture seen. 2. Mild cardiomegaly. 3. Ingested material seen filling the previously noted esophageal diverticulum, overlying the right hilum. 4. Known left humeral neck fracture is partially characterized. Electronically Signed   By: Roanna RaiderJeffery  Chang M.D.   On: 12/27/2016 03:17   Dg Shoulder Left  Result Date: 12/27/2016 CLINICAL DATA:  Fall with pain on the left side EXAM: LEFT SHOULDER - 2+ VIEW COMPARISON:  01/04/2016 FINDINGS: Old distal left clavicle fracture.  Old left upper rib fractures. Acute left humeral neck fracture with less than 1/2 bone with of central displacement on axillary view. No dislocation. IMPRESSION: 1. Diffuse osteopenia. 2. Acute left humeral neck fracture. No dislocation of the humeral head 3. Old left clavicle and rib fractures Electronically Signed   By: Jasmine PangKim  Fujinaga M.D.   On: 12/27/2016 01:44   Dg Hip Unilat With Pelvis 2-3 Views Left  Result Date: 12/27/2016 CLINICAL DATA:  Status post fall on left side, with left hip pain. Initial encounter. EXAM: DG HIP (WITH OR WITHOUT PELVIS) 2-3V LEFT COMPARISON:  None. FINDINGS: There is a mildly displaced subcapital fracture through the left femoral neck. The left femoral head remains  seated at the acetabulum. No additional fractures are seen. The right hip joint is unremarkable in appearance. Mild degenerative change is noted at the lower lumbar spine. The sacroiliac joints are grossly unremarkable. The visualized bowel gas pattern is grossly unremarkable. Scattered vascular calcifications are seen. IMPRESSION: Mildly displaced subcapital fracture through the left femoral neck. Electronically Signed   By: Roanna Raider M.D.   On: 12/27/2016 01:49     EKG: Independently reviewed. Sinus rhythm, QTC 473, nonspecific T-wave change.    Assessment/Plan Principal Problem:   Fracture of femoral neck, left, closed (HCC) Active  Problems:   Fall   Hypokalemia   Hypertension   Stroke (HCC)   HLD (hyperlipidemia)   Fx humeral neck, left, closed, initial encounter   Depression with anxiety   Fall and fracture of femoral neck-left and Fx humeral neck-left: pt seems to have had a mechanic fall. Patient has moderate pain now. No neurovascular compromise. Orthopedic surgeon, Dr. Shon Baton was consulted.   - will admit to Med-surg bed as inpt - Pain control: morphine prn and percocet - When necessary Zofran for nausea - Robaxin for muscle spasm - type and cross - INR/PTT  Leukocytosis: Likely due to stress-induced demargination. Patient does not have signs of infection. -Follow-up CBC  Hypokalemia: K= 3.4 on admission. - Repleted  HTN: -IV hydralazine when necessary -Continue amlodipine -Hold HCTZ well patient is on NPO  Hx of stroke: with left sided hemiparalysis -Continue Zocor -Hold Plavix for surgery  HLD: -zocor  Depression and anxiety: Stable, no suicidal or homicidal ideations. -Continue home medications: Zoloft and meprobamate   DVT ppx: SCD Code Status: Full code Family Communication:  Yes, patient's  sister-in-law and brother   at bed side Disposition Plan:  Anticipate discharge back to previous rehabilitation facility environment Consults called: ortho, Dr. Shon Baton Admission status:  medical floor/obs     Date of Service 12/27/2016    Lorretta Harp [facility] Pager [phone]  If 7PM-7AM, please contact night-coverage [email or web address] Password [email or web address] 12/27/2016, 4:31 AM

## 2016-12-27 NOTE — Consult Note (Signed)
[name] MRN: [id number] DOB/AGE: 81/08/1920 81 y.o.  Admit date: 12/27/2016  Admission Diagnoses:  Principal Problem:   Fracture of femoral neck, left, closed (HCC) Active Problems:   Fall   Hypokalemia   Hypertension   Stroke (HCC)   HLD (hyperlipidemia)   Fx humeral neck, left, closed, initial encounter   Depression with anxiety   HPI: Pleasant 83 year old female pt with a past medical hx of a stroke with lift sided paralysis, HTN, hyperlipidemia, depression, Carotid artery stenosis.  The pt is anticoagulated on Plavix.  The pt unfortunately sustained a fall in her kitchen.  She reports pain of her left shoulder and left hip. The pt is accompanied by her brother and sister in law at the ED.    Past Medical History: Past Medical History:  Diagnosis Date  . Carotid artery disease (HCC)    Status post carotid endarterectomy x2  . Essential hypertension, benign   . Hemiparesis affecting left side as late effect of cerebrovascular accident (HCC) 08/03/2011  . Stroke (HCC) 1997  . Tremor     Surgical History: Past Surgical History:  Procedure Laterality Date  . CAROTID ENDARTERECTOMY     Right    Family History: Family History  Problem Relation Age of Onset  . Colon cancer Mother   . Heart disease Mother   . Bladder Cancer Brother   . Cirrhosis Brother        Liver cirrhosis    Social History: Social History   Social History  . Marital status: Widowed    Spouse name: N/A  . Number of children: N/A  . Years of education: N/A   Occupational History  . Not on file.   Social History Main Topics  . Smoking status: Never Smoker  . Smokeless tobacco: Never Used  . Alcohol use No  . Drug use: No  . Sexual activity: Not on file   Other Topics Concern  . Not on file   Social History Narrative  . No narrative on file    Allergies: Patient has no known allergies.  Medications: I have reviewed the patient's current  medications.  Vital Signs: Patient Vitals for the past 24 hrs:  BP Temp Temp src Pulse Resp SpO2  12/27/16 0600 (!) 139/51 - - 73 (!) 22 98 %  12/27/16 0545 (!) 148/58 - - 72 (!) 23 97 %  12/27/16 0530 (!) 145/71 - - 69 (!) 23 99 %  12/27/16 0515 (!) 144/57 - - 72 (!) 22 96 %  12/27/16 0500 (!) 137/52 - - 76 (!) 22 98 %  12/27/16 0445 (!) 138/57 - - 65 20 98 %  12/27/16 0430 (!) 133/46 - - 70 (!) 23 95 %  12/27/16 0415 (!) 144/54 - - 70 (!) 21 99 %  12/27/16 0400 (!) 121/50 - - 69 (!) 21 96 %  12/27/16 0345 (!) 115/45 - - 74 18 97 %  12/27/16 0330 (!) 153/62 - - 73 (!) 25 96 %  12/27/16 0230 (!) 134/58 - - 72 (!) 22 96 %  12/27/16 0215 (!) 172/56 - - (!) 39 (!) 24 97 %  12/27/16 0120 (!) 174/63 97.6 F (36.4 C) Oral 76 19 97 %  12/27/16 0111 - - - - - 90 %    Radiology: Ct Head Wo Contrast  Result Date: 12/27/2016 CLINICAL DATA:  Status post fall at home, with concern for head or cervical spine injury. Initial encounter. EXAM:  CT HEAD WITHOUT CONTRAST CT CERVICAL SPINE WITHOUT CONTRAST TECHNIQUE: Multidetector CT imaging of the head and cervical spine was performed following the standard protocol without intravenous contrast. Multiplanar CT image reconstructions of the cervical spine were also generated. COMPARISON:  CT of the head performed 12/28/2003 FINDINGS: CT HEAD FINDINGS Brain: No evidence of acute infarction, hemorrhage, hydrocephalus, extra-axial collection or mass lesion/mass effect. Prominence of the ventricles and sulci reflects mild cortical volume loss. Mild cerebellar atrophy is noted. Scattered periventricular white matter change likely reflects small vessel ischemic microangiopathy. The brainstem and fourth ventricle are within normal limits. The basal ganglia are unremarkable in appearance. The cerebral hemispheres demonstrate grossly normal gray-white differentiation. No mass effect or midline shift is seen. Vascular: No hyperdense vessel or unexpected calcification.  Skull: There is no evidence of fracture; visualized osseous structures are unremarkable in appearance. Sinuses/Orbits: The orbits are within normal limits. The paranasal sinuses and mastoid air cells are well-aerated. Other: No significant soft tissue abnormalities are seen. CT CERVICAL SPINE FINDINGS Alignment: Normal. Skull base and vertebrae: No acute fracture. No primary bone lesion or focal pathologic process. Soft tissues and spinal canal: No prevertebral fluid or swelling. No visible canal hematoma. Disc levels: Mild intervertebral disc space narrowing is noted along the lower cervical spine, with scattered anterior and posterior disc osteophyte complexes. Mild degenerative change is noted about the dens. Upper chest: The visualized lung apices are clear. The thyroid gland is somewhat heterogeneous but otherwise unremarkable. Scattered calcification is noted at the carotid bifurcations bilaterally. There is likely moderate to severe luminal narrowing at the proximal left internal carotid artery. Other: No additional soft tissue abnormalities are seen. IMPRESSION: 1. No evidence of traumatic intracranial injury or fracture. 2. No evidence of fracture or subluxation along the cervical spine. 3. Mild cortical volume loss and scattered small vessel ischemic microangiopathy. 4. Mild degenerative change along the lower cervical spine. 5. Scattered calcification at the carotid bifurcations bilaterally. Likely moderate to severe luminal narrowing at the proximal left internal carotid artery. Carotid ultrasound would be helpful for further evaluation, when and as deemed clinically appropriate. Electronically Signed   By: Roanna Raider M.D.   On: 12/27/2016 02:32   Ct Cervical Spine Wo Contrast  Result Date: 12/27/2016 CLINICAL DATA:  Status post fall at home, with concern for head or cervical spine injury. Initial encounter. EXAM: CT HEAD WITHOUT CONTRAST CT CERVICAL SPINE WITHOUT CONTRAST TECHNIQUE:  Multidetector CT imaging of the head and cervical spine was performed following the standard protocol without intravenous contrast. Multiplanar CT image reconstructions of the cervical spine were also generated. COMPARISON:  CT of the head performed 12/28/2003 FINDINGS: CT HEAD FINDINGS Brain: No evidence of acute infarction, hemorrhage, hydrocephalus, extra-axial collection or mass lesion/mass effect. Prominence of the ventricles and sulci reflects mild cortical volume loss. Mild cerebellar atrophy is noted. Scattered periventricular white matter change likely reflects small vessel ischemic microangiopathy. The brainstem and fourth ventricle are within normal limits. The basal ganglia are unremarkable in appearance. The cerebral hemispheres demonstrate grossly normal gray-white differentiation. No mass effect or midline shift is seen. Vascular: No hyperdense vessel or unexpected calcification. Skull: There is no evidence of fracture; visualized osseous structures are unremarkable in appearance. Sinuses/Orbits: The orbits are within normal limits. The paranasal sinuses and mastoid air cells are well-aerated. Other: No significant soft tissue abnormalities are seen. CT CERVICAL SPINE FINDINGS Alignment: Normal. Skull base and vertebrae: No acute fracture. No primary bone lesion or focal pathologic process. Soft tissues and spinal canal:  No prevertebral fluid or swelling. No visible canal hematoma. Disc levels: Mild intervertebral disc space narrowing is noted along the lower cervical spine, with scattered anterior and posterior disc osteophyte complexes. Mild degenerative change is noted about the dens. Upper chest: The visualized lung apices are clear. The thyroid gland is somewhat heterogeneous but otherwise unremarkable. Scattered calcification is noted at the carotid bifurcations bilaterally. There is likely moderate to severe luminal narrowing at the proximal left internal carotid artery. Other: No additional  soft tissue abnormalities are seen. IMPRESSION: 1. No evidence of traumatic intracranial injury or fracture. 2. No evidence of fracture or subluxation along the cervical spine. 3. Mild cortical volume loss and scattered small vessel ischemic microangiopathy. 4. Mild degenerative change along the lower cervical spine. 5. Scattered calcification at the carotid bifurcations bilaterally. Likely moderate to severe luminal narrowing at the proximal left internal carotid artery. Carotid ultrasound would be helpful for further evaluation, when and as deemed clinically appropriate. Electronically Signed   By: Roanna Raider M.D.   On: 12/27/2016 02:32   Dg Chest Port 1 View  Result Date: 12/27/2016 CLINICAL DATA:  Preoperative chest radiograph for hip and shoulder fractures. Initial encounter. EXAM: PORTABLE CHEST 1 VIEW COMPARISON:  Chest radiograph performed 02/07/2016 FINDINGS: The lungs are well-aerated. Left midlung scarring is noted. There is no evidence of pleural effusion or pneumothorax. The cardiomediastinal silhouette is is mildly enlarged. The masslike density overlying the right hilum reflects ingested material filling the previously noted esophageal diverticulum. The patient's known left humeral neck fracture is partially characterized. No acute displaced rib fractures are seen. Chronic left-sided rib deformities are noted. IMPRESSION: 1. Mild left midlung scarring noted. Lungs otherwise clear. No acute displaced rib fracture seen. 2. Mild cardiomegaly. 3. Ingested material seen filling the previously noted esophageal diverticulum, overlying the right hilum. 4. Known left humeral neck fracture is partially characterized. Electronically Signed   By: Roanna Raider M.D.   On: 12/27/2016 03:17   Dg Shoulder Left  Result Date: 12/27/2016 CLINICAL DATA:  Fall with pain on the left side EXAM: LEFT SHOULDER - 2+ VIEW COMPARISON:  01/04/2016 FINDINGS: Old distal left clavicle fracture.  Old left upper rib  fractures. Acute left humeral neck fracture with less than 1/2 bone with of central displacement on axillary view. No dislocation. IMPRESSION: 1. Diffuse osteopenia. 2. Acute left humeral neck fracture. No dislocation of the humeral head 3. Old left clavicle and rib fractures Electronically Signed   By: Jasmine Pang M.D.   On: 12/27/2016 01:44   Dg Hip Unilat With Pelvis 2-3 Views Left  Result Date: 12/27/2016 CLINICAL DATA:  Status post fall on left side, with left hip pain. Initial encounter. EXAM: DG HIP (WITH OR WITHOUT PELVIS) 2-3V LEFT COMPARISON:  None. FINDINGS: There is a mildly displaced subcapital fracture through the left femoral neck. The left femoral head remains seated at the acetabulum. No additional fractures are seen. The right hip joint is unremarkable in appearance. Mild degenerative change is noted at the lower lumbar spine. The sacroiliac joints are grossly unremarkable. The visualized bowel gas pattern is grossly unremarkable. Scattered vascular calcifications are seen. IMPRESSION: Mildly displaced subcapital fracture through the left femoral neck. Electronically Signed   By: Roanna Raider M.D.   On: 12/27/2016 01:49    Labs:  Recent Labs  12/27/16 0301  WBC 20.2*  RBC 4.22  HCT 39.5  PLT 196    Recent Labs  12/27/16 0301  NA 139  K 3.4*  CL 104  CO2 27  BUN 27*  CREATININE 0.99  GLUCOSE 123*  CALCIUM 10.1    Recent Labs  12/27/16 0301  INR 1.02    Review of Systems: ROS  Physical Exam: Neurologically intact ABD soft Sensation intact distally Compartment soft  Decreased ROM and TTP of the proximal left upper extremity TTP of the Left hip Laterally rotated LLE     Assessment and Plan: NWB on the left lower extremity Admit to floor Pending surgical intervention Dr. Shon Baton has consulted Dr Linna Caprice who will do surgery    [facility] for Venita Lick, MD [facility] [phone]  Agree with above Await medical  clearance Dr Linna Caprice aware

## 2016-12-27 NOTE — ED Notes (Signed)
Bed Placement called.  This patient is now going to 5N, bed to be determined.  Will call report after bed placement calls with an update.

## 2016-12-27 NOTE — ED Notes (Signed)
While giving patient her zocor, she stated that sometimes her esophagus closes off and food and drink get stuck.  Patient took a moment to bring up some stuck food, then took a sip of water, which went down.  She then took the zocor, which got stuck.  Patient eventually got pill down.  Family sts she has been like this since she had a stroke a few years ago. Will call floor about possible SLP consult.

## 2016-12-27 NOTE — ED Notes (Signed)
Patient denies pain and is sleeping comfortably.  

## 2016-12-27 NOTE — ED Provider Notes (Signed)
[facility]-EMERGENCY DEPT Provider Note   CSN: [id number] Arrival date & time: 12/27/16  0105     History   Chief Complaint Chief Complaint  Patient presents with  . Fall  . Hip Pain  . Shoulder Pain    HPI Debbie Webb is a 83 y.o. female.  The history is provided by the patient.  she has history of stroke with left-sided weakness and has spells difficulty. She fell at home injuring her left shoulder and left hip. She was unable to stand up following the fall. She also hit her head, but denies loss of consciousness. She is not on any anticoagulants.  Past Medical History:  Diagnosis Date  . Carotid artery disease (HCC)    Status post carotid endarterectomy x2  . Essential hypertension, benign   . Hemiparesis affecting left side as late effect of cerebrovascular accident (HCC) 08/03/2011  . Stroke (HCC) 1997  . Tremor     Patient Active Problem List   Diagnosis Date Noted  . Aortic stenosis 09/12/2012  . GERD (gastroesophageal reflux disease) 01/26/2012  . Atypical chest pain 01/25/2012  . Hypertension   . Carotid artery disease (HCC)   . Ankle sprain 08/23/2011  . Hypothermia 08/03/2011  . Fall 08/03/2011  . Hypokalemia 08/03/2011  . Leukocytosis 08/03/2011  . Azotemia 08/03/2011  . Left hip pain 08/03/2011  . Personal history of stroke with residual effects 08/03/2011  . Hemiparesis affecting left side as late effect of cerebrovascular accident (HCC) 08/03/2011  . Bradycardia 08/03/2011  . Hand ulceration (HCC) 08/03/2011  . Elevated LFTs 08/03/2011  . Elevated troponin I level 08/03/2011  . Rhabdomyolysis 08/03/2011    Past Surgical History:  Procedure Laterality Date  . CAROTID ENDARTERECTOMY     Right    OB History    No data available       Home Medications    Prior to Admission medications   Medication Sig Start Date End Date Taking? Authorizing Provider  amLODipine (NORVASC) 5 MG tablet Take 5 mg by mouth daily.    [provider]    clopidogrel (PLAVIX) 75 MG tablet Take 75 mg by mouth daily.    [provider]  ENSURE (ENSURE) Take 237 mLs by mouth 2 (two) times daily between meals.    [provider]  hydrochlorothiazide (HYDRODIURIL) 25 MG tablet Take 25 mg by mouth daily.    [provider]  meprobamate (EQUANIL) 400 MG tablet Take 400 mg by mouth as needed for anxiety.    [provider]  Multiple Vitamins-Minerals (CENTRUM SILVER PO) Take by mouth.    [provider]  naproxen sodium (ALEVE) 220 MG tablet Take 220 mg by mouth as needed. For pain    [provider]  sertraline (ZOLOFT) 50 MG tablet Take 50 mg by mouth daily.    [provider]  simvastatin (ZOCOR) 40 MG tablet Take 40 mg by mouth daily.    [provider]  vitamin B-12 (CYANOCOBALAMIN) 100 MCG tablet Take 100 mcg by mouth daily.    [provider]  vitamin C (ASCORBIC ACID) 500 MG tablet Take 500 mg by mouth daily.    [provider]    Family History History reviewed. No pertinent family history.  Social History Social History  Substance Use Topics  . Smoking status: Never Smoker  . Smokeless tobacco: Never Used  . Alcohol use No     Allergies   Patient has no known allergies.   Review of  Systems Review of Systems  All other systems reviewed and are negative.    Physical Exam Updated Vital Signs BP (!) 174/63   Pulse 76   Temp 97.6 F (36.4 C) (Oral)   Resp 19   SpO2 97%   Physical Exam  Nursing note and vitals reviewed.  83 year old female, resting comfortably and in no acute distress. Vital signs are significant for hypertension. Oxygen saturation is 97%, which is normal. Head is normocephalic and atraumatic. PERRLA, EOMI. Oropharynx is clear. Neck is nontender and supple without adenopathy or JVD. Back is nontender and there is no CVA tenderness. Lungs are clear without rales, wheezes, or rhonchi. Chest is nontender. Heart has  regular rate and rhythm without murmur. Abdomen is soft, flat, nontender without masses or hepatosplenomegaly and peristalsis is normoactive. Extremities: there is tenderness palpation of the left shoulder without obvious deformity. There is pain on range of motion. There is pain on range of motion of the left hip with no tenderness to palpation. There is no tenderness to palpation over the pelvic brim. Skin is warm and dry without rash. Neurologic: Mental status is normal, cranial nerves are intact, there are no motor or sensory deficits.  ED Treatments / Results  Labs (all labs ordered are listed, but only abnormal results are displayed) Labs Reviewed  BASIC METABOLIC PANEL - Abnormal; Notable for the following:       Result Value   Potassium 3.4 (*)    Glucose, Bld 123 (*)    BUN 27 (*)    GFR calc non Af Amer 47 (*)    GFR calc Af Amer 55 (*)    All other components within normal limits  CBC WITH DIFFERENTIAL/PLATELET - Abnormal; Notable for the following:    WBC 20.2 (*)    Neutro Abs 18.7 (*)    All other components within normal limits  URINALYSIS, ROUTINE W REFLEX MICROSCOPIC - Abnormal; Notable for the following:    APPearance CLOUDY (*)    Protein, ur 30 (*)    Bacteria, UA RARE (*)    Squamous Epithelial / LPF 0-5 (*)    All other components within normal limits  URINE CULTURE  PROTIME-INR  TYPE AND SCREEN  ABO/RH    EKG  EKG Interpretation  Date/Time:  Thursday December 27 2016 03:27:33 EDT Ventricular Rate:  79 PR Interval:    QRS Duration: 104 QT Interval:  412 QTC Calculation: 473 R Axis:   38 Text Interpretation:  Sinus rhythm Borderline T wave abnormalities When compared with ECG of 01/25/2012, No significant change was found Confirmed by Dione Booze ([id number]) on 12/27/2016 3:42:20 AM       Radiology Ct Head Wo Contrast  Result Date: 12/27/2016 CLINICAL DATA:  Status post fall at home, with concern for head or cervical spine injury. Initial encounter.  EXAM: CT HEAD WITHOUT CONTRAST CT CERVICAL SPINE WITHOUT CONTRAST TECHNIQUE: Multidetector CT imaging of the head and cervical spine was performed following the standard protocol without intravenous contrast. Multiplanar CT image reconstructions of the cervical spine were also generated. COMPARISON:  CT of the head performed 12/28/2003 FINDINGS: CT HEAD FINDINGS Brain: No evidence of acute infarction, hemorrhage, hydrocephalus, extra-axial collection or mass lesion/mass effect. Prominence of the ventricles and sulci reflects mild cortical volume loss. Mild cerebellar atrophy is noted. Scattered periventricular white matter change likely reflects small vessel ischemic microangiopathy. The brainstem and fourth ventricle are within normal limits. The basal ganglia are unremarkable in appearance. The cerebral hemispheres demonstrate  grossly normal gray-white differentiation. No mass effect or midline shift is seen. Vascular: No hyperdense vessel or unexpected calcification. Skull: There is no evidence of fracture; visualized osseous structures are unremarkable in appearance. Sinuses/Orbits: The orbits are within normal limits. The paranasal sinuses and mastoid air cells are well-aerated. Other: No significant soft tissue abnormalities are seen. CT CERVICAL SPINE FINDINGS Alignment: Normal. Skull base and vertebrae: No acute fracture. No primary bone lesion or focal pathologic process. Soft tissues and spinal canal: No prevertebral fluid or swelling. No visible canal hematoma. Disc levels: Mild intervertebral disc space narrowing is noted along the lower cervical spine, with scattered anterior and posterior disc osteophyte complexes. Mild degenerative change is noted about the dens. Upper chest: The visualized lung apices are clear. The thyroid gland is somewhat heterogeneous but otherwise unremarkable. Scattered calcification is noted at the carotid bifurcations bilaterally. There is likely moderate to severe luminal  narrowing at the proximal left internal carotid artery. Other: No additional soft tissue abnormalities are seen. IMPRESSION: 1. No evidence of traumatic intracranial injury or fracture. 2. No evidence of fracture or subluxation along the cervical spine. 3. Mild cortical volume loss and scattered small vessel ischemic microangiopathy. 4. Mild degenerative change along the lower cervical spine. 5. Scattered calcification at the carotid bifurcations bilaterally. Likely moderate to severe luminal narrowing at the proximal left internal carotid artery. Carotid ultrasound would be helpful for further evaluation, when and as deemed clinically appropriate. Electronically Signed   By: Roanna Raider M.D.   On: 12/27/2016 02:32   Ct Cervical Spine Wo Contrast  Result Date: 12/27/2016 CLINICAL DATA:  Status post fall at home, with concern for head or cervical spine injury. Initial encounter. EXAM: CT HEAD WITHOUT CONTRAST CT CERVICAL SPINE WITHOUT CONTRAST TECHNIQUE: Multidetector CT imaging of the head and cervical spine was performed following the standard protocol without intravenous contrast. Multiplanar CT image reconstructions of the cervical spine were also generated. COMPARISON:  CT of the head performed 12/28/2003 FINDINGS: CT HEAD FINDINGS Brain: No evidence of acute infarction, hemorrhage, hydrocephalus, extra-axial collection or mass lesion/mass effect. Prominence of the ventricles and sulci reflects mild cortical volume loss. Mild cerebellar atrophy is noted. Scattered periventricular white matter change likely reflects small vessel ischemic microangiopathy. The brainstem and fourth ventricle are within normal limits. The basal ganglia are unremarkable in appearance. The cerebral hemispheres demonstrate grossly normal gray-white differentiation. No mass effect or midline shift is seen. Vascular: No hyperdense vessel or unexpected calcification. Skull: There is no evidence of fracture; visualized osseous  structures are unremarkable in appearance. Sinuses/Orbits: The orbits are within normal limits. The paranasal sinuses and mastoid air cells are well-aerated. Other: No significant soft tissue abnormalities are seen. CT CERVICAL SPINE FINDINGS Alignment: Normal. Skull base and vertebrae: No acute fracture. No primary bone lesion or focal pathologic process. Soft tissues and spinal canal: No prevertebral fluid or swelling. No visible canal hematoma. Disc levels: Mild intervertebral disc space narrowing is noted along the lower cervical spine, with scattered anterior and posterior disc osteophyte complexes. Mild degenerative change is noted about the dens. Upper chest: The visualized lung apices are clear. The thyroid gland is somewhat heterogeneous but otherwise unremarkable. Scattered calcification is noted at the carotid bifurcations bilaterally. There is likely moderate to severe luminal narrowing at the proximal left internal carotid artery. Other: No additional soft tissue abnormalities are seen. IMPRESSION: 1. No evidence of traumatic intracranial injury or fracture. 2. No evidence of fracture or subluxation along the cervical spine. 3. Mild cortical  volume loss and scattered small vessel ischemic microangiopathy. 4. Mild degenerative change along the lower cervical spine. 5. Scattered calcification at the carotid bifurcations bilaterally. Likely moderate to severe luminal narrowing at the proximal left internal carotid artery. Carotid ultrasound would be helpful for further evaluation, when and as deemed clinically appropriate. Electronically Signed   By: Roanna RaiderJeffery  Chang M.D.   On: 12/27/2016 02:32   Dg Chest Port 1 View  Result Date: 12/27/2016 CLINICAL DATA:  Preoperative chest radiograph for hip and shoulder fractures. Initial encounter. EXAM: PORTABLE CHEST 1 VIEW COMPARISON:  Chest radiograph performed 02/07/2016 FINDINGS: The lungs are well-aerated. Left midlung scarring is noted. There is no evidence  of pleural effusion or pneumothorax. The cardiomediastinal silhouette is is mildly enlarged. The masslike density overlying the right hilum reflects ingested material filling the previously noted esophageal diverticulum. The patient's known left humeral neck fracture is partially characterized. No acute displaced rib fractures are seen. Chronic left-sided rib deformities are noted. IMPRESSION: 1. Mild left midlung scarring noted. Lungs otherwise clear. No acute displaced rib fracture seen. 2. Mild cardiomegaly. 3. Ingested material seen filling the previously noted esophageal diverticulum, overlying the right hilum. 4. Known left humeral neck fracture is partially characterized. Electronically Signed   By: Roanna RaiderJeffery  Chang M.D.   On: 12/27/2016 03:17   Dg Shoulder Left  Result Date: 12/27/2016 CLINICAL DATA:  Fall with pain on the left side EXAM: LEFT SHOULDER - 2+ VIEW COMPARISON:  01/04/2016 FINDINGS: Old distal left clavicle fracture.  Old left upper rib fractures. Acute left humeral neck fracture with less than 1/2 bone with of central displacement on axillary view. No dislocation. IMPRESSION: 1. Diffuse osteopenia. 2. Acute left humeral neck fracture. No dislocation of the humeral head 3. Old left clavicle and rib fractures Electronically Signed   By: Jasmine PangKim  Fujinaga M.D.   On: 12/27/2016 01:44   Dg Hip Unilat With Pelvis 2-3 Views Left  Result Date: 12/27/2016 CLINICAL DATA:  Status post fall on left side, with left hip pain. Initial encounter. EXAM: DG HIP (WITH OR WITHOUT PELVIS) 2-3V LEFT COMPARISON:  None. FINDINGS: There is a mildly displaced subcapital fracture through the left femoral neck. The left femoral head remains seated at the acetabulum. No additional fractures are seen. The right hip joint is unremarkable in appearance. Mild degenerative change is noted at the lower lumbar spine. The sacroiliac joints are grossly unremarkable. The visualized bowel gas pattern is grossly unremarkable.  Scattered vascular calcifications are seen. IMPRESSION: Mildly displaced subcapital fracture through the left femoral neck. Electronically Signed   By: Roanna RaiderJeffery  Chang M.D.   On: 12/27/2016 01:49    Procedures Procedures (including critical care time)  Medications Ordered in ED Medications  morphine 4 MG/ML injection 4 mg (2 mg Intravenous Given 12/27/16 0335)     Initial Impression / Assessment and Plan / ED Course  I have reviewed the triage vital signs and the nursing notes.  Pertinent labs & imaging results that were available during my care of the patient were reviewed by me and considered in my medical decision making (see chart for details).  Fall with injury to left hip and left shoulder. She x-rays of an ordered. Will also get x-rays of cervical spine and head. Old records are reviewed, and she does have past ED visits for falls.  X-rays confirm femoral neck fracture and humeral neck fracture. Case is discussed with Dr. Clyde LundborgNiu of [facility] who agrees to admit the patient. Case is discussed with Dr. Shon BatonBrooks  of service who agrees to see the patient consultation. Of note, WBC is markedly elevated at 20.2. This is felt to be a stress reaction and not indicating infection.  Final Clinical Impressions(s) / ED Diagnoses   Final diagnoses:  Fall at home, initial encounter  Closed fracture of neck of left femur, initial encounter (HCC)  Closed fracture of anatomical neck of left humerus, initial encounter    New Prescriptions New Prescriptions   No medications on file     Dione Booze, MD 12/27/16 0400

## 2016-12-28 ENCOUNTER — Inpatient Hospital Stay (HOSPITAL_COMMUNITY): Payer: Medicare Other | Admitting: Certified Registered"

## 2016-12-28 ENCOUNTER — Inpatient Hospital Stay (HOSPITAL_COMMUNITY): Payer: Medicare Other

## 2016-12-28 ENCOUNTER — Encounter (HOSPITAL_COMMUNITY)
Admission: EM | Disposition: A | Discharge status: Skilled Nursing Facility | Payer: Self-pay | Source: Home / Self Care | Attending: Internal Medicine

## 2016-12-28 ENCOUNTER — Encounter (HOSPITAL_COMMUNITY): Payer: Self-pay | Admitting: Certified Registered"

## 2016-12-28 DIAGNOSIS — S42292A Other displaced fracture of upper end of left humerus, initial encounter for closed fracture: Secondary | ICD-10-CM

## 2016-12-28 DIAGNOSIS — E785 Hyperlipidemia, unspecified: Secondary | ICD-10-CM

## 2016-12-28 DIAGNOSIS — W19XXXD Unspecified fall, subsequent encounter: Secondary | ICD-10-CM

## 2016-12-28 DIAGNOSIS — I10 Essential (primary) hypertension: Secondary | ICD-10-CM

## 2016-12-28 DIAGNOSIS — S72002A Fracture of unspecified part of neck of left femur, initial encounter for closed fracture: Secondary | ICD-10-CM | POA: Diagnosis present

## 2016-12-28 HISTORY — PX: ANTERIOR APPROACH HEMI HIP ARTHROPLASTY: SHX6690

## 2016-12-28 LAB — CBC WITH DIFFERENTIAL/PLATELET
BASOS PCT: 0 %
Basophils Absolute: 0 10*3/uL (ref 0.0–0.1)
EOS ABS: 0.7 10*3/uL (ref 0.0–0.7)
Eosinophils Relative: 5 %
HCT: 33.1 % — ABNORMAL LOW (ref 36.0–46.0)
HEMOGLOBIN: 10.9 g/dL — AB (ref 12.0–15.0)
Lymphocytes Relative: 4 %
Lymphs Abs: 0.6 10*3/uL — ABNORMAL LOW (ref 0.7–4.0)
MCH: 31.4 pg (ref 26.0–34.0)
MCHC: 32.9 g/dL (ref 30.0–36.0)
MCV: 95.4 fL (ref 78.0–100.0)
MONOS PCT: 6 %
Monocytes Absolute: 0.8 10*3/uL (ref 0.1–1.0)
NEUTROS PCT: 85 %
Neutro Abs: 11.7 10*3/uL — ABNORMAL HIGH (ref 1.7–7.7)
Platelets: 144 10*3/uL — ABNORMAL LOW (ref 150–400)
RBC: 3.47 MIL/uL — ABNORMAL LOW (ref 3.87–5.11)
RDW: 13.2 % (ref 11.5–15.5)
WBC: 13.8 10*3/uL — AB (ref 4.0–10.5)

## 2016-12-28 LAB — CREATININE, SERUM
CREATININE: 1.09 mg/dL — AB (ref 0.44–1.00)
GFR calc Af Amer: 49 mL/min — ABNORMAL LOW (ref >60)
GFR calc non Af Amer: 42 mL/min — ABNORMAL LOW (ref >60)

## 2016-12-28 LAB — CBC
HCT: 29.8 % — ABNORMAL LOW (ref 36.0–46.0)
Hemoglobin: 9.7 g/dL — ABNORMAL LOW (ref 12.0–15.0)
MCH: 31.1 pg (ref 26.0–34.0)
MCHC: 32.6 g/dL (ref 30.0–36.0)
MCV: 95.5 fL (ref 78.0–100.0)
PLATELETS: 122 10*3/uL — AB (ref 150–400)
RBC: 3.12 MIL/uL — AB (ref 3.87–5.11)
RDW: 13.3 % (ref 11.5–15.5)
WBC: 18.3 10*3/uL — ABNORMAL HIGH (ref 4.0–10.5)

## 2016-12-28 LAB — BASIC METABOLIC PANEL
Anion gap: 5 (ref 5–15)
BUN: 27 mg/dL — ABNORMAL HIGH (ref 6–20)
CALCIUM: 9 mg/dL (ref 8.9–10.3)
CHLORIDE: 107 mmol/L (ref 101–111)
CO2: 27 mmol/L (ref 22–32)
CREATININE: 0.98 mg/dL (ref 0.44–1.00)
GFR, EST AFRICAN AMERICAN: 55 mL/min — AB (ref >60)
GFR, EST NON AFRICAN AMERICAN: 48 mL/min — AB (ref >60)
Glucose, Bld: 101 mg/dL — ABNORMAL HIGH (ref 65–99)
Potassium: 4.1 mmol/L (ref 3.5–5.1)
SODIUM: 139 mmol/L (ref 135–145)

## 2016-12-28 LAB — SURGICAL PCR SCREEN
MRSA, PCR: NEGATIVE
Staphylococcus aureus: NEGATIVE

## 2016-12-28 LAB — URINE CULTURE

## 2016-12-28 LAB — MAGNESIUM: MAGNESIUM: 1.9 mg/dL (ref 1.7–2.4)

## 2016-12-28 SURGERY — HEMIARTHROPLASTY, HIP, DIRECT ANTERIOR APPROACH, FOR FRACTURE
Anesthesia: General | Laterality: Left

## 2016-12-28 MED ORDER — PROPOFOL 10 MG/ML IV BOLUS
INTRAVENOUS | Status: AC
  Filled 2016-12-28: qty 20

## 2016-12-28 MED ORDER — SENNA 8.6 MG PO TABS
1.0000 | ORAL_TABLET | Freq: Two times a day (BID) | ORAL | Status: DC
  Administered 2016-12-28 – 2016-12-31 (×5): 8.6 mg via ORAL
  Filled 2016-12-28 (×5): qty 1

## 2016-12-28 MED ORDER — CEFAZOLIN SODIUM-DEXTROSE 2-4 GM/100ML-% IV SOLN
2.0000 g | INTRAVENOUS | Status: AC
  Administered 2016-12-28: 2 g via INTRAVENOUS
  Filled 2016-12-28: qty 100

## 2016-12-28 MED ORDER — HYDROCODONE-ACETAMINOPHEN 5-325 MG PO TABS
1.0000 | ORAL_TABLET | Freq: Four times a day (QID) | ORAL | Status: DC | PRN
  Administered 2016-12-29 (×2): 1 via ORAL
  Filled 2016-12-28 (×2): qty 1

## 2016-12-28 MED ORDER — FENTANYL CITRATE (PF) 250 MCG/5ML IJ SOLN
INTRAMUSCULAR | Status: AC
  Filled 2016-12-28: qty 5

## 2016-12-28 MED ORDER — ONDANSETRON HCL 4 MG/2ML IJ SOLN
INTRAMUSCULAR | Status: DC | PRN
  Administered 2016-12-28: 4 mg via INTRAVENOUS

## 2016-12-28 MED ORDER — PROPOFOL 10 MG/ML IV BOLUS
INTRAVENOUS | Status: DC | PRN
  Administered 2016-12-28: 110 mg via INTRAVENOUS

## 2016-12-28 MED ORDER — SUGAMMADEX SODIUM 200 MG/2ML IV SOLN
INTRAVENOUS | Status: DC | PRN
  Administered 2016-12-28: 150 mg via INTRAVENOUS

## 2016-12-28 MED ORDER — HYDROMORPHONE HCL 1 MG/ML IJ SOLN: 0.2500 mg | INTRAMUSCULAR | Status: DC | PRN

## 2016-12-28 MED ORDER — 0.9 % SODIUM CHLORIDE (POUR BTL) OPTIME
TOPICAL | Status: DC | PRN
  Administered 2016-12-28: 1000 mL

## 2016-12-28 MED ORDER — SODIUM CHLORIDE 0.9 % IR SOLN
Status: DC | PRN
  Administered 2016-12-28: 3000 mL
  Administered 2016-12-28: 250 mL

## 2016-12-28 MED ORDER — ONDANSETRON HCL 4 MG/2ML IJ SOLN
INTRAMUSCULAR | Status: AC
  Filled 2016-12-28: qty 2

## 2016-12-28 MED ORDER — LACTATED RINGERS IV SOLN
INTRAVENOUS | Status: DC
  Administered 2016-12-28: 09:00:00 via INTRAVENOUS

## 2016-12-28 MED ORDER — KETOROLAC TROMETHAMINE 30 MG/ML IJ SOLN
INTRAMUSCULAR | Status: DC | PRN
  Administered 2016-12-28: 30 mg

## 2016-12-28 MED ORDER — OXYCODONE HCL 5 MG/5ML PO SOLN: 5.0000 mg | Freq: Once | ORAL | Status: DC | PRN

## 2016-12-28 MED ORDER — ESMOLOL HCL 100 MG/10ML IV SOLN
INTRAVENOUS | Status: DC | PRN
  Administered 2016-12-28: 5 mg via INTRAVENOUS

## 2016-12-28 MED ORDER — METOCLOPRAMIDE HCL 5 MG PO TABS: 5.0000 mg | ORAL_TABLET | Freq: Three times a day (TID) | ORAL | Status: DC | PRN

## 2016-12-28 MED ORDER — BUPIVACAINE-EPINEPHRINE (PF) 0.5% -1:200000 IJ SOLN
INTRAMUSCULAR | Status: AC
  Filled 2016-12-28: qty 30

## 2016-12-28 MED ORDER — ENOXAPARIN SODIUM 30 MG/0.3ML ~~LOC~~ SOLN
30.0000 mg | SUBCUTANEOUS | Status: DC
  Administered 2016-12-29 – 2016-12-31 (×3): 30 mg via SUBCUTANEOUS
  Filled 2016-12-28 (×3): qty 0.3

## 2016-12-28 MED ORDER — SUGAMMADEX SODIUM 200 MG/2ML IV SOLN
INTRAVENOUS | Status: AC
  Filled 2016-12-28: qty 2

## 2016-12-28 MED ORDER — ACETAMINOPHEN 10 MG/ML IV SOLN: 1000.0000 mg | INTRAVENOUS | Status: DC

## 2016-12-28 MED ORDER — LIDOCAINE 2% (20 MG/ML) 5 ML SYRINGE
INTRAMUSCULAR | Status: DC | PRN
  Administered 2016-12-28: 60 mg via INTRAVENOUS

## 2016-12-28 MED ORDER — FENTANYL CITRATE (PF) 250 MCG/5ML IJ SOLN
INTRAMUSCULAR | Status: DC | PRN
  Administered 2016-12-28 (×2): 50 ug via INTRAVENOUS

## 2016-12-28 MED ORDER — CHLORHEXIDINE GLUCONATE 4 % EX LIQD
60.0000 mL | Freq: Once | CUTANEOUS | Status: AC
  Administered 2016-12-28: 4 via TOPICAL
  Filled 2016-12-28: qty 60

## 2016-12-28 MED ORDER — DOCUSATE SODIUM 100 MG PO CAPS
100.0000 mg | ORAL_CAPSULE | Freq: Two times a day (BID) | ORAL | Status: DC
  Administered 2016-12-28 – 2016-12-31 (×5): 100 mg via ORAL
  Filled 2016-12-28 (×6): qty 1

## 2016-12-28 MED ORDER — ROCURONIUM BROMIDE 10 MG/ML (PF) SYRINGE
PREFILLED_SYRINGE | INTRAVENOUS | Status: DC | PRN
  Administered 2016-12-28: 50 mg via INTRAVENOUS

## 2016-12-28 MED ORDER — PHENYLEPHRINE 40 MCG/ML (10ML) SYRINGE FOR IV PUSH (FOR BLOOD PRESSURE SUPPORT)
PREFILLED_SYRINGE | INTRAVENOUS | Status: DC | PRN
  Administered 2016-12-28 (×2): 80 ug via INTRAVENOUS

## 2016-12-28 MED ORDER — LIDOCAINE 2% (20 MG/ML) 5 ML SYRINGE
INTRAMUSCULAR | Status: AC
  Filled 2016-12-28: qty 5

## 2016-12-28 MED ORDER — POVIDONE-IODINE 10 % EX SWAB: 2.0000 "application " | Freq: Once | CUTANEOUS | Status: DC

## 2016-12-28 MED ORDER — SODIUM CHLORIDE 0.9 % IJ SOLN
INTRAMUSCULAR | Status: DC | PRN
  Administered 2016-12-28: 10 mL

## 2016-12-28 MED ORDER — PROMETHAZINE HCL 25 MG/ML IJ SOLN: 6.2500 mg | INTRAMUSCULAR | Status: DC | PRN

## 2016-12-28 MED ORDER — MORPHINE SULFATE (PF) 4 MG/ML IV SOLN
0.5000 mg | INTRAVENOUS | Status: DC | PRN
  Administered 2016-12-29: 0.52 mg via INTRAVENOUS
  Filled 2016-12-28: qty 1

## 2016-12-28 MED ORDER — CEFAZOLIN SODIUM-DEXTROSE 2-4 GM/100ML-% IV SOLN
2.0000 g | Freq: Four times a day (QID) | INTRAVENOUS | Status: AC
  Administered 2016-12-28 (×2): 2 g via INTRAVENOUS
  Filled 2016-12-28 (×2): qty 100

## 2016-12-28 MED ORDER — PHENYLEPHRINE HCL 10 MG/ML IJ SOLN
INTRAMUSCULAR | Status: DC | PRN
  Administered 2016-12-28: 40 ug/min via INTRAVENOUS

## 2016-12-28 MED ORDER — METOCLOPRAMIDE HCL 5 MG/ML IJ SOLN: 5.0000 mg | Freq: Three times a day (TID) | INTRAMUSCULAR | Status: DC | PRN

## 2016-12-28 MED ORDER — ENSURE ENLIVE PO LIQD
237.0000 mL | Freq: Two times a day (BID) | ORAL | Status: DC
  Administered 2016-12-29 – 2016-12-31 (×5): 237 mL via ORAL

## 2016-12-28 MED ORDER — BUPIVACAINE-EPINEPHRINE (PF) 0.5% -1:200000 IJ SOLN
INTRAMUSCULAR | Status: DC | PRN
  Administered 2016-12-28: 30 mL via PERINEURAL

## 2016-12-28 MED ORDER — KETOROLAC TROMETHAMINE 30 MG/ML IJ SOLN
INTRAMUSCULAR | Status: AC
  Filled 2016-12-28: qty 1

## 2016-12-28 MED ORDER — ESMOLOL HCL 100 MG/10ML IV SOLN
INTRAVENOUS | Status: AC
  Filled 2016-12-28: qty 10

## 2016-12-28 MED ORDER — OXYCODONE HCL 5 MG PO TABS: 5.0000 mg | ORAL_TABLET | Freq: Once | ORAL | Status: DC | PRN

## 2016-12-28 MED ORDER — PHENOL 1.4 % MT LIQD: 1.0000 | OROMUCOSAL | Status: DC | PRN

## 2016-12-28 MED ORDER — MENTHOL 3 MG MT LOZG: 1.0000 | LOZENGE | OROMUCOSAL | Status: DC | PRN

## 2016-12-28 MED ORDER — EPHEDRINE SULFATE-NACL 50-0.9 MG/10ML-% IV SOSY
PREFILLED_SYRINGE | INTRAVENOUS | Status: DC | PRN
Start: 2016-12-28 — End: 2016-12-28
  Administered 2016-12-28: 10 mg via INTRAVENOUS

## 2016-12-28 SURGICAL SUPPLY — 52 items
ADH SKN CLS APL DERMABOND .7 (GAUZE/BANDAGES/DRESSINGS) ×1
BLADE CLIPPER SURG (BLADE) IMPLANT
BLADE SAW SGTL 18X1.27X75 (BLADE) ×2 IMPLANT
CAPT HIP HEMI 2 ×1 IMPLANT
CHLORAPREP W/TINT 26ML (MISCELLANEOUS) ×2 IMPLANT
COVER SURGICAL LIGHT HANDLE (MISCELLANEOUS) ×2 IMPLANT
DERMABOND ADVANCED (GAUZE/BANDAGES/DRESSINGS) ×1
DERMABOND ADVANCED .7 DNX12 (GAUZE/BANDAGES/DRESSINGS) ×2 IMPLANT
DRAPE C-ARM 42X72 X-RAY (DRAPES) ×2 IMPLANT
DRAPE IMP U-DRAPE 54X76 (DRAPES) ×4 IMPLANT
DRAPE STERI IOBAN 125X83 (DRAPES) ×2 IMPLANT
DRAPE U-SHAPE 47X51 STRL (DRAPES) ×6 IMPLANT
DRSG AQUACEL AG ADV 3.5X10 (GAUZE/BANDAGES/DRESSINGS) ×2 IMPLANT
ELECT BLADE 4.0 EZ CLEAN MEGAD (MISCELLANEOUS) ×2
ELECT REM PT RETURN 9FT ADLT (ELECTROSURGICAL) ×2
ELECTRODE BLDE 4.0 EZ CLN MEGD (MISCELLANEOUS) ×1 IMPLANT
ELECTRODE REM PT RTRN 9FT ADLT (ELECTROSURGICAL) ×1 IMPLANT
EVACUATOR 1/8 PVC DRAIN (DRAIN) IMPLANT
GLOVE BIO SURGEON STRL SZ8.5 (GLOVE) ×4 IMPLANT
GLOVE BIOGEL PI IND STRL 8.5 (GLOVE) ×1 IMPLANT
GLOVE BIOGEL PI INDICATOR 8.5 (GLOVE) ×1
GOWN STRL REUS W/ TWL LRG LVL3 (GOWN DISPOSABLE) ×2 IMPLANT
GOWN STRL REUS W/TWL 2XL LVL3 (GOWN DISPOSABLE) ×2 IMPLANT
GOWN STRL REUS W/TWL LRG LVL3 (GOWN DISPOSABLE) ×8
HANDPIECE INTERPULSE COAX TIP (DISPOSABLE) ×2
HOOD PEEL AWAY FACE SHEILD DIS (HOOD) ×4 IMPLANT
KIT BASIN OR (CUSTOM PROCEDURE TRAY) ×2 IMPLANT
KIT ROOM TURNOVER OR (KITS) ×2 IMPLANT
MANIFOLD NEPTUNE II (INSTRUMENTS) ×2 IMPLANT
MARKER SKIN DUAL TIP RULER LAB (MISCELLANEOUS) ×2 IMPLANT
NEEDLE SPNL 18GX3.5 QUINCKE PK (NEEDLE) ×2 IMPLANT
NS IRRIG 1000ML POUR BTL (IV SOLUTION) ×2 IMPLANT
PACK TOTAL JOINT (CUSTOM PROCEDURE TRAY) ×2 IMPLANT
PACK UNIVERSAL I (CUSTOM PROCEDURE TRAY) ×2 IMPLANT
PAD ARMBOARD 7.5X6 YLW CONV (MISCELLANEOUS) ×4 IMPLANT
SEALER BIPOLAR AQUA 6.0 (INSTRUMENTS) ×2 IMPLANT
SET HNDPC FAN SPRY TIP SCT (DISPOSABLE) ×1 IMPLANT
SUCTION FRAZIER HANDLE 10FR (MISCELLANEOUS) ×1
SUCTION TUBE FRAZIER 10FR DISP (MISCELLANEOUS) ×1 IMPLANT
SUT ETHIBOND NAB CT1 #1 30IN (SUTURE) ×4 IMPLANT
SUT MNCRL AB 3-0 PS2 18 (SUTURE) ×2 IMPLANT
SUT MON AB 2-0 CT1 36 (SUTURE) ×2 IMPLANT
SUT VIC AB 1 CT1 27 (SUTURE) ×2
SUT VIC AB 1 CT1 27XBRD ANBCTR (SUTURE) ×1 IMPLANT
SUT VIC AB 2-0 CT1 27 (SUTURE) ×2
SUT VIC AB 2-0 CT1 TAPERPNT 27 (SUTURE) ×1 IMPLANT
SUT VLOC 180 0 24IN GS25 (SUTURE) ×2 IMPLANT
SYR 50ML LL SCALE MARK (SYRINGE) ×2 IMPLANT
TOWEL OR 17X24 6PK STRL BLUE (TOWEL DISPOSABLE) ×2 IMPLANT
TOWEL OR 17X26 10 PK STRL BLUE (TOWEL DISPOSABLE) ×2 IMPLANT
TRAY FOLEY CATH SILVER 16FR (SET/KITS/TRAYS/PACK) IMPLANT
WATER STERILE IRR 1000ML POUR (IV SOLUTION) ×3 IMPLANT

## 2016-12-28 NOTE — Interval H&P Note (Signed)
History and Physical Interval Note:  12/28/2016 9:07 AM  Debbie Webb  has presented today for surgery, with the diagnosis of left femoral neck fracture  The various methods of treatment have been discussed with the patient and family. After consideration of risks, benefits and other options for treatment, the patient has consented to  Procedure(s): ANTERIOR APPROACH HEMI HIP ARTHROPLASTY (Left) as a surgical intervention .  The patient's history has been reviewed, patient examined, no change in status, stable for surgery.  I have reviewed the patient's chart and labs.  Questions were answered to the patient's satisfaction.    The risks, benefits, and alternatives were discussed with the patient. There are risks associated with the surgery including, but not limited to, problems with anesthesia (death), infection, instability (giving out of the joint), dislocation, differences in leg length/angulation/rotation, fracture of bones, loosening or failure of implants, hematoma (blood accumulation) which may require surgical drainage, blood clots, pulmonary embolism, nerve injury (foot drop and lateral thigh numbness), and blood vessel injury. The patient understands these risks and elects to proceed.   Patient ambulates with walker at home. Has severe L sided weakness due to previous CVA.   Harshitha Fretz, Cloyde Reams

## 2016-12-28 NOTE — Anesthesia Postprocedure Evaluation (Signed)
Anesthesia Post Note  Patient: Debbie Webb  Procedure(s) Performed: Procedure(s) (LRB): ANTERIOR APPROACH HEMI HIP ARTHROPLASTY (Left)     Patient location during evaluation: PACU Anesthesia Type: General Level of consciousness: awake and alert Pain management: pain level controlled Vital Signs Assessment: post-procedure vital signs reviewed and stable Respiratory status: spontaneous breathing, nonlabored ventilation and respiratory function stable Cardiovascular status: blood pressure returned to baseline and stable Postop Assessment: no signs of nausea or vomiting Anesthetic complications: no    Last Vitals:  Vitals:   12/28/16 1203 12/28/16 1216  BP:  (!) 134/39  Pulse: 75 61  Resp: (!) 21   Temp: 36.8 C (!) 36.3 C  SpO2: 96% 95%    Last Pain:  Vitals:   12/28/16 1216  TempSrc: Oral  PainSc:                  Lowella Curb

## 2016-12-28 NOTE — Transfer of Care (Signed)
Immediate Anesthesia Transfer of Care Note  Patient: Debbie Webb  Procedure(s) Performed: Procedure(s): ANTERIOR APPROACH HEMI HIP ARTHROPLASTY (Left)  Patient Location: PACU  Anesthesia Type:General  Level of Consciousness: awake, drowsy and patient cooperative  Airway & Oxygen Therapy: Patient Spontanous Breathing and Patient connected to nasal cannula oxygen  Post-op Assessment: Report given to RN and Post -op Vital signs reviewed and stable  Post vital signs: Reviewed and stable  Last Vitals:  Vitals:   12/28/16 0546 12/28/16 1118  BP: (!) 142/40   Pulse: 69 72  Resp: 15   Temp: 36.4 C 36.7 C  SpO2: 95%     Last Pain:  Vitals:   12/28/16 0546  TempSrc: Axillary  PainSc:          Complications: No apparent anesthesia complications

## 2016-12-28 NOTE — Anesthesia Preprocedure Evaluation (Signed)
Anesthesia Evaluation    Airway Mallampati: II  TM Distance: >3 FB Neck ROM: Full    Dental no notable dental hx.    Pulmonary    Pulmonary exam normal breath sounds clear to auscultation       Cardiovascular hypertension, Pt. on medications Normal cardiovascular exam Rhythm:Regular Rate:Normal     Neuro/Psych CVA    GI/Hepatic GERD  ,  Endo/Other    Renal/GU      Musculoskeletal   Abdominal   Peds  Hematology   Anesthesia Other Findings Femoral neck fracture Advanced age  Reproductive/Obstetrics                             Anesthesia Physical Anesthesia Plan  ASA: III  Anesthesia Plan: General   Post-op Pain Management:    Induction: Intravenous  PONV Risk Score and Plan: 3 and Ondansetron, Dexamethasone and Midazolam  Airway Management Planned: Oral ETT  Additional Equipment:   Intra-op Plan:   Post-operative Plan: Extubation in OR  Informed Consent: I have reviewed the patients History and Physical, chart, labs and discussed the procedure including the risks, benefits and alternatives for the proposed anesthesia with the patient or authorized representative who has indicated his/her understanding and acceptance.   Dental advisory given  Plan Discussed with: CRNA  Anesthesia Plan Comments:         Anesthesia Quick Evaluation

## 2016-12-28 NOTE — Progress Notes (Signed)
[name], female   DOB: [birth year], 83 y.o.   MRN: [id number]  PROGRESS NOTE    Debbie Webb  PIR:[id number] DOB: 08/13/1920 DOA: 12/27/2016 PCP: [name], MD   Brief Narrative:  83 year old female with history of hypertension, hyperlipidemia, stroke with left-sided weakness, depression with anxiety, tremor, carotid artery stenosis status post carotid artery endarterectomy presented with left shoulder and left hip pain of her fall. She was found to have acute left humeral neck fracture and left femoral neck fracture. She underwent surgical intervention by orthopedics today.  Assessment & Plan:   Principal Problem:   Fracture of femoral neck, left, closed (HCC) Active Problems:   Fall   Hypokalemia   Hypertension   Stroke (HCC)   HLD (hyperlipidemia)   Fx humeral neck, left, closed, initial encounter   Depression with anxiety   Left displaced femoral neck fracture (HCC)  Left femoral neck fracture - Status post surgical intervention by orthopedics today. Follow further recommendations. Physical therapy occupational therapy evaluation - Social work consult for probable placement - Incentive spirometry - Pain management  Left humeral neck fracture - Conservative management as per orthopedics  Mechanical fall - Fall precautions   Leukocytosis:  - Likely reactive; improving. Repeat a.m. labs. Monitor off antibiotics  Hypokalemia:  - Improved.  HTN: -Continue amlodipine; monitor blood pressure  Hx of stroke: with left sided hemiparesis -Continue Zocor -Resume Plavix once cleared by surgery  HLD: -zocor  Depression and anxiety: . -Continue Zoloft   DVT ppx: SCD Code Status: Full code Family Communication:   discussed with patient, son and grand children present at bedside Disposition Plan:   will probably need placement  Consultants: ortho  Procedures: Left hip hemiarthroplasty on 12/28/2016  Antimicrobials: None     Subjective: Patient seen and examined at bedside. She denies any overnight fever, nausea or vomiting.  Objective: Vitals:   12/28/16 1145 12/28/16 1200 12/28/16 1203 12/28/16 1216  BP: (!) 137/37 (!) 131/36  (!) 134/39  Pulse: 76 69 75 61  Resp: (!) 22 (!) 22 (!) 21   Temp:   98.3 F (36.8 C) (!) 97.4 F (36.3 C)  TempSrc:    Oral  SpO2: 95% 94% 96% 95%  Weight:      Height:        Intake/Output Summary (Last 24 hours) at 12/28/16 1443 Last data filed at 12/28/16 1041  Gross per 24 hour  Intake             1125 ml  Output             1150 ml  Net              -25 ml   Filed Weights   12/27/16 2305 12/28/16 0913  Weight: 56.2 kg (124 lb) 56.2 kg (124 lb)    Examination:  General exam: Appears calm and comfortable  Respiratory system: Bilateral decreased breath sound at bases Cardiovascular system: S1 & S2 heard, Rate controlled  Gastrointestinal system: Abdomen is nondistended, soft and nontender. Normal bowel sounds heard. Extremities: No cyanosis, clubbing, edema    Data Reviewed: I have personally reviewed following labs and imaging studies  CBC:  Recent Labs Lab 12/27/16 0301 12/28/16 0309 12/28/16 1302  WBC 20.2* 13.8* 18.3*  NEUTROABS 18.7* 11.7*  --   HGB 13.4 10.9* 9.7*  HCT 39.5 33.1* 29.8*  MCV 93.6 95.4 95.5  PLT 196 144* 122*   Basic Metabolic Panel:  Recent Labs Lab 12/27/16 0301  12/28/16 0309 12/28/16 1302  NA 139 139  --   K 3.4* 4.1  --   CL 104 107  --   CO2 27 27  --   GLUCOSE 123* 101*  --   BUN 27* 27*  --   CREATININE 0.99 0.98 1.09*  CALCIUM 10.1 9.0  --   MG  --  1.9  --    GFR: Estimated Creatinine Clearance: 28 mL/min (A) (by C-G formula based on SCr of 1.09 mg/dL (H)). Liver Function Tests: No results for input(s): AST, ALT, ALKPHOS, BILITOT, PROT, ALBUMIN in the last 168 hours. No results for input(s): LIPASE, AMYLASE in the last 168 hours. No results for input(s): AMMONIA in the last 168 hours. Coagulation  Profile:  Recent Labs Lab 12/27/16 0301  INR 1.02   Cardiac Enzymes: No results for input(s): CKTOTAL, CKMB, CKMBINDEX, TROPONINI in the last 168 hours. BNP (last 3 results) No results for input(s): PROBNP in the last 8760 hours. HbA1C: No results for input(s): HGBA1C in the last 72 hours. CBG: No results for input(s): GLUCAP in the last 168 hours. Lipid Profile: No results for input(s): CHOL, HDL, LDLCALC, TRIG, CHOLHDL, LDLDIRECT in the last 72 hours. Thyroid Function Tests: No results for input(s): TSH, T4TOTAL, FREET4, T3FREE, THYROIDAB in the last 72 hours. Anemia Panel: No results for input(s): VITAMINB12, FOLATE, FERRITIN, TIBC, IRON, RETICCTPCT in the last 72 hours. Sepsis Labs: No results for input(s): PROCALCITON, LATICACIDVEN in the last 168 hours.  Recent Results (from the past 240 hour(s))  Urine culture     Status: Abnormal   Collection Time: 12/27/16  2:55 AM  Result Value Ref Range Status   Specimen Description URINE, CLEAN CATCH  Final   Special Requests NONE  Final   Culture MULTIPLE SPECIES PRESENT, SUGGEST RECOLLECTION (A)  Final   Report Status 12/28/2016 FINAL  Final  Surgical pcr screen     Status: None   Collection Time: 12/28/16  2:31 AM  Result Value Ref Range Status   MRSA, PCR NEGATIVE NEGATIVE Final   Staphylococcus aureus NEGATIVE NEGATIVE Final    Comment:        The Xpert SA Assay (FDA approved for NASAL specimens in patients over 33 years of age), is one component of a comprehensive surveillance program.  Test performance has been validated by [facility] for patients greater than or equal to 16 year old. It is not intended to diagnose infection nor to guide or monitor treatment.          Radiology Studies: Ct Head Wo Contrast  Result Date: 12/27/2016 CLINICAL DATA:  Status post fall at home, with concern for head or cervical spine injury. Initial encounter. EXAM: CT HEAD WITHOUT CONTRAST CT CERVICAL SPINE WITHOUT CONTRAST  TECHNIQUE: Multidetector CT imaging of the head and cervical spine was performed following the standard protocol without intravenous contrast. Multiplanar CT image reconstructions of the cervical spine were also generated. COMPARISON:  CT of the head performed 12/28/2003 FINDINGS: CT HEAD FINDINGS Brain: No evidence of acute infarction, hemorrhage, hydrocephalus, extra-axial collection or mass lesion/mass effect. Prominence of the ventricles and sulci reflects mild cortical volume loss. Mild cerebellar atrophy is noted. Scattered periventricular white matter change likely reflects small vessel ischemic microangiopathy. The brainstem and fourth ventricle are within normal limits. The basal ganglia are unremarkable in appearance. The cerebral hemispheres demonstrate grossly normal gray-white differentiation. No mass effect or midline shift is seen. Vascular: No hyperdense vessel or unexpected calcification. Skull: There is no evidence  of fracture; visualized osseous structures are unremarkable in appearance. Sinuses/Orbits: The orbits are within normal limits. The paranasal sinuses and mastoid air cells are well-aerated. Other: No significant soft tissue abnormalities are seen. CT CERVICAL SPINE FINDINGS Alignment: Normal. Skull base and vertebrae: No acute fracture. No primary bone lesion or focal pathologic process. Soft tissues and spinal canal: No prevertebral fluid or swelling. No visible canal hematoma. Disc levels: Mild intervertebral disc space narrowing is noted along the lower cervical spine, with scattered anterior and posterior disc osteophyte complexes. Mild degenerative change is noted about the dens. Upper chest: The visualized lung apices are clear. The thyroid gland is somewhat heterogeneous but otherwise unremarkable. Scattered calcification is noted at the carotid bifurcations bilaterally. There is likely moderate to severe luminal narrowing at the proximal left internal carotid artery. Other: No  additional soft tissue abnormalities are seen. IMPRESSION: 1. No evidence of traumatic intracranial injury or fracture. 2. No evidence of fracture or subluxation along the cervical spine. 3. Mild cortical volume loss and scattered small vessel ischemic microangiopathy. 4. Mild degenerative change along the lower cervical spine. 5. Scattered calcification at the carotid bifurcations bilaterally. Likely moderate to severe luminal narrowing at the proximal left internal carotid artery. Carotid ultrasound would be helpful for further evaluation, when and as deemed clinically appropriate. Electronically Signed   By: Roanna Raider M.D.   On: 12/27/2016 02:32   Ct Cervical Spine Wo Contrast  Result Date: 12/27/2016 CLINICAL DATA:  Status post fall at home, with concern for head or cervical spine injury. Initial encounter. EXAM: CT HEAD WITHOUT CONTRAST CT CERVICAL SPINE WITHOUT CONTRAST TECHNIQUE: Multidetector CT imaging of the head and cervical spine was performed following the standard protocol without intravenous contrast. Multiplanar CT image reconstructions of the cervical spine were also generated. COMPARISON:  CT of the head performed 12/28/2003 FINDINGS: CT HEAD FINDINGS Brain: No evidence of acute infarction, hemorrhage, hydrocephalus, extra-axial collection or mass lesion/mass effect. Prominence of the ventricles and sulci reflects mild cortical volume loss. Mild cerebellar atrophy is noted. Scattered periventricular white matter change likely reflects small vessel ischemic microangiopathy. The brainstem and fourth ventricle are within normal limits. The basal ganglia are unremarkable in appearance. The cerebral hemispheres demonstrate grossly normal gray-white differentiation. No mass effect or midline shift is seen. Vascular: No hyperdense vessel or unexpected calcification. Skull: There is no evidence of fracture; visualized osseous structures are unremarkable in appearance. Sinuses/Orbits: The orbits  are within normal limits. The paranasal sinuses and mastoid air cells are well-aerated. Other: No significant soft tissue abnormalities are seen. CT CERVICAL SPINE FINDINGS Alignment: Normal. Skull base and vertebrae: No acute fracture. No primary bone lesion or focal pathologic process. Soft tissues and spinal canal: No prevertebral fluid or swelling. No visible canal hematoma. Disc levels: Mild intervertebral disc space narrowing is noted along the lower cervical spine, with scattered anterior and posterior disc osteophyte complexes. Mild degenerative change is noted about the dens. Upper chest: The visualized lung apices are clear. The thyroid gland is somewhat heterogeneous but otherwise unremarkable. Scattered calcification is noted at the carotid bifurcations bilaterally. There is likely moderate to severe luminal narrowing at the proximal left internal carotid artery. Other: No additional soft tissue abnormalities are seen. IMPRESSION: 1. No evidence of traumatic intracranial injury or fracture. 2. No evidence of fracture or subluxation along the cervical spine. 3. Mild cortical volume loss and scattered small vessel ischemic microangiopathy. 4. Mild degenerative change along the lower cervical spine. 5. Scattered calcification at the carotid bifurcations  bilaterally. Likely moderate to severe luminal narrowing at the proximal left internal carotid artery. Carotid ultrasound would be helpful for further evaluation, when and as deemed clinically appropriate. Electronically Signed   By: Roanna Raider M.D.   On: 12/27/2016 02:32   Pelvis Portable  Result Date: 12/28/2016 CLINICAL DATA:  LEFT hip replacement EXAM: PORTABLE PELVIS 1-2 VIEWS COMPARISON:  Portable exam 1140 hours compared intraoperative images of 12/28/2016 FINDINGS: LEFT hip prosthesis in expected position. Bones demineralized. No fracture or dislocation. IMPRESSION: LEFT hip prosthesis without acute complication. Electronically Signed   By:  Ulyses Southward M.D.   On: 12/28/2016 12:11   Dg Chest Port 1 View  Result Date: 12/27/2016 CLINICAL DATA:  Preoperative chest radiograph for hip and shoulder fractures. Initial encounter. EXAM: PORTABLE CHEST 1 VIEW COMPARISON:  Chest radiograph performed 02/07/2016 FINDINGS: The lungs are well-aerated. Left midlung scarring is noted. There is no evidence of pleural effusion or pneumothorax. The cardiomediastinal silhouette is is mildly enlarged. The masslike density overlying the right hilum reflects ingested material filling the previously noted esophageal diverticulum. The patient's known left humeral neck fracture is partially characterized. No acute displaced rib fractures are seen. Chronic left-sided rib deformities are noted. IMPRESSION: 1. Mild left midlung scarring noted. Lungs otherwise clear. No acute displaced rib fracture seen. 2. Mild cardiomegaly. 3. Ingested material seen filling the previously noted esophageal diverticulum, overlying the right hilum. 4. Known left humeral neck fracture is partially characterized. Electronically Signed   By: Roanna Raider M.D.   On: 12/27/2016 03:17   Dg Shoulder Left  Result Date: 12/27/2016 CLINICAL DATA:  Fall with pain on the left side EXAM: LEFT SHOULDER - 2+ VIEW COMPARISON:  01/04/2016 FINDINGS: Old distal left clavicle fracture.  Old left upper rib fractures. Acute left humeral neck fracture with less than 1/2 bone with of central displacement on axillary view. No dislocation. IMPRESSION: 1. Diffuse osteopenia. 2. Acute left humeral neck fracture. No dislocation of the humeral head 3. Old left clavicle and rib fractures Electronically Signed   By: Jasmine Pang M.D.   On: 12/27/2016 01:44   Dg C-arm 1-60 Min  Result Date: 12/28/2016 CLINICAL DATA:  Total left anterior hip replacement. Left hip fracture EXAM: DG C-ARM 61-120 MIN; OPERATIVE LEFT HIP WITH PELVIS COMPARISON:  12/27/2016 FINDINGS: C-arm images were obtained in the operating room. Left  hip replacement in satisfactory position and alignment. No acute complication IMPRESSION: Satisfactory left hip replacement Electronically Signed   By: Marlan Palau M.D.   On: 12/28/2016 11:37   Dg Hip Operative Unilat With Pelvis Left  Result Date: 12/28/2016 CLINICAL DATA:  Total left anterior hip replacement. Left hip fracture EXAM: DG C-ARM 61-120 MIN; OPERATIVE LEFT HIP WITH PELVIS COMPARISON:  12/27/2016 FINDINGS: C-arm images were obtained in the operating room. Left hip replacement in satisfactory position and alignment. No acute complication IMPRESSION: Satisfactory left hip replacement Electronically Signed   By: Marlan Palau M.D.   On: 12/28/2016 11:37   Dg Hip Unilat With Pelvis 2-3 Views Left  Result Date: 12/27/2016 CLINICAL DATA:  Status post fall on left side, with left hip pain. Initial encounter. EXAM: DG HIP (WITH OR WITHOUT PELVIS) 2-3V LEFT COMPARISON:  None. FINDINGS: There is a mildly displaced subcapital fracture through the left femoral neck. The left femoral head remains seated at the acetabulum. No additional fractures are seen. The right hip joint is unremarkable in appearance. Mild degenerative change is noted at the lower lumbar spine. The sacroiliac joints are grossly  unremarkable. The visualized bowel gas pattern is grossly unremarkable. Scattered vascular calcifications are seen. IMPRESSION: Mildly displaced subcapital fracture through the left femoral neck. Electronically Signed   By: Roanna Raider M.D.   On: 12/27/2016 01:49        Scheduled Meds: . amLODipine  5 mg Oral Daily  . docusate sodium  100 mg Oral BID  . [START ON 12/29/2016] enoxaparin (LOVENOX) injection  30 mg Subcutaneous Q24H  . [START ON 12/29/2016] feeding supplement (ENSURE ENLIVE)  237 mL Oral BID BM  . multivitamin with minerals  1 tablet Oral Daily  . senna  1 tablet Oral BID  . sertraline  50 mg Oral Daily  . simvastatin  40 mg Oral q1800  . vitamin B-12  100 mcg Oral Daily  .  vitamin C  500 mg Oral Daily   Continuous Infusions: . sodium chloride 75 mL/hr at 12/27/16 0454  .  ceFAZolin (ANCEF) IV    . lactated ringers       LOS: 1 day        Glade Lloyd, MD [facility] Pager [phone]  If 7PM-7AM, please contact night-coverage [email or web address] Password [facility] 12/28/2016, 2:43 PM

## 2016-12-28 NOTE — Progress Notes (Signed)
Report given to maria rn as caregiver 

## 2016-12-28 NOTE — H&P (View-Only) (Signed)
[name] MRN: [id number] DOB/AGE: 81/08/1920 81 y.o.  Admit date: 12/27/2016  Admission Diagnoses:  Principal Problem:   Fracture of femoral neck, left, closed (HCC) Active Problems:   Fall   Hypokalemia   Hypertension   Stroke (HCC)   HLD (hyperlipidemia)   Fx humeral neck, left, closed, initial encounter   Depression with anxiety   HPI: Pleasant 83 year old female pt with a past medical hx of a stroke with lift sided paralysis, HTN, hyperlipidemia, depression, Carotid artery stenosis.  The pt is anticoagulated on Plavix.  The pt unfortunately sustained a fall in her kitchen.  She reports pain of her left shoulder and left hip. The pt is accompanied by her brother and sister in law at the ED.    Past Medical History: Past Medical History:  Diagnosis Date  . Carotid artery disease (HCC)    Status post carotid endarterectomy x2  . Essential hypertension, benign   . Hemiparesis affecting left side as late effect of cerebrovascular accident (HCC) 08/03/2011  . Stroke (HCC) 1997  . Tremor     Surgical History: Past Surgical History:  Procedure Laterality Date  . CAROTID ENDARTERECTOMY     Right    Family History: Family History  Problem Relation Age of Onset  . Colon cancer Mother   . Heart disease Mother   . Bladder Cancer Brother   . Cirrhosis Brother        Liver cirrhosis    Social History: Social History   Social History  . Marital status: Widowed    Spouse name: N/A  . Number of children: N/A  . Years of education: N/A   Occupational History  . Not on file.   Social History Main Topics  . Smoking status: Never Smoker  . Smokeless tobacco: Never Used  . Alcohol use No  . Drug use: No  . Sexual activity: Not on file   Other Topics Concern  . Not on file   Social History Narrative  . No narrative on file    Allergies: Patient has no known allergies.  Medications: I have reviewed the patient's current  medications.  Vital Signs: Patient Vitals for the past 24 hrs:  BP Temp Temp src Pulse Resp SpO2  12/27/16 0600 (!) 139/51 - - 73 (!) 22 98 %  12/27/16 0545 (!) 148/58 - - 72 (!) 23 97 %  12/27/16 0530 (!) 145/71 - - 69 (!) 23 99 %  12/27/16 0515 (!) 144/57 - - 72 (!) 22 96 %  12/27/16 0500 (!) 137/52 - - 76 (!) 22 98 %  12/27/16 0445 (!) 138/57 - - 65 20 98 %  12/27/16 0430 (!) 133/46 - - 70 (!) 23 95 %  12/27/16 0415 (!) 144/54 - - 70 (!) 21 99 %  12/27/16 0400 (!) 121/50 - - 69 (!) 21 96 %  12/27/16 0345 (!) 115/45 - - 74 18 97 %  12/27/16 0330 (!) 153/62 - - 73 (!) 25 96 %  12/27/16 0230 (!) 134/58 - - 72 (!) 22 96 %  12/27/16 0215 (!) 172/56 - - (!) 39 (!) 24 97 %  12/27/16 0120 (!) 174/63 97.6 F (36.4 C) Oral 76 19 97 %  12/27/16 0111 - - - - - 90 %    Radiology: Ct Head Wo Contrast  Result Date: 12/27/2016 CLINICAL DATA:  Status post fall at home, with concern for head or cervical spine injury. Initial encounter. EXAM:  CT HEAD WITHOUT CONTRAST CT CERVICAL SPINE WITHOUT CONTRAST TECHNIQUE: Multidetector CT imaging of the head and cervical spine was performed following the standard protocol without intravenous contrast. Multiplanar CT image reconstructions of the cervical spine were also generated. COMPARISON:  CT of the head performed 12/28/2003 FINDINGS: CT HEAD FINDINGS Brain: No evidence of acute infarction, hemorrhage, hydrocephalus, extra-axial collection or mass lesion/mass effect. Prominence of the ventricles and sulci reflects mild cortical volume loss. Mild cerebellar atrophy is noted. Scattered periventricular white matter change likely reflects small vessel ischemic microangiopathy. The brainstem and fourth ventricle are within normal limits. The basal ganglia are unremarkable in appearance. The cerebral hemispheres demonstrate grossly normal gray-white differentiation. No mass effect or midline shift is seen. Vascular: No hyperdense vessel or unexpected calcification.  Skull: There is no evidence of fracture; visualized osseous structures are unremarkable in appearance. Sinuses/Orbits: The orbits are within normal limits. The paranasal sinuses and mastoid air cells are well-aerated. Other: No significant soft tissue abnormalities are seen. CT CERVICAL SPINE FINDINGS Alignment: Normal. Skull base and vertebrae: No acute fracture. No primary bone lesion or focal pathologic process. Soft tissues and spinal canal: No prevertebral fluid or swelling. No visible canal hematoma. Disc levels: Mild intervertebral disc space narrowing is noted along the lower cervical spine, with scattered anterior and posterior disc osteophyte complexes. Mild degenerative change is noted about the dens. Upper chest: The visualized lung apices are clear. The thyroid gland is somewhat heterogeneous but otherwise unremarkable. Scattered calcification is noted at the carotid bifurcations bilaterally. There is likely moderate to severe luminal narrowing at the proximal left internal carotid artery. Other: No additional soft tissue abnormalities are seen. IMPRESSION: 1. No evidence of traumatic intracranial injury or fracture. 2. No evidence of fracture or subluxation along the cervical spine. 3. Mild cortical volume loss and scattered small vessel ischemic microangiopathy. 4. Mild degenerative change along the lower cervical spine. 5. Scattered calcification at the carotid bifurcations bilaterally. Likely moderate to severe luminal narrowing at the proximal left internal carotid artery. Carotid ultrasound would be helpful for further evaluation, when and as deemed clinically appropriate. Electronically Signed   By: Roanna Raider M.D.   On: 12/27/2016 02:32   Ct Cervical Spine Wo Contrast  Result Date: 12/27/2016 CLINICAL DATA:  Status post fall at home, with concern for head or cervical spine injury. Initial encounter. EXAM: CT HEAD WITHOUT CONTRAST CT CERVICAL SPINE WITHOUT CONTRAST TECHNIQUE:  Multidetector CT imaging of the head and cervical spine was performed following the standard protocol without intravenous contrast. Multiplanar CT image reconstructions of the cervical spine were also generated. COMPARISON:  CT of the head performed 12/28/2003 FINDINGS: CT HEAD FINDINGS Brain: No evidence of acute infarction, hemorrhage, hydrocephalus, extra-axial collection or mass lesion/mass effect. Prominence of the ventricles and sulci reflects mild cortical volume loss. Mild cerebellar atrophy is noted. Scattered periventricular white matter change likely reflects small vessel ischemic microangiopathy. The brainstem and fourth ventricle are within normal limits. The basal ganglia are unremarkable in appearance. The cerebral hemispheres demonstrate grossly normal gray-white differentiation. No mass effect or midline shift is seen. Vascular: No hyperdense vessel or unexpected calcification. Skull: There is no evidence of fracture; visualized osseous structures are unremarkable in appearance. Sinuses/Orbits: The orbits are within normal limits. The paranasal sinuses and mastoid air cells are well-aerated. Other: No significant soft tissue abnormalities are seen. CT CERVICAL SPINE FINDINGS Alignment: Normal. Skull base and vertebrae: No acute fracture. No primary bone lesion or focal pathologic process. Soft tissues and spinal canal:  No prevertebral fluid or swelling. No visible canal hematoma. Disc levels: Mild intervertebral disc space narrowing is noted along the lower cervical spine, with scattered anterior and posterior disc osteophyte complexes. Mild degenerative change is noted about the dens. Upper chest: The visualized lung apices are clear. The thyroid gland is somewhat heterogeneous but otherwise unremarkable. Scattered calcification is noted at the carotid bifurcations bilaterally. There is likely moderate to severe luminal narrowing at the proximal left internal carotid artery. Other: No additional  soft tissue abnormalities are seen. IMPRESSION: 1. No evidence of traumatic intracranial injury or fracture. 2. No evidence of fracture or subluxation along the cervical spine. 3. Mild cortical volume loss and scattered small vessel ischemic microangiopathy. 4. Mild degenerative change along the lower cervical spine. 5. Scattered calcification at the carotid bifurcations bilaterally. Likely moderate to severe luminal narrowing at the proximal left internal carotid artery. Carotid ultrasound would be helpful for further evaluation, when and as deemed clinically appropriate. Electronically Signed   By: Roanna Raider M.D.   On: 12/27/2016 02:32   Dg Chest Port 1 View  Result Date: 12/27/2016 CLINICAL DATA:  Preoperative chest radiograph for hip and shoulder fractures. Initial encounter. EXAM: PORTABLE CHEST 1 VIEW COMPARISON:  Chest radiograph performed 02/07/2016 FINDINGS: The lungs are well-aerated. Left midlung scarring is noted. There is no evidence of pleural effusion or pneumothorax. The cardiomediastinal silhouette is is mildly enlarged. The masslike density overlying the right hilum reflects ingested material filling the previously noted esophageal diverticulum. The patient's known left humeral neck fracture is partially characterized. No acute displaced rib fractures are seen. Chronic left-sided rib deformities are noted. IMPRESSION: 1. Mild left midlung scarring noted. Lungs otherwise clear. No acute displaced rib fracture seen. 2. Mild cardiomegaly. 3. Ingested material seen filling the previously noted esophageal diverticulum, overlying the right hilum. 4. Known left humeral neck fracture is partially characterized. Electronically Signed   By: Roanna Raider M.D.   On: 12/27/2016 03:17   Dg Shoulder Left  Result Date: 12/27/2016 CLINICAL DATA:  Fall with pain on the left side EXAM: LEFT SHOULDER - 2+ VIEW COMPARISON:  01/04/2016 FINDINGS: Old distal left clavicle fracture.  Old left upper rib  fractures. Acute left humeral neck fracture with less than 1/2 bone with of central displacement on axillary view. No dislocation. IMPRESSION: 1. Diffuse osteopenia. 2. Acute left humeral neck fracture. No dislocation of the humeral head 3. Old left clavicle and rib fractures Electronically Signed   By: Jasmine Pang M.D.   On: 12/27/2016 01:44   Dg Hip Unilat With Pelvis 2-3 Views Left  Result Date: 12/27/2016 CLINICAL DATA:  Status post fall on left side, with left hip pain. Initial encounter. EXAM: DG HIP (WITH OR WITHOUT PELVIS) 2-3V LEFT COMPARISON:  None. FINDINGS: There is a mildly displaced subcapital fracture through the left femoral neck. The left femoral head remains seated at the acetabulum. No additional fractures are seen. The right hip joint is unremarkable in appearance. Mild degenerative change is noted at the lower lumbar spine. The sacroiliac joints are grossly unremarkable. The visualized bowel gas pattern is grossly unremarkable. Scattered vascular calcifications are seen. IMPRESSION: Mildly displaced subcapital fracture through the left femoral neck. Electronically Signed   By: Roanna Raider M.D.   On: 12/27/2016 01:49    Labs:  Recent Labs  12/27/16 0301  WBC 20.2*  RBC 4.22  HCT 39.5  PLT 196    Recent Labs  12/27/16 0301  NA 139  K 3.4*  CL 104  CO2 27  BUN 27*  CREATININE 0.99  GLUCOSE 123*  CALCIUM 10.1    Recent Labs  12/27/16 0301  INR 1.02    Review of Systems: ROS  Physical Exam: Neurologically intact ABD soft Sensation intact distally Compartment soft  Decreased ROM and TTP of the proximal left upper extremity TTP of the Left hip Laterally rotated LLE     Assessment and Plan: NWB on the left lower extremity Admit to floor Pending surgical intervention Dr. Shon Baton has consulted Dr Linna Caprice who will do surgery    [facility] for Venita Lick, MD [facility] [phone]  Agree with above Await medical  clearance Dr Linna Caprice aware

## 2016-12-28 NOTE — Op Note (Signed)
OPERATIVE REPORT  SURGEON: Samson Frederic, MD   ASSISTANT: April Green, RNFA.  PREOPERATIVE DIAGNOSIS: Displaced Left femoral neck fracture.   POSTOPERATIVE DIAGNOSIS: Displaced Left femoral neck fracture.   PROCEDURE: Left hip hemiarthroplasty, anterior approach.   IMPLANTS: DePuy Tri Lock stem, size 7, std offset, with a -3 mm spacer and a 44 mm monopolar head ball.  ANESTHESIA:  General  ANTIBIOTICS: 2g ancef.  ESTIMATED BLOOD LOSS: 200 mL.  DRAINS: None.  COMPLICATIONS: None   CONDITION: PACU - hemodynamically stable.   BRIEF CLINICAL NOTE: Debbie Webb is a 83 y.o. female with a displaced Left femoral neck fracture. The patient was admitted to the hospitalist service and underwent perioperative risk stratification and medical optimization. The risks, benefits, and alternatives to hemiarthroplasty were explained, and the patient elected to proceed.  PROCEDURE IN DETAIL: The patient was taken to the operating room and general anesthesia was induced on the hospital bed. The patient was then positioned on the Hana table. All bony prominences were well padded. The hip was prepped and draped in the normal sterile surgical fashion. A time-out was called verifying side and site of surgery. Antibiotics were given within 60 minutes of beginning the procedure.  The direct anterior approach to the hip was performed through the Hueter interval. Lateral femoral circumflex vessels were treated with the Auqumantys. The anterior capsule was exposed and an inverted T capsulotomy was made. Fracture hematoma was encountered and evacuated. The patient was found to have a comminuted Left subcapital femoral neck fracture. I freshened the femoral neck cut with a saw. I removed the femoral neck fragment. A corkscrew was placed into the head and the head was removed. This was passed to the back table and was measured.  Acetabular exposure was achieved. I examined the articular  cartilage which was intact. The labrum was intact. A 44 mm trial head was placed and found to have excellent fit.  I then gained femoral exposure taking care to protect the abductors and greater trochanter. This was performed using standard external rotation, extension, and adduction. The capsule was peeled off the inner aspect of the greater trochanter, taking care to preserve the short external rotators. A cookie cutter was used to enter the femoral canal, and then the femoral canal finder was used to confirm location. I then sequentially broached up to a size 7. Calcar planer was used on the femoral neck remnant. I paced a std neck and a 36+ 1.5 head ball.The hip was reduced. Leg lengths were checked fluoroscopically. The hip was dislocated and trial components were removed. I placed the real stem followed by the real spacer and head ball. A single reduction maneuver was performed and the hip was reduced. Fluoroscopy was used to confirm component position and leg lengths. At 90 degrees of external rotation and extension, the hip was stable to an anterior directed force.  The wound was copiously irrigated with normal saline solution. Marcaine solution was injected into the periarticular soft tissue. The wound was closed in layers using #1 Vicryl and V-Loc for the fascia, 2-0 Vicryl for the subcutaneous fat, 2-0 Monocryl for the deep dermal layer, 3-0 running Monocryl subcuticular stitch and glue for the skin. Once the glue was fully dried, an Aquacell Ag dressing was applied. The patient was then awakened from anesthesia and transported to the recovery room in stable condition. Sponge, needle, and instrument counts were correct at the end of the case x2. The patient tolerated the procedure well and there were no  known complications.

## 2016-12-28 NOTE — Anesthesia Procedure Notes (Signed)
Procedure Name: Intubation Date/Time: 12/28/2016 9:38 AM Performed by: Myna Bright Pre-anesthesia Checklist: Patient identified, Emergency Drugs available, Suction available and Patient being monitored Patient Re-evaluated:Patient Re-evaluated prior to induction Oxygen Delivery Method: Circle system utilized Preoxygenation: Pre-oxygenation with 100% oxygen Induction Type: IV induction Ventilation: Mask ventilation without difficulty Laryngoscope Size: Mac and 3 Grade View: Grade II Tube type: Oral Tube size: 7.0 mm Number of attempts: 1 Airway Equipment and Method: Stylet Placement Confirmation: ETT inserted through vocal cords under direct vision,  positive ETCO2 and breath sounds checked- equal and bilateral Secured at: 21 cm Tube secured with: Tape Dental Injury: Teeth and Oropharynx as per pre-operative assessment

## 2016-12-28 NOTE — Progress Notes (Signed)
Initial Nutrition Assessment  DOCUMENTATION CODES:   Not applicable  INTERVENTION:  Once diet advances, provide Ensure Enlive po BID, each supplement provides 350 kcal and 20 grams of protein.  NUTRITION DIAGNOSIS:   Increased nutrient needs related to  (post op healing) as evidenced by estimated needs.  GOAL:   Patient will meet greater than or equal to 90% of their needs  MONITOR:   PO intake, Supplement acceptance, Labs, Weight trends, Skin, I & O's  REASON FOR ASSESSMENT:   Consult Assessment of nutrition requirement/status  ASSESSMENT:   83 y.o. female with medical history significant of hypertension, hyperlipidemia, stroke with left sided paralysis, depression with anxiety, tremor, carotid artery stenosis (s/p of carotid artery endarterectomy), who presents with pain in the left shoulder and the left hip after fall. X-ray of left pelvis showed mildly displaced subcapital fracture through the left femoral neck.  Procedure (8/17): ANTERIOR APPROACH HEMI HIP ARTHROPLASTY (Left)  Pt was unavailable, in OR, during time of visit. Per RN, pt with difficulties swallowing. Recommend SLP consult. Weight has been stable per weight records. RD to order nutritional supplements to aid in caloric and protein needs as well as post op healing. Unable to complete Nutrition-Focused physical exam at this time. RD to perform physical exam at next visit.   Labs and medications reviewed.   Diet Order:  Diet clear liquid Room service appropriate? Yes; Fluid consistency: Thin  Skin:   (Incision on L hip)  Last BM:  Unknown  Height:   Ht Readings from Last 1 Encounters:  12/28/16 5\' 5"  (1.651 m)    Weight:   Wt Readings from Last 1 Encounters:  12/28/16 124 lb (56.2 kg)    Ideal Body Weight:  56.8 kg  BMI:  Body mass index is 20.63 kg/m.  Estimated Nutritional Needs:   Kcal:  1400-1600  Protein:  55-65 grams  Fluid:  >/= 1.5 L/day  EDUCATION NEEDS:   No education  needs identified at this time  Roslyn Smiling, MS, RD, LDN Pager # [phone] After hours/ weekend pager # [phone]

## 2016-12-28 NOTE — Discharge Instructions (Signed)
°Dr. Tamella Tuccillo °Joint Replacement Specialist °[facility] °[address] °[place],  [address] °[phone] ° ° °TOTAL HIP REPLACEMENT POSTOPERATIVE DIRECTIONS ° ° ° °Hip Rehabilitation, Guidelines Following Surgery  ° °WEIGHT BEARING °Weight bearing as tolerated with assist device (walker, cane, etc) as directed, use it as long as suggested by your surgeon or therapist, typically at least 4-6 weeks. ° °The results of a hip operation are greatly improved after range of motion and muscle strengthening exercises. Follow all safety measures which are given to protect your hip. If any of these exercises cause increased pain or swelling in your joint, decrease the amount until you are comfortable again. Then slowly increase the exercises. Call your caregiver if you have problems or questions.  ° °HOME CARE INSTRUCTIONS  °Most of the following instructions are designed to prevent the dislocation of your new hip.  °Remove items at home which could result in a fall. This includes throw rugs or furniture in walking pathways.  °Continue medications as instructed at time of discharge. °· You may have some home medications which will be placed on hold until you complete the course of blood thinner medication. °· You may start showering once you are discharged home. Do not remove your dressing. °Do not put on socks or shoes without following the instructions of your caregivers.   °Sit on chairs with arms. Use the chair arms to help push yourself up when arising.  °Arrange for the use of a toilet seat elevator so you are not sitting low.  °· Walk with walker as instructed.  °You may resume a sexual relationship in one month or when given the OK by your caregiver.  °Use walker as long as suggested by your caregivers.  °You may put full weight on your legs and walk as much as is comfortable. °Avoid periods of inactivity such as sitting longer than an hour when not asleep. This helps prevent  blood clots.  °You may return to work once you are cleared by your surgeon.  °Do not drive a car for 6 weeks or until released by your surgeon.  °Do not drive while taking narcotics.  °Wear elastic stockings for two weeks following surgery during the day but you may remove then at night.  °Make sure you keep all of your appointments after your operation with all of your doctors and caregivers. You should call the office at the above phone number and make an appointment for approximately two weeks after the date of your surgery. °Please pick up a stool softener and laxative for home use as long as you are requiring pain medications. °· ICE to the affected hip every three hours for 30 minutes at a time and then as needed for pain and swelling. Continue to use ice on the hip for pain and swelling from surgery. You may notice swelling that will progress down to the foot and ankle.  This is normal after surgery.  Elevate the leg when you are not up walking on it.   °It is important for you to complete the blood thinner medication as prescribed by your doctor. °· Continue to use the breathing machine which will help keep your temperature down.  It is common for your temperature to cycle up and down following surgery, especially at night when you are not up moving around and exerting yourself.  The breathing machine keeps your lungs expanded and your temperature down. ° °RANGE OF MOTION AND STRENGTHENING EXERCISES  °These exercises are   designed to help you keep full movement of your hip joint. Follow your caregiver's or physical therapist's instructions. Perform all exercises about fifteen times, three times per day or as directed. Exercise both hips, even if you have had only one joint replacement. These exercises can be done on a training (exercise) mat, on the floor, on a table or on a bed. Use whatever works the best and is most comfortable for you. Use music or television while you are exercising so that the exercises  are a pleasant break in your day. This will make your life better with the exercises acting as a break in routine you can look forward to.  Lying on your back, slowly slide your foot toward your buttocks, raising your knee up off the floor. Then slowly slide your foot back down until your leg is straight again.  Lying on your back spread your legs as far apart as you can without causing discomfort.  Lying on your side, raise your upper leg and foot straight up from the floor as far as is comfortable. Slowly lower the leg and repeat.  Lying on your back, tighten up the muscle in the front of your thigh (quadriceps muscles). You can do this by keeping your leg straight and trying to raise your heel off the floor. This helps strengthen the largest muscle supporting your knee.  Lying on your back, tighten up the muscles of your buttocks both with the legs straight and with the knee bent at a comfortable angle while keeping your heel on the floor.   SKILLED REHAB INSTRUCTIONS: If the patient is transferred to a skilled rehab facility following release from the hospital, a list of the current medications will be sent to the facility for the patient to continue.  When discharged from the skilled rehab facility, please have the facility set up the patient's Home Health Physical Therapy prior to being released. Also, the skilled facility will be responsible for providing the patient with their medications at time of release from the facility to include their pain medication and their blood thinner medication. If the patient is still at the rehab facility at time of the two week follow up appointment, the skilled rehab facility will also need to assist the patient in arranging follow up appointment in our office and any transportation needs.  MAKE SURE YOU:  Understand these instructions.  Will watch your condition.  Will get help right away if you are not doing well or get worse.  Pick up stool softner and  laxative for home use following surgery while on pain medications. Do not remove your dressing. The dressing is waterproof--it is OK to take showers. Continue to use ice for pain and swelling after surgery. Do not use any lotions or creams on the incision until instructed by your surgeon. Total Hip Protocol. Non weight bearing left arm - Sling at all times

## 2016-12-28 NOTE — Progress Notes (Signed)
Orthopedic Tech Progress Note Patient Details:  Debbie Webb 1920-08-09 [id number]  Ortho Devices Type of Ortho Device: Shoulder immobilizer Ortho Device/Splint Interventions: Application   Saul Fordyce 12/28/2016, 5:22 PM

## 2016-12-29 DIAGNOSIS — F418 Other specified anxiety disorders: Secondary | ICD-10-CM

## 2016-12-29 LAB — CBC
HCT: 27.9 % — ABNORMAL LOW (ref 36.0–46.0)
Hemoglobin: 9.1 g/dL — ABNORMAL LOW (ref 12.0–15.0)
MCH: 30.8 pg (ref 26.0–34.0)
MCHC: 32.6 g/dL (ref 30.0–36.0)
MCV: 94.6 fL (ref 78.0–100.0)
Platelets: 120 K/uL — ABNORMAL LOW (ref 150–400)
RBC: 2.95 MIL/uL — ABNORMAL LOW (ref 3.87–5.11)
RDW: 13.2 % (ref 11.5–15.5)
WBC: 14.5 K/uL — ABNORMAL HIGH (ref 4.0–10.5)

## 2016-12-29 LAB — BASIC METABOLIC PANEL
ANION GAP: 8 (ref 5–15)
BUN: 23 mg/dL — ABNORMAL HIGH (ref 6–20)
CHLORIDE: 104 mmol/L (ref 101–111)
CO2: 25 mmol/L (ref 22–32)
CREATININE: 0.99 mg/dL (ref 0.44–1.00)
Calcium: 8.7 mg/dL — ABNORMAL LOW (ref 8.9–10.3)
GFR calc non Af Amer: 47 mL/min — ABNORMAL LOW (ref >60)
GFR, EST AFRICAN AMERICAN: 55 mL/min — AB (ref >60)
Glucose, Bld: 105 mg/dL — ABNORMAL HIGH (ref 65–99)
Potassium: 3.8 mmol/L (ref 3.5–5.1)
Sodium: 137 mmol/L (ref 135–145)

## 2016-12-29 LAB — MAGNESIUM: Magnesium: 1.7 mg/dL (ref 1.7–2.4)

## 2016-12-29 NOTE — Progress Notes (Addendum)
Subjective: 1 Day Post-Op Procedure(s) (LRB): ANTERIOR APPROACH HEMI HIP ARTHROPLASTY (Left) Patient reports pain as 2 on 0-10 scale.   The patient reports her pain as mild. She is sitting comfortably in bed.  She is tolerating her diet with no difficulty.  She has not been seen by PT yet. She denies calf pain, chest pain, shortness of breath, nausea, vomiting, diarrhea, fever, or chills.   Objective: Vital signs in last 24 hours: Temp:  [97 F (36.1 C)-99.7 F (37.6 C)] 97.9 F (36.6 C) (08/18 0500) Pulse Rate:  [61-88] 75 (08/18 0910) Resp:  [15-22] 18 (08/18 0500) BP: (122-154)/(29-46) 130/46 (08/18 0910) SpO2:  [90 %-100 %] 100 % (08/18 0910)  Intake/Output from previous day: 08/17 0701 - 08/18 0700 In: 600 [I.V.:600] Out: 1200 [Urine:1000; Blood:200] Intake/Output this shift: No intake/output data recorded.   Recent Labs  12/27/16 0301 12/28/16 0309 12/28/16 1302 12/29/16 0323  HGB 13.4 10.9* 9.7* 9.1*    Recent Labs  12/28/16 1302 12/29/16 0323  WBC 18.3* 14.5*  RBC 3.12* 2.95*  HCT 29.8* 27.9*  PLT 122* 120*    Recent Labs  12/28/16 0309 12/28/16 1302 12/29/16 0323  NA 139  --  137  K 4.1  --  3.8  CL 107  --  104  CO2 27  --  25  BUN 27*  --  23*  CREATININE 0.98 1.09* 0.99  GLUCOSE 101*  --  105*  CALCIUM 9.0  --  8.7*    Recent Labs  12/27/16 0301  INR 1.02   Alert and oriented x 3. Dressing is clean, dry, and intact. Sensation intact distally Intact pulses distally Dorsiflexion/Plantar flexion intact  Assessment/Plan: 1 Day Post-Op Procedure(s) (LRB): ANTERIOR APPROACH HEMI HIP ARTHROPLASTY (Left) Up with therapy - WBAT to the left leg Continue with pulmonary toilert Non-weightbearing with the left arm. Continue with sling for the left upper extremity. Continue with pain management as needed. Plan for d/c to SNF when cleared by hospitalists.  Karma Greaser 12/29/2016, 9:46 AM

## 2016-12-29 NOTE — Plan of Care (Signed)
Problem: Skin Integrity: Goal: Risk for impaired skin integrity will decrease Patient's skin is clean, dry, and intact. Repositions frequently

## 2016-12-29 NOTE — Evaluation (Signed)
Physical Therapy Evaluation Patient Details Name: Debbie Webb MRN: [id number] DOB: March 04, 1921 Today's Date: 12/29/2016   History of Present Illness  Pt is a 83 y.o. female with PMH signficant for CVA with L sided weakness, HTN, hyperlipidemia, depression, and carotid artery stenosis. She presented to the ED following fall in kitchen. Found to have L humeral neck fracture and displaced L femoral neck fracture now s/p L hip hemiarthroplasty (anterior approach).  Clinical Impression  Patient is s/p above procedure, presenting with functional limitations due to the deficits listed below (see PT Problem List). Requires mod-max assist +2 for basic mobility including transfers and bed mobility. She has Hx of Lt sided weakness from previous CVA. She lives alone. Patient will benefit from skilled PT to increase their independence and safety with mobility to allow discharge to the venue listed below.       Follow Up Recommendations SNF (Son requesting [facility])    Equipment Recommendations  None recommended by PT    Recommendations for Other Services       Precautions / Restrictions Precautions Precautions: Fall Restrictions Weight Bearing Restrictions: Yes LUE Weight Bearing: Non weight bearing LLE Weight Bearing: Weight bearing as tolerated      Mobility  Bed Mobility Overal bed mobility: Needs Assistance Bed Mobility: Sit to Sidelying;Rolling Rolling: Mod assist   Supine to sit: +2 for safety/equipment;Mod assist   Sit to sidelying: Mod assist;+2 for physical assistance General bed mobility comments: Able to perform posterior scoot 50% without assist. Mod assist to roll and Mod assist +2 for LE support with sit to sidelying transition.  Transfers Overall transfer level: Needs assistance Equipment used: 1 person hand held assist Transfers: Sit to/from [employer] Sit to Stand: Mod assist Stand pivot transfers: Max assist       General transfer comment: Mod  assist for boost to stand from recliner, minimal WB through LLE, good RUE strength for boost. VC and tactile cues frequently for anterior weight shift due to posterior lean. Cues for sequencing with eventual ability to WB LLE and lateral step with pivots. Pre-gait task with weight shifting activity.  Ambulation/Gait                Stairs            Wheelchair Mobility    Modified Rankin (Stroke Patients Only)       Balance Overall balance assessment: Needs assistance Sitting-balance support: Feet supported;Single extremity supported Sitting balance-Leahy Scale: Poor     Standing balance support: Single extremity supported;During functional activity Standing balance-Leahy Scale: Poor Standing balance comment: Posterior lean                             Pertinent Vitals/Pain Pain Assessment: Faces Faces Pain Scale: Hurts little more Pain Location: L hip Pain Descriptors / Indicators: Operative site guarding;Aching;Sore Pain Intervention(s): Limited activity within patient's tolerance;Monitored during session;Repositioned;Ice applied    Home Living Family/patient expects to be discharged to:: Private residence Living Arrangements:  (sister-in-law assists from 9-2) Available Help at Discharge: Family;Available 24 hours/day Type of Home: House Home Access: Stairs to enter   [employer] of Steps: 6 Home Layout: Two level (basement - does not use the basement) Home Equipment: Bedside commode;Shower seat;Walker - 4 wheels      Prior Function Level of Independence: Needs assistance   Gait / Transfers Assistance Needed: Ambulates with RW. Per son "leaves" it around frequently but will hold on  to furniture.   ADL's / Homemaking Assistance Needed: Pt reporting that she bathes and dresses herself. However, son reports that pt only does bird baths and has assistance for ADL.   Comments: Son reports pt has had approx 8 falls in the past 6 months to  1 year.     Hand Dominance   Dominant Hand: Right    Extremity/Trunk Assessment   Upper Extremity Assessment Upper Extremity Assessment: Defer to OT evaluation LUE Deficits / Details: Immobilized in sling. Able to move digits and wrist. Large bruise on lateral aspect of shoulder.     Lower Extremity Assessment Lower Extremity Assessment: LLE deficits/detail LLE Deficits / Details: Hx of weakness from prior CVA. Son reports pt unable to advance LLE forward at baseline. Pt was able to abduct LLE in standing with small anterior step. LLE: Unable to fully assess due to pain       Communication   Communication: HOH  Cognition Arousal/Alertness: Awake/alert Behavior During Therapy: WFL for tasks assessed/performed Overall Cognitive Status: Impaired/Different from baseline Area of Impairment: Orientation;Attention;Problem solving                 Orientation Level: Disoriented to;Place;Situation Current Attention Level: Selective         Problem Solving: Slow processing;Requires verbal cues General Comments: Pt able to converse well but was confused concerning situation.       General Comments General comments (skin integrity, edema, etc.): SpO2 89% on room air while working with PT. 96% on 2L supplemental O2 at rest. Son present and supportive.    Exercises Total Joint Exercises Ankle Circles/Pumps: Both;20 reps;Seated Heel Slides: Strengthening;Left;10 reps;AAROM Long Arc Quad: Left;10 reps;Seated   Assessment/Plan    PT Assessment Patient needs continued PT services  PT Problem List Decreased strength;Decreased activity tolerance;Decreased range of motion;Decreased balance;Decreased mobility;Decreased knowledge of use of DME;Decreased knowledge of precautions;Decreased cognition;Pain       PT Treatment Interventions DME instruction;Gait training;Functional mobility training;Therapeutic activities;Therapeutic exercise;Balance training;Neuromuscular  re-education;Patient/family education    PT Goals (Current goals can be found in the Care Plan section)  Acute Rehab PT Goals Patient Stated Goal: to walk PT Goal Formulation: With patient/family Time For Goal Achievement: 01/12/17 Potential to Achieve Goals: Good    Frequency Min 3X/week   Barriers to discharge Decreased caregiver support lives alone    Co-evaluation               AM-PAC PT "6 Clicks" Daily Activity  Outcome Measure Difficulty turning over in bed (including adjusting bedclothes, sheets and blankets)?: A Lot Difficulty moving from lying on back to sitting on the side of the bed? : A Lot Difficulty sitting down on and standing up from a chair with arms (e.g., wheelchair, bedside commode, etc,.)?: A Lot Help needed moving to and from a bed to chair (including a wheelchair)?: A Lot Help needed walking in hospital room?: Total Help needed climbing 3-5 steps with a railing? : Total 6 Click Score: 10    End of Session Equipment Utilized During Treatment: Gait belt Activity Tolerance: Patient tolerated treatment well Patient left: in bed;with call bell/phone within reach;with family/visitor present;with SCD's reapplied Nurse Communication: Mobility status;Weight bearing status PT Visit Diagnosis: Muscle weakness (generalized) (M62.81);History of falling (Z91.81);Repeated falls (R29.6);Difficulty in walking, not elsewhere classified (R26.2);Hemiplegia and hemiparesis;Pain Hemiplegia - Right/Left: Left Hemiplegia - dominant/non-dominant: Non-dominant Hemiplegia - caused by: Cerebral infarction (20 years ago) Pain - Right/Left: Left Pain - part of body: Shoulder;Hip    Time: 8119-1478 PT  Time Calculation (min) (ACUTE ONLY): 34 min   Charges:   PT Evaluation $PT Eval Moderate Complexity: 1 Mod PT Treatments $Therapeutic Activity: 8-22 mins   PT G Codes:        Charlsie Merles, Daniel, DPT [phone]   Berton Mount 12/29/2016, 2:33 PM

## 2016-12-29 NOTE — Progress Notes (Signed)
[name], female   DOB: 12-23-1920, 83 y.o.   MRN: [id number]  PROGRESS NOTE    Debbie Webb  WUJ:[id number] DOB: December 19, 1920 DOA: 12/27/2016 PCP: [name], MD   Brief Narrative:  83 year old female with history of hypertension, hyperlipidemia, stroke with left-sided weakness, depression with anxiety, tremor, carotid artery stenosis status post carotid artery endarterectomy presented with left shoulder and left hip pain of her fall. She was found to have acute left humeral neck fracture and left femoral neck fracture. She underwent left hip hemiarthroplasty on 12/28/2016.   Assessment & Plan:   Principal Problem:   Fracture of femoral neck, left, closed (HCC) Active Problems:   Fall   Hypokalemia   Hypertension   Stroke (HCC)   HLD (hyperlipidemia)   Fx humeral neck, left, closed, initial encounter   Depression with anxiety   Left displaced femoral neck fracture (HCC)   Left femoral neck fracture - Status post left hip hemiarthroplasty on 12/28/2016. Wound care as per orthopedics recommendations. Weightbearing as per orthopedics/physical therapy recommendations - Physical therapy occupational therapy evaluation - Social work consult for probable placement - Incentive spirometry - Pain management  Left humeral neck fracture - Conservative management as per orthopedics  Mechanical fall - Fall precautions   Leukocytosis:  - Likely reactive; improving. Repeat a.m. labs. Monitor off antibiotics  Hypokalemia:  - Improved.  HTN: -Continue amlodipine; monitor blood pressure  Hx of stroke:with left sided hemiparesis -Continue Zocor -Resume Plavix once cleared by surgery  HLD: -zocor  Depression and anxiety:. -Continue Zoloft  Thrombocytopenia -Question cause. Monitor   DVT ppx: SCD Code Status:Full code Family Communication:  discussed with son present at bedside Disposition Plan:  will probably need placement    Consultants:ortho  Procedures: Left hip hemiarthroplasty on 12/28/2016  Antimicrobials: None    Subjective: Patient seen and examined at bedside. She denies any overnight fever, nausea or vomiting. She complains of some left hip pain.  Objective: Vitals:   12/28/16 1900 12/28/16 2300 12/29/16 0500 12/29/16 0910  BP: (!) 146/29 (!) 154/42 (!) 122/46 (!) 130/46  Pulse: 61 88 63 75  Resp: 18 18 18    Temp: 98.2 F (36.8 C) 99.7 F (37.6 C) 97.9 F (36.6 C)   TempSrc: Oral Oral Oral   SpO2: 97% 90% 97% 100%  Weight:      Height:        Intake/Output Summary (Last 24 hours) at 12/29/16 1316 Last data filed at 12/29/16 0700  Gross per 24 hour  Intake                0 ml  Output              800 ml  Net             -800 ml   Filed Weights   12/27/16 2305 12/28/16 0913  Weight: 56.2 kg (124 lb) 56.2 kg (124 lb)    Examination:  General exam: Appears calm and comfortable  Respiratory system: Bilateral decreased breath sound at bases Cardiovascular system: S1 & S2 heard, Rate controlled.  Gastrointestinal system: Abdomen is nondistended, soft and nontender. Normal bowel sounds heard. Extremities: No cyanosis, clubbing, edema    Data Reviewed: I have personally reviewed following labs and imaging studies  CBC:  Recent Labs Lab 12/27/16 0301 12/28/16 0309 12/28/16 1302 12/29/16 0323  WBC 20.2* 13.8* 18.3* 14.5*  NEUTROABS 18.7* 11.7*  --   --   HGB 13.4 10.9* 9.7* 9.1*  HCT 39.5 33.1* 29.8* 27.9*  MCV 93.6 95.4 95.5 94.6  PLT 196 144* 122* 120*   Basic Metabolic Panel:  Recent Labs Lab 12/27/16 0301 12/28/16 0309 12/28/16 1302 12/29/16 0323  NA 139 139  --  137  K 3.4* 4.1  --  3.8  CL 104 107  --  104  CO2 27 27  --  25  GLUCOSE 123* 101*  --  105*  BUN 27* 27*  --  23*  CREATININE 0.99 0.98 1.09* 0.99  CALCIUM 10.1 9.0  --  8.7*  MG  --  1.9  --  1.7   GFR: Estimated Creatinine Clearance: 30.8 mL/min (by C-G formula based on SCr of 0.99  mg/dL). Liver Function Tests: No results for input(s): AST, ALT, ALKPHOS, BILITOT, PROT, ALBUMIN in the last 168 hours. No results for input(s): LIPASE, AMYLASE in the last 168 hours. No results for input(s): AMMONIA in the last 168 hours. Coagulation Profile:  Recent Labs Lab 12/27/16 0301  INR 1.02   Cardiac Enzymes: No results for input(s): CKTOTAL, CKMB, CKMBINDEX, TROPONINI in the last 168 hours. BNP (last 3 results) No results for input(s): PROBNP in the last 8760 hours. HbA1C: No results for input(s): HGBA1C in the last 72 hours. CBG: No results for input(s): GLUCAP in the last 168 hours. Lipid Profile: No results for input(s): CHOL, HDL, LDLCALC, TRIG, CHOLHDL, LDLDIRECT in the last 72 hours. Thyroid Function Tests: No results for input(s): TSH, T4TOTAL, FREET4, T3FREE, THYROIDAB in the last 72 hours. Anemia Panel: No results for input(s): VITAMINB12, FOLATE, FERRITIN, TIBC, IRON, RETICCTPCT in the last 72 hours. Sepsis Labs: No results for input(s): PROCALCITON, LATICACIDVEN in the last 168 hours.  Recent Results (from the past 240 hour(s))  Urine culture     Status: Abnormal   Collection Time: 12/27/16  2:55 AM  Result Value Ref Range Status   Specimen Description URINE, CLEAN CATCH  Final   Special Requests NONE  Final   Culture MULTIPLE SPECIES PRESENT, SUGGEST RECOLLECTION (A)  Final   Report Status 12/28/2016 FINAL  Final  Surgical pcr screen     Status: None   Collection Time: 12/28/16  2:31 AM  Result Value Ref Range Status   MRSA, PCR NEGATIVE NEGATIVE Final   Staphylococcus aureus NEGATIVE NEGATIVE Final    Comment:        The Xpert SA Assay (FDA approved for NASAL specimens in patients over 72 years of age), is one component of a comprehensive surveillance program.  Test performance has been validated by [facility] for patients greater than or equal to 23 year old. It is not intended to diagnose infection nor to guide or monitor treatment.           Radiology Studies: Pelvis Portable  Result Date: 12/28/2016 CLINICAL DATA:  LEFT hip replacement EXAM: PORTABLE PELVIS 1-2 VIEWS COMPARISON:  Portable exam 1140 hours compared intraoperative images of 12/28/2016 FINDINGS: LEFT hip prosthesis in expected position. Bones demineralized. No fracture or dislocation. IMPRESSION: LEFT hip prosthesis without acute complication. Electronically Signed   By: Ulyses Southward M.D.   On: 12/28/2016 12:11   Dg C-arm 1-60 Min  Result Date: 12/28/2016 CLINICAL DATA:  Total left anterior hip replacement. Left hip fracture EXAM: DG C-ARM 61-120 MIN; OPERATIVE LEFT HIP WITH PELVIS COMPARISON:  12/27/2016 FINDINGS: C-arm images were obtained in the operating room. Left hip replacement in satisfactory position and alignment. No acute complication IMPRESSION: Satisfactory left hip replacement Electronically Signed   By:  Marlan Palau M.D.   On: 12/28/2016 11:37   Dg Hip Operative Unilat With Pelvis Left  Result Date: 12/28/2016 CLINICAL DATA:  Total left anterior hip replacement. Left hip fracture EXAM: DG C-ARM 61-120 MIN; OPERATIVE LEFT HIP WITH PELVIS COMPARISON:  12/27/2016 FINDINGS: C-arm images were obtained in the operating room. Left hip replacement in satisfactory position and alignment. No acute complication IMPRESSION: Satisfactory left hip replacement Electronically Signed   By: Marlan Palau M.D.   On: 12/28/2016 11:37        Scheduled Meds: . amLODipine  5 mg Oral Daily  . docusate sodium  100 mg Oral BID  . enoxaparin (LOVENOX) injection  30 mg Subcutaneous Q24H  . feeding supplement (ENSURE ENLIVE)  237 mL Oral BID BM  . multivitamin with minerals  1 tablet Oral Daily  . senna  1 tablet Oral BID  . sertraline  50 mg Oral Daily  . simvastatin  40 mg Oral q1800  . vitamin B-12  100 mcg Oral Daily  . vitamin C  500 mg Oral Daily   Continuous Infusions: . sodium chloride 75 mL/hr at 12/28/16 2257  . lactated ringers       LOS: 2  days        Glade Lloyd, MD [facility] Pager [phone]  If 7PM-7AM, please contact night-coverage [email or web address] Password [email or web address] 12/29/2016, 1:16 PM

## 2016-12-29 NOTE — Evaluation (Signed)
Occupational Therapy Evaluation Patient Details Name: Debbie Webb MRN: [id number] DOB: [birth year] Today's Date: 12/29/2016    History of Present Illness Pt is a 83 y.o. female with PMH signficant for CVA with L sided weakness, HTN, hyperlipidemia, depression, and carotid artery stenosis. She presented to the ED following fall in kitchen. Found to have L humeral neck fracture and displaced L femoral neck fracture now s/p L hip hemiarthroplasty (anterior approach).   Clinical Impression   PTA, pt required assistance with dressing/bathing tasks and was ambulating with RW for short distances. She has assistance from a family member daily from 9am-2pm. Pt's son reports an increase in falls recently and concern for his mother being alone. Pt currently requires total assist for LB ADL and toileting hygiene and max assist +2 for toilet transfers. She presents with confusion as well as L hip and shoulder pain limiting her ability to participate in ADL at [facility]. Pt would benefit from continued OT services while admitted to improve independence with ADL and functional mobility. At current functional level, recommend short-term SNF placement post-acute D/C for continued rehabilitation to maximize safety and independence. Pt and son report preference for [facility].     Follow Up Recommendations  SNF;Supervision/Assistance - 24 hour    Equipment Recommendations  Other (comment) (TBD at next venue of care)    Recommendations for Other Services       Precautions / Restrictions Precautions Precautions: Fall Restrictions Weight Bearing Restrictions: Yes LUE Weight Bearing: Non weight bearing LLE Weight Bearing: Weight bearing as tolerated      Mobility Bed Mobility Overal bed mobility: Needs Assistance Bed Mobility: Supine to Sit     Supine to sit: +2 for safety/equipment;Mod assist     General bed mobility comments: Assist to scoot hips with pad and mod assist to raise trunk from bed.    Transfers Overall transfer level: Needs assistance Equipment used: 2 person hand held assist Transfers: Sit to/from [employer] Sit to Stand: Mod assist;+2 physical assistance Stand pivot transfers: Max assist;+2 physical assistance       General transfer comment: Max assist when taking pivotal steps toward chair with significant buckling.     Balance Overall balance assessment: Needs assistance Sitting-balance support: Feet supported;Bilateral upper extremity supported Sitting balance-Leahy Scale: Poor     Standing balance support: Single extremity supported;During functional activity Standing balance-Leahy Scale: Poor                             ADL either performed or assessed with clinical judgement   ADL Overall ADL's : Needs assistance/impaired Eating/Feeding: Set up;Sitting   Grooming: Set up;Sitting;Wash/dry face   Upper Body Bathing: Sitting;Minimal assistance   Lower Body Bathing: Total assistance;Sit to/from stand   Upper Body Dressing : Moderate assistance;Sitting   Lower Body Dressing: Total assistance;Sit to/from stand   Toilet Transfer: Maximal assistance;+2 for physical assistance;Stand-pivot Toilet Transfer Details (indicate cue type and reason): Significant buckling when attempting to step. Toileting- Clothing Manipulation and Hygiene: Total assistance;Sit to/from stand       Functional mobility during ADLs: Maximal assistance;+2 for physical assistance (stand-pivot) General ADL Comments: Pt pleasant and willing to participate in ADL. SpO2 on 2L at rest 98-99%. Removed O2 for transfer and desaturated to 85%. Rebounded to 94% with 2LO2 reapplied.      Vision         Perception     Praxis      Pertinent  Vitals/Pain Pain Assessment: Faces Faces Pain Scale: Hurts even more Pain Location: L hip and shoulder Pain Descriptors / Indicators: Operative site guarding;Aching;Sore Pain Intervention(s): Limited activity  within patient's tolerance;Monitored during session;Repositioned     Hand Dominance Right   Extremity/Trunk Assessment Upper Extremity Assessment Upper Extremity Assessment: LUE deficits/detail LUE Deficits / Details: Immobilized in sling. Able to move digits and wrist. Large bruise on lateral aspect of shoulder.    Lower Extremity Assessment Lower Extremity Assessment: Defer to PT evaluation       Communication     Cognition Arousal/Alertness: Awake/alert Behavior During Therapy: WFL for tasks assessed/performed Overall Cognitive Status: Impaired/Different from baseline Area of Impairment: Orientation;Attention;Problem solving                 Orientation Level: Disoriented to;Place;Situation (at times to situation: "why does my leg hurt?") Current Attention Level: Selective         Problem Solving: Slow processing;Requires verbal cues General Comments: Pt able to converse well but was confused concerning situation.    General Comments       Exercises     Shoulder Instructions      Home Living Family/patient expects to be discharged to:: Private residence Living Arrangements:  (sister-in-law assists from 9-2) Available Help at Discharge: Family;Available 24 hours/day Type of Home: House Home Access: Stairs to enter [employer] of Steps: 6   Home Layout: Two level (basement - does not use the basement)     Bathroom Shower/Tub: Tub/shower unit;Walk-in shower   Bathroom Toilet: Standard     Home Equipment: Bedside commode;Shower seat;Walker - 4 wheels          Prior Functioning/Environment Level of Independence: Needs assistance  Gait / Transfers Assistance Needed: Ambulates with RW. Per son "leaves" it around frequently but will hold on to furniture.  ADL's / Homemaking Assistance Needed: Pt reporting that she bathes and dresses herself. However, son reports that pt only does bird baths and has assistance for ADL.             OT  Problem List: Decreased strength;Decreased activity tolerance;Impaired balance (sitting and/or standing);Decreased safety awareness;Decreased knowledge of use of DME or AE;Decreased knowledge of precautions;Pain      OT Treatment/Interventions: Self-care/ADL training;Therapeutic exercise;Energy conservation;DME and/or AE instruction;Therapeutic activities;Patient/family education;Balance training;Cognitive remediation/compensation    OT Goals(Current goals can be found in the care plan section) Acute Rehab OT Goals Patient Stated Goal: to walk OT Goal Formulation: With patient/family Time For Goal Achievement: 01/12/17 Potential to Achieve Goals: Good ADL Goals Pt Will Perform Grooming: standing;with mod assist Pt Will Transfer to Toilet: with min assist;bedside commode;stand pivot transfer Pt Will Perform Toileting - Clothing Manipulation and hygiene: with min assist;sit to/from stand  OT Frequency: Min 2X/week   Barriers to D/C:            Co-evaluation              AM-PAC PT "6 Clicks" Daily Activity     Outcome Measure Help from another person eating meals?: A Little Help from another person taking care of personal grooming?: A Little Help from another person toileting, which includes using toliet, bedpan, or urinal?: A Lot Help from another person bathing (including washing, rinsing, drying)?: A Lot Help from another person to put on and taking off regular upper body clothing?: A Lot Help from another person to put on and taking off regular lower body clothing?: Total 6 Click Score: 13   End of Session Equipment Utilized  During Treatment: Gait belt (L shoulder sling) Nurse Communication: Mobility status  Activity Tolerance: Patient tolerated treatment well Patient left: in chair;with call bell/phone within reach;with family/visitor present  OT Visit Diagnosis: Pain;Repeated falls (R29.6) Pain - Right/Left: Left Pain - part of body: Hip                Time:  1610-9604 OT Time Calculation (min): 32 min Charges:  OT General Charges $OT Visit: 1 Procedure OT Evaluation $OT Eval Moderate Complexity: 1 Procedure OT Treatments $Self Care/Home Management : 8-22 mins G-Codes:     Doristine Section, MS OTR/L  Pager: [phone]   Selenne Coggin A [place]oka Stringfield 12/29/2016, 1:36 PM

## 2016-12-30 LAB — BASIC METABOLIC PANEL
Anion gap: 5 (ref 5–15)
BUN: 28 mg/dL — AB (ref 6–20)
CHLORIDE: 107 mmol/L (ref 101–111)
CO2: 27 mmol/L (ref 22–32)
CREATININE: 0.98 mg/dL (ref 0.44–1.00)
Calcium: 9.1 mg/dL (ref 8.9–10.3)
GFR calc Af Amer: 55 mL/min — ABNORMAL LOW (ref >60)
GFR calc non Af Amer: 48 mL/min — ABNORMAL LOW (ref >60)
GLUCOSE: 115 mg/dL — AB (ref 65–99)
POTASSIUM: 4.1 mmol/L (ref 3.5–5.1)
Sodium: 139 mmol/L (ref 135–145)

## 2016-12-30 LAB — CBC
HEMATOCRIT: 25.1 % — AB (ref 36.0–46.0)
HEMOGLOBIN: 8.3 g/dL — AB (ref 12.0–15.0)
MCH: 31.2 pg (ref 26.0–34.0)
MCHC: 33.1 g/dL (ref 30.0–36.0)
MCV: 94.4 fL (ref 78.0–100.0)
Platelets: 113 10*3/uL — ABNORMAL LOW (ref 150–400)
RBC: 2.66 MIL/uL — ABNORMAL LOW (ref 3.87–5.11)
RDW: 13.2 % (ref 11.5–15.5)
WBC: 15.5 10*3/uL — ABNORMAL HIGH (ref 4.0–10.5)

## 2016-12-30 LAB — MAGNESIUM: MAGNESIUM: 1.9 mg/dL (ref 1.7–2.4)

## 2016-12-30 NOTE — Progress Notes (Signed)
Patient ID: DEMISHA Webb, female   DOB: [birth year], 83 y.o.   MRN: [id number]  PROGRESS NOTE    Debbie Webb  CHY:[id number] DOB: 05-28-1920 DOA: 12/27/2016 PCP: [name], MD   Brief Narrative:  83 year old female with history of hypertension, hyperlipidemia, stroke with left-sided weakness, depression with anxiety, tremor, carotid artery stenosis status post carotid artery endarterectomy presented with left shoulder and left hip pain of her fall. She was found to have acute left humeral neck fracture and left femoral neck fracture. She underwent left hip hemiarthroplasty on 12/28/2016.    Assessment & Plan:   Principal Problem:   Fracture of femoral neck, left, closed (HCC) Active Problems:   Fall   Hypokalemia   Hypertension   Stroke (HCC)   HLD (hyperlipidemia)   Fx humeral neck, left, closed, initial encounter   Depression with anxiety   Left displaced femoral neck fracture (HCC)   Left femoral neck fracture - Status post left hip hemiarthroplasty on 12/28/2016. Wound care as per orthopedics recommendations. Weightbearing as per orthopedics/physical therapy recommendations - Physical therapy occupational therapy evaluation - Social work consult for probable placement - Incentive spirometry - Pain management  Left humeral neck fracture - Conservative management as per orthopedics  Mechanical fall - Fall precautions   Leukocytosis:  - Likely reactive;improving. Repeat a.m. labs. Monitor off antibiotics  Hypokalemia:  - Improved.  HTN: -Continue amlodipine; monitor blood pressure  Hx of stroke:with left sided hemiparesis -Continue Zocor -Resume Plavix once cleared by surgery  HLD: -zocor  Depression and anxiety:. -Continue Zoloft  Thrombocytopenia -Question cause. Monitor   DVT ppx: Lovenox  Code Status:Full code Family Communication: discussed with son present at bedside Disposition Plan: Nursing home once bed is available    Consultants:ortho  Procedures:Left hip hemiarthroplasty on 12/28/2016  Antimicrobials: None  Subjective: Patient seen and examined at bedside. She denies any overnight fever, nausea or vomiting. She complains of some left hip pain.  Objective: Vitals:   12/30/16 0300 12/30/16 0458 12/30/16 0500 12/30/16 0830  BP:   (!) 127/50 (!) 119/45  Pulse:   70   Resp:   20   Temp:   98.4 F (36.9 C)   TempSrc:   Oral   SpO2: 94% 95% 98%   Weight:      Height:        Intake/Output Summary (Last 24 hours) at 12/30/16 1313 Last data filed at 12/30/16 0932  Gross per 24 hour  Intake              540 ml  Output              700 ml  Net             -160 ml   Filed Weights   12/27/16 2305 12/28/16 0913  Weight: 56.2 kg (124 lb) 56.2 kg (124 lb)    Examination:  General exam: Appears calm and comfortable  Respiratory system: Bilateral decreased breath sound at bases Cardiovascular system: S1 & S2 heard, Rate control  Gastrointestinal system: Abdomen is nondistended, soft and nontender. Normal bowel sounds heard. Extremities: No cyanosis, clubbing, edema     Data Reviewed: I have personally reviewed following labs and imaging studies  CBC:  Recent Labs Lab 12/27/16 0301 12/28/16 0309 12/28/16 1302 12/29/16 0323 12/30/16 0315  WBC 20.2* 13.8* 18.3* 14.5* 15.5*  NEUTROABS 18.7* 11.7*  --   --   --   HGB 13.4 10.9* 9.7* 9.1* 8.3*  HCT 39.5 33.1* 29.8*  27.9* 25.1*  MCV 93.6 95.4 95.5 94.6 94.4  PLT 196 144* 122* 120* 113*   Basic Metabolic Panel:  Recent Labs Lab 12/27/16 0301 12/28/16 0309 12/28/16 1302 12/29/16 0323 12/30/16 0315  NA 139 139  --  137 139  K 3.4* 4.1  --  3.8 4.1  CL 104 107  --  104 107  CO2 27 27  --  25 27  GLUCOSE 123* 101*  --  105* 115*  BUN 27* 27*  --  23* 28*  CREATININE 0.99 0.98 1.09* 0.99 0.98  CALCIUM 10.1 9.0  --  8.7* 9.1  MG  --  1.9  --  1.7 1.9   GFR: Estimated Creatinine Clearance: 31.1 mL/min (by C-G formula based  on SCr of 0.98 mg/dL). Liver Function Tests: No results for input(s): AST, ALT, ALKPHOS, BILITOT, PROT, ALBUMIN in the last 168 hours. No results for input(s): LIPASE, AMYLASE in the last 168 hours. No results for input(s): AMMONIA in the last 168 hours. Coagulation Profile:  Recent Labs Lab 12/27/16 0301  INR 1.02   Cardiac Enzymes: No results for input(s): CKTOTAL, CKMB, CKMBINDEX, TROPONINI in the last 168 hours. BNP (last 3 results) No results for input(s): PROBNP in the last 8760 hours. HbA1C: No results for input(s): HGBA1C in the last 72 hours. CBG: No results for input(s): GLUCAP in the last 168 hours. Lipid Profile: No results for input(s): CHOL, HDL, LDLCALC, TRIG, CHOLHDL, LDLDIRECT in the last 72 hours. Thyroid Function Tests: No results for input(s): TSH, T4TOTAL, FREET4, T3FREE, THYROIDAB in the last 72 hours. Anemia Panel: No results for input(s): VITAMINB12, FOLATE, FERRITIN, TIBC, IRON, RETICCTPCT in the last 72 hours. Sepsis Labs: No results for input(s): PROCALCITON, LATICACIDVEN in the last 168 hours.  Recent Results (from the past 240 hour(s))  Urine culture     Status: Abnormal   Collection Time: 12/27/16  2:55 AM  Result Value Ref Range Status   Specimen Description URINE, CLEAN CATCH  Final   Special Requests NONE  Final   Culture MULTIPLE SPECIES PRESENT, SUGGEST RECOLLECTION (A)  Final   Report Status 12/28/2016 FINAL  Final  Surgical pcr screen     Status: None   Collection Time: 12/28/16  2:31 AM  Result Value Ref Range Status   MRSA, PCR NEGATIVE NEGATIVE Final   Staphylococcus aureus NEGATIVE NEGATIVE Final    Comment:        The Xpert SA Assay (FDA approved for NASAL specimens in patients over 48 years of age), is one component of a comprehensive surveillance program.  Test performance has been validated by [facility] for patients greater than or equal to 67 year old. It is not intended to diagnose infection nor to guide or monitor  treatment.          Radiology Studies: No results found.      Scheduled Meds: . amLODipine  5 mg Oral Daily  . docusate sodium  100 mg Oral BID  . enoxaparin (LOVENOX) injection  30 mg Subcutaneous Q24H  . feeding supplement (ENSURE ENLIVE)  237 mL Oral BID BM  . multivitamin with minerals  1 tablet Oral Daily  . senna  1 tablet Oral BID  . sertraline  50 mg Oral Daily  . simvastatin  40 mg Oral q1800  . vitamin B-12  100 mcg Oral Daily  . vitamin C  500 mg Oral Daily   Continuous Infusions: . lactated ringers       LOS: 3 days  Glade Lloyd, MD [facility] Pager [phone]  If 7PM-7AM, please contact night-coverage [email or web address] Password [email or web address] 12/30/2016, 1:13 PM

## 2016-12-30 NOTE — Progress Notes (Addendum)
Subjective: 2 Days Post-Op Procedure(s) (LRB): ANTERIOR APPROACH HEMI HIP ARTHROPLASTY (Left) Patient reports pain as 4 on 0-10 scale.   The pain is well-controlled and she states that her pain feels more like soreness throughout the leg.  She was able to eat all of her breakfast with no difficulty.  Denies calf pain, chest pain, shortness of breath, fever, chills, nausea, or vomiting.  Objective: Vital signs in last 24 hours: Temp:  [97.8 F (36.6 C)-98.4 F (36.9 C)] 98.4 F (36.9 C) (08/19 0500) Pulse Rate:  [61-78] 70 (08/19 0500) Resp:  [20-21] 20 (08/19 0500) BP: (117-142)/(44-50) 119/45 (08/19 0830) SpO2:  [93 %-100 %] 98 % (08/19 0500)  Intake/Output from previous day: 08/18 0701 - 08/19 0700 In: 420 [P.O.:420] Out: 500 [Urine:500] Intake/Output this shift: Total I/O In: 240 [P.O.:240] Out: 200 [Urine:200]   Recent Labs  12/28/16 0309 12/28/16 1302 12/29/16 0323 12/30/16 0315  HGB 10.9* 9.7* 9.1* 8.3*    Recent Labs  12/29/16 0323 12/30/16 0315  WBC 14.5* 15.5*  RBC 2.95* 2.66*  HCT 27.9* 25.1*  PLT 120* 113*    Recent Labs  12/29/16 0323 12/30/16 0315  NA 137 139  K 3.8 4.1  CL 104 107  CO2 25 27  BUN 23* 28*  CREATININE 0.99 0.98  GLUCOSE 105* 115*  CALCIUM 8.7* 9.1   No results for input(s): LABPT, INR in the last 72 hours.  Alert and oriented x 3 Sensation intact distally Intact pulses distally Dorsiflexion/Plantar flexion intact Incision: dressing C/D/I and no drainage  Assessment/Plan: 2 Days Post-Op Procedure(s) (LRB): ANTERIOR APPROACH HEMI HIP ARTHROPLASTY (Left) Up with therapy - WBAT to the left leg Continue with pain management as needed.  NWB of the left arm. Continue with sling of the left upper extremity Plan for d/c to SNF when cleared by hospitalists.    Karma Greaser 12/30/2016, 10:33 AM

## 2016-12-31 ENCOUNTER — Encounter (HOSPITAL_COMMUNITY): Payer: Self-pay | Admitting: Orthopedic Surgery

## 2016-12-31 ENCOUNTER — Inpatient Hospital Stay
Admission: RE | Admit: 2016-12-31 | Discharge: 2017-01-17 | Disposition: A | Discharge status: Skilled Nursing Facility | Payer: Medicare Other | Source: Ambulatory Visit | Attending: Internal Medicine | Admitting: Internal Medicine

## 2016-12-31 DIAGNOSIS — D649 Anemia, unspecified: Secondary | ICD-10-CM | POA: Diagnosis not present

## 2016-12-31 DIAGNOSIS — I69154 Hemiplegia and hemiparesis following nontraumatic intracerebral hemorrhage affecting left non-dominant side: Secondary | ICD-10-CM | POA: Diagnosis not present

## 2016-12-31 DIAGNOSIS — D509 Iron deficiency anemia, unspecified: Secondary | ICD-10-CM | POA: Diagnosis not present

## 2016-12-31 DIAGNOSIS — S42292A Other displaced fracture of upper end of left humerus, initial encounter for closed fracture: Secondary | ICD-10-CM | POA: Diagnosis not present

## 2016-12-31 DIAGNOSIS — K219 Gastro-esophageal reflux disease without esophagitis: Secondary | ICD-10-CM | POA: Diagnosis not present

## 2016-12-31 DIAGNOSIS — R279 Unspecified lack of coordination: Secondary | ICD-10-CM | POA: Diagnosis not present

## 2016-12-31 DIAGNOSIS — Z96649 Presence of unspecified artificial hip joint: Secondary | ICD-10-CM | POA: Diagnosis not present

## 2016-12-31 DIAGNOSIS — W19XXXD Unspecified fall, subsequent encounter: Secondary | ICD-10-CM | POA: Diagnosis not present

## 2016-12-31 DIAGNOSIS — S72009A Fracture of unspecified part of neck of unspecified femur, initial encounter for closed fracture: Secondary | ICD-10-CM | POA: Diagnosis not present

## 2016-12-31 DIAGNOSIS — R251 Tremor, unspecified: Secondary | ICD-10-CM | POA: Diagnosis not present

## 2016-12-31 DIAGNOSIS — Z9181 History of falling: Secondary | ICD-10-CM | POA: Diagnosis not present

## 2016-12-31 DIAGNOSIS — E785 Hyperlipidemia, unspecified: Secondary | ICD-10-CM | POA: Diagnosis not present

## 2016-12-31 DIAGNOSIS — M6281 Muscle weakness (generalized): Secondary | ICD-10-CM | POA: Diagnosis not present

## 2016-12-31 DIAGNOSIS — S42212D Unspecified displaced fracture of surgical neck of left humerus, subsequent encounter for fracture with routine healing: Secondary | ICD-10-CM | POA: Diagnosis not present

## 2016-12-31 DIAGNOSIS — I6789 Other cerebrovascular disease: Secondary | ICD-10-CM | POA: Diagnosis not present

## 2016-12-31 DIAGNOSIS — I2510 Atherosclerotic heart disease of native coronary artery without angina pectoris: Secondary | ICD-10-CM | POA: Diagnosis not present

## 2016-12-31 DIAGNOSIS — F418 Other specified anxiety disorders: Secondary | ICD-10-CM | POA: Diagnosis not present

## 2016-12-31 DIAGNOSIS — S72012D Unspecified intracapsular fracture of left femur, subsequent encounter for closed fracture with routine healing: Secondary | ICD-10-CM | POA: Diagnosis not present

## 2016-12-31 DIAGNOSIS — S42292D Other displaced fracture of upper end of left humerus, subsequent encounter for fracture with routine healing: Secondary | ICD-10-CM | POA: Diagnosis not present

## 2016-12-31 DIAGNOSIS — S72042D Displaced fracture of base of neck of left femur, subsequent encounter for closed fracture with routine healing: Secondary | ICD-10-CM | POA: Diagnosis not present

## 2016-12-31 DIAGNOSIS — S42212A Unspecified displaced fracture of surgical neck of left humerus, initial encounter for closed fracture: Secondary | ICD-10-CM | POA: Diagnosis not present

## 2016-12-31 DIAGNOSIS — Z96642 Presence of left artificial hip joint: Secondary | ICD-10-CM | POA: Diagnosis not present

## 2016-12-31 DIAGNOSIS — I10 Essential (primary) hypertension: Secondary | ICD-10-CM | POA: Diagnosis not present

## 2016-12-31 DIAGNOSIS — I6930 Unspecified sequelae of cerebral infarction: Secondary | ICD-10-CM | POA: Diagnosis not present

## 2016-12-31 DIAGNOSIS — S72002A Fracture of unspecified part of neck of left femur, initial encounter for closed fracture: Secondary | ICD-10-CM | POA: Diagnosis not present

## 2016-12-31 DIAGNOSIS — R42 Dizziness and giddiness: Secondary | ICD-10-CM | POA: Diagnosis not present

## 2016-12-31 DIAGNOSIS — S3993XA Unspecified injury of pelvis, initial encounter: Secondary | ICD-10-CM | POA: Diagnosis not present

## 2016-12-31 LAB — CBC
HEMATOCRIT: 24.9 % — AB (ref 36.0–46.0)
HEMOGLOBIN: 8.3 g/dL — AB (ref 12.0–15.0)
MCH: 31.4 pg (ref 26.0–34.0)
MCHC: 33.3 g/dL (ref 30.0–36.0)
MCV: 94.3 fL (ref 78.0–100.0)
Platelets: 143 10*3/uL — ABNORMAL LOW (ref 150–400)
RBC: 2.64 MIL/uL — AB (ref 3.87–5.11)
RDW: 13.4 % (ref 11.5–15.5)
WBC: 14.2 10*3/uL — ABNORMAL HIGH (ref 4.0–10.5)

## 2016-12-31 LAB — MAGNESIUM: MAGNESIUM: 2 mg/dL (ref 1.7–2.4)

## 2016-12-31 LAB — BASIC METABOLIC PANEL
Anion gap: 5 (ref 5–15)
BUN: 27 mg/dL — AB (ref 6–20)
CHLORIDE: 104 mmol/L (ref 101–111)
CO2: 28 mmol/L (ref 22–32)
CREATININE: 0.78 mg/dL (ref 0.44–1.00)
Calcium: 9 mg/dL (ref 8.9–10.3)
GFR calc non Af Amer: >60 mL/min (ref >60)
Glucose, Bld: 96 mg/dL (ref 65–99)
POTASSIUM: 4.3 mmol/L (ref 3.5–5.1)
Sodium: 137 mmol/L (ref 135–145)

## 2016-12-31 MED ORDER — OXYCODONE-ACETAMINOPHEN 5-325 MG PO TABS: 1.0000 | ORAL_TABLET | ORAL | 0 refills | Status: AC | PRN

## 2016-12-31 MED ORDER — SENNA 8.6 MG PO TABS: 1.0000 | ORAL_TABLET | Freq: Two times a day (BID) | ORAL | 0 refills | Status: AC

## 2016-12-31 MED ORDER — METHOCARBAMOL 500 MG PO TABS: 500.0000 mg | ORAL_TABLET | Freq: Three times a day (TID) | ORAL | 0 refills | Status: AC | PRN

## 2016-12-31 MED ORDER — POLYETHYLENE GLYCOL 3350 17 G PO PACK: 17.0000 g | PACK | Freq: Every day | ORAL | 0 refills | Status: AC | PRN

## 2016-12-31 MED ORDER — ENOXAPARIN SODIUM 30 MG/0.3ML ~~LOC~~ SOLN: 30.0000 mg | SUBCUTANEOUS | 0 refills | Status: DC

## 2016-12-31 MED ORDER — DOCUSATE SODIUM 100 MG PO CAPS: 100.0000 mg | ORAL_CAPSULE | Freq: Two times a day (BID) | ORAL | 0 refills | Status: AC

## 2016-12-31 NOTE — Social Work (Signed)
Clinical Social Worker facilitated patient discharge including contacting patient family and facility to confirm patient discharge plans.  Clinical information faxed to facility and family agreeable with plan.    CSW arranged ambulance transport via [facility] to [facility].    RN to call [phone] to give report prior to discharge.  Clinical Social Worker will sign off for now as social work intervention is no longer needed. Please consult us again if new need arises.  Sheyla Zaffino, LCSW Clinical Social Worker [phone]    

## 2016-12-31 NOTE — Progress Notes (Signed)
Reviewed AVS with patient. Answered her questions. Called report to [facility] and spoke with Stefano Gaul. Pt is stable and ready for discharge.

## 2016-12-31 NOTE — Progress Notes (Signed)
Physical Therapy Treatment Patient Details Name: Debbie Webb MRN: [id number] DOB: 10-17-1920 Today's Date: 12/31/2016    History of Present Illness Pt is a 83 y.o. female with PMH signficant for CVA with L sided weakness, HTN, hyperlipidemia, depression, and carotid artery stenosis. She presented to the ED on 12/27/16 following fall in kitchen. Found to have L humeral neck fracture and displaced L femoral neck fracture now s/p L hip hemiarthroplasty (anterior approach).   PT Comments    Pt slowly progressing with mobility, remains limited by pain and fatigue. Able to stand and pivot to bedside chair with mod-maxA. Recalled LUE NWB precautions, and reviewed LLE precautions. Son present throughout session and all questions answered regarding continued rehab with SNF-level therapies. SpO2 >90% on 2L  O2. Will continue to follow acutely.   Follow Up Recommendations  SNF     Equipment Recommendations  None recommended by PT    Recommendations for Other Services       Precautions / Restrictions Precautions Precautions: Fall;Anterior Hip Restrictions Weight Bearing Restrictions: Yes LUE Weight Bearing: Non weight bearing LLE Weight Bearing: Weight bearing as tolerated    Mobility  Bed Mobility Overal bed mobility: Needs Assistance Bed Mobility: Supine to Sit     Supine to sit: Max assist     General bed mobility comments: MaxA to elevate trunk into sitting and maneuver LLE to EOB; maxA to scoot towards EOB  Transfers Overall transfer level: Needs assistance Equipment used: None Transfers: Pharmacologist;Sit to/from Stand Sit to Stand: Mod assist;From elevated surface Stand pivot transfers: Max assist       General transfer comment: Stood x3 with modA to elevate trunks. Cues for technique with NWB LUE, reliant on RUE for boost into standing. Stand pivot with maxA, requiring physical assist to weight shift anteriorly due to posterior lean. Further mobility limited  secondary to pain and fatigue  Ambulation/Gait                 Stairs            Wheelchair Mobility    Modified Rankin (Stroke Patients Only)       Balance Overall balance assessment: Needs assistance Sitting-balance support: Feet supported;Single extremity supported Sitting balance-Leahy Scale: Fair     Standing balance support: Single extremity supported;During functional activity Standing balance-Leahy Scale: Poor                              Cognition Arousal/Alertness: Awake/alert Behavior During Therapy: WFL for tasks assessed/performed Overall Cognitive Status: Impaired/Different from baseline Area of Impairment: Following commands;Problem solving;Attention                   Current Attention Level: Selective   Following Commands: Follows multi-step commands inconsistently     Problem Solving: Slow processing;Requires verbal cues;Decreased initiation        Exercises      General Comments        Pertinent Vitals/Pain Pain Assessment: Faces Faces Pain Scale: Hurts a little bit Pain Location: L hip Pain Descriptors / Indicators: Operative site guarding;Aching;Sore Pain Intervention(s): Limited activity within patient's tolerance;Monitored during session;Repositioned    Home Living                      Prior Function            PT Goals (current goals can now be found in the care plan section)  Acute Rehab PT Goals Patient Stated Goal: to walk PT Goal Formulation: With patient/family Time For Goal Achievement: 01/12/17 Potential to Achieve Goals: Good Progress towards PT goals: Progressing toward goals    Frequency    Min 3X/week      PT Plan Current plan remains appropriate    Co-evaluation              AM-PAC PT "6 Clicks" Daily Activity  Outcome Measure  Difficulty turning over in bed (including adjusting bedclothes, sheets and blankets)?: Unable Difficulty moving from lying on  back to sitting on the side of the bed? : Unable Difficulty sitting down on and standing up from a chair with arms (e.g., wheelchair, bedside commode, etc,.)?: Unable Help needed moving to and from a bed to chair (including a wheelchair)?: A Lot Help needed walking in hospital room?: Total Help needed climbing 3-5 steps with a railing? : Total 6 Click Score: 7    End of Session Equipment Utilized During Treatment: Gait belt;Oxygen Activity Tolerance: Patient limited by fatigue Patient left: with call bell/phone within reach;with family/visitor present;in chair Nurse Communication: Mobility status PT Visit Diagnosis: Muscle weakness (generalized) (M62.81);History of falling (Z91.81);Repeated falls (R29.6);Difficulty in walking, not elsewhere classified (R26.2);Hemiplegia and hemiparesis;Pain Hemiplegia - Right/Left: Left Hemiplegia - dominant/non-dominant: Non-dominant Hemiplegia - caused by: Cerebral infarction (20 years ago) Pain - Right/Left: Left Pain - part of body: Shoulder;Hip     Time: 1610-9604 PT Time Calculation (min) (ACUTE ONLY): 25 min  Charges:  $Therapeutic Activity: 23-37 mins                    G Codes:      Ina Homes, PT, DPT [phone] Acute Rehab  Malachy Chamber 12/31/2016, 10:03 AM

## 2016-12-31 NOTE — Discharge Summary (Signed)
Physician Discharge Summary  Debbie Webb:[id number] DOB: July 06, 1920 DOA: 12/27/2016  PCP: [name], MD  Admit date: 12/27/2016 Discharge date: 12/31/2016  Admitted From: Home Disposition:  Nursing home  Recommendations for Outpatient Follow-up:  1. Follow up with nursing home provider at earliest convenience 2. Please obtain BMP/CBC in one week 3. Follow-up with orthopedics/Dr. Linna Caprice as scheduled 4. Activity as per orthopedics/physical therapy recommendations 5. Fall precautions  Home Health: No  Equipment/Devices: None  Discharge Condition: Stable CODE STATUS: Full  Diet recommendation: Heart Healthy   Brief/Interim Summary: 83 year old female with history of hypertension, hyperlipidemia, stroke with left-sided weakness, depression with anxiety, tremor, carotid artery stenosis status post carotid artery endarterectomy presented with left shoulder and left hip pain of her fall. She was found to have acute left humeral neck fracture and left femoral neck fracture. She underwent left hip hemiarthroplasty on 12/28/2016.   Discharge Diagnoses:  Principal Problem:   Fracture of femoral neck, left, closed (HCC) Active Problems:   Fall   Hypokalemia   Hypertension   Stroke (HCC)   HLD (hyperlipidemia)   Fx humeral neck, left, closed, initial encounter   Depression with anxiety   Left displaced femoral neck fracture (HCC)  Left femoral neck fracture - Status post left hip hemiarthroplasty on 12/28/2016. Wound care as per orthopedics recommendations. Weightbearing as per orthopedics/physical therapy recommendations -  Discharged to nursing home once bed is available - Outpatient follow-up with orthopedics as scheduled - Lovenox for DVT prophylaxis; duration will be determined by orthopedics at follow-up -Continue physical therapy in the nursing home  Left humeral neck fracture - Conservative management as per orthopedics  Mechanical fall - Fall  precautions   Leukocytosis:  - Likely reactive;improving.  Monitor off antibiotics - Outpatient follow-up  Hypokalemia:  - Improved.  HTN: -Continue amlodipine and hydrochlorothiazide  Hx of stroke:with left sided hemiparesis -Continue Zocor -Resume Plavix   HLD: -zocor  Depression and anxiety:. -Continue Zoloft  Thrombocytopenia -Question cause. Outpatient follow-up  Discharge Instructions  Discharge Instructions    Call MD for:  difficulty breathing, headache or visual disturbances    Complete by:  As directed    Call MD for:  hives    Complete by:  As directed    Call MD for:  persistant dizziness or light-headedness    Complete by:  As directed    Call MD for:  persistant nausea and vomiting    Complete by:  As directed    Call MD for:  severe uncontrolled pain    Complete by:  As directed    Call MD for:  temperature >100.4    Complete by:  As directed    Diet - low sodium heart healthy    Complete by:  As directed    Discharge instructions    Complete by:  As directed    Activity as per orthopedics/physical therapy recommendations  Fall precautions   Increase activity slowly    Complete by:  As directed      Allergies as of 12/31/2016      Reactions   No Known Allergies       Medication List    STOP taking these medications   ALEVE 220 MG tablet Generic drug:  naproxen sodium     TAKE these medications   amLODipine 5 MG tablet Commonly known as:  NORVASC Take 5 mg by mouth daily.   CENTRUM SILVER PO Take 1 tablet by mouth daily.   clopidogrel 75 MG tablet Commonly known  as:  PLAVIX Take 75 mg by mouth daily.   docusate sodium 100 MG capsule Commonly known as:  COLACE Take 1 capsule (100 mg total) by mouth 2 (two) times daily.   enoxaparin 30 MG/0.3ML injection Commonly known as:  LOVENOX Inject 0.3 mLs (30 mg total) into the skin daily.   ENSURE Take 237 mLs by mouth 2 (two) times daily between meals.    hydrochlorothiazide 25 MG tablet Commonly known as:  HYDRODIURIL Take 25 mg by mouth every other day.   meprobamate 400 MG tablet Commonly known as:  EQUANIL Take 400 mg by mouth as needed for anxiety.   methocarbamol 500 MG tablet Commonly known as:  ROBAXIN Take 1 tablet (500 mg total) by mouth every 8 (eight) hours as needed for muscle spasms.   oxybutynin 5 MG tablet Commonly known as:  DITROPAN Take 5 mg by mouth every 8 (eight) hours as needed for bladder spasms.   oxyCODONE-acetaminophen 5-325 MG tablet Commonly known as:  PERCOCET/ROXICET Take 1 tablet by mouth every 4 (four) hours as needed for moderate pain.   polyethylene glycol packet Commonly known as:  MIRALAX / GLYCOLAX Take 17 g by mouth daily as needed for mild constipation.   senna 8.6 MG Tabs tablet Commonly known as:  SENOKOT Take 1 tablet (8.6 mg total) by mouth 2 (two) times daily.   sertraline 50 MG tablet Commonly known as:  ZOLOFT Take 50 mg by mouth daily.   simvastatin 40 MG tablet Commonly known as:  ZOCOR Take 40 mg by mouth daily.   vitamin B-12 100 MCG tablet Commonly known as:  CYANOCOBALAMIN Take 100 mcg by mouth daily.   vitamin C 500 MG tablet Commonly known as:  ASCORBIC ACID Take 500 mg by mouth daily.      Follow-up Information    Swinteck, Arlys John, MD. Schedule an appointment as soon as possible for a visit in 2 week(s).   Specialty:  Orthopedic Surgery Why:  For wound re-check Contact information: [address] [place] [address] [phone]        [name], MD Follow up in 1 week(s).   Specialty:  Internal Medicine Contact information: [address] [place] [address] [phone]          Allergies  Allergen Reactions  . No Known Allergies     Consultations:  Orthopedics   Procedures/Studies: Ct Head Wo Contrast  Result Date: 12/27/2016 CLINICAL DATA:  Status post fall at home, with concern for head or  cervical spine injury. Initial encounter. EXAM: CT HEAD WITHOUT CONTRAST CT CERVICAL SPINE WITHOUT CONTRAST TECHNIQUE: Multidetector CT imaging of the head and cervical spine was performed following the standard protocol without intravenous contrast. Multiplanar CT image reconstructions of the cervical spine were also generated. COMPARISON:  CT of the head performed 12/28/2003 FINDINGS: CT HEAD FINDINGS Brain: No evidence of acute infarction, hemorrhage, hydrocephalus, extra-axial collection or mass lesion/mass effect. Prominence of the ventricles and sulci reflects mild cortical volume loss. Mild cerebellar atrophy is noted. Scattered periventricular white matter change likely reflects small vessel ischemic microangiopathy. The brainstem and fourth ventricle are within normal limits. The basal ganglia are unremarkable in appearance. The cerebral hemispheres demonstrate grossly normal gray-white differentiation. No mass effect or midline shift is seen. Vascular: No hyperdense vessel or unexpected calcification. Skull: There is no evidence of fracture; visualized osseous structures are unremarkable in appearance. Sinuses/Orbits: The orbits are within normal limits. The paranasal sinuses and mastoid air cells are well-aerated. Other:  No significant soft tissue abnormalities are seen. CT CERVICAL SPINE FINDINGS Alignment: Normal. Skull base and vertebrae: No acute fracture. No primary bone lesion or focal pathologic process. Soft tissues and spinal canal: No prevertebral fluid or swelling. No visible canal hematoma. Disc levels: Mild intervertebral disc space narrowing is noted along the lower cervical spine, with scattered anterior and posterior disc osteophyte complexes. Mild degenerative change is noted about the dens. Upper chest: The visualized lung apices are clear. The thyroid gland is somewhat heterogeneous but otherwise unremarkable. Scattered calcification is noted at the carotid bifurcations bilaterally.  There is likely moderate to severe luminal narrowing at the proximal left internal carotid artery. Other: No additional soft tissue abnormalities are seen. IMPRESSION: 1. No evidence of traumatic intracranial injury or fracture. 2. No evidence of fracture or subluxation along the cervical spine. 3. Mild cortical volume loss and scattered small vessel ischemic microangiopathy. 4. Mild degenerative change along the lower cervical spine. 5. Scattered calcification at the carotid bifurcations bilaterally. Likely moderate to severe luminal narrowing at the proximal left internal carotid artery. Carotid ultrasound would be helpful for further evaluation, when and as deemed clinically appropriate. Electronically Signed   By: Roanna Raider M.D.   On: 12/27/2016 02:32   Ct Cervical Spine Wo Contrast  Result Date: 12/27/2016 CLINICAL DATA:  Status post fall at home, with concern for head or cervical spine injury. Initial encounter. EXAM: CT HEAD WITHOUT CONTRAST CT CERVICAL SPINE WITHOUT CONTRAST TECHNIQUE: Multidetector CT imaging of the head and cervical spine was performed following the standard protocol without intravenous contrast. Multiplanar CT image reconstructions of the cervical spine were also generated. COMPARISON:  CT of the head performed 12/28/2003 FINDINGS: CT HEAD FINDINGS Brain: No evidence of acute infarction, hemorrhage, hydrocephalus, extra-axial collection or mass lesion/mass effect. Prominence of the ventricles and sulci reflects mild cortical volume loss. Mild cerebellar atrophy is noted. Scattered periventricular white matter change likely reflects small vessel ischemic microangiopathy. The brainstem and fourth ventricle are within normal limits. The basal ganglia are unremarkable in appearance. The cerebral hemispheres demonstrate grossly normal gray-white differentiation. No mass effect or midline shift is seen. Vascular: No hyperdense vessel or unexpected calcification. Skull: There is no  evidence of fracture; visualized osseous structures are unremarkable in appearance. Sinuses/Orbits: The orbits are within normal limits. The paranasal sinuses and mastoid air cells are well-aerated. Other: No significant soft tissue abnormalities are seen. CT CERVICAL SPINE FINDINGS Alignment: Normal. Skull base and vertebrae: No acute fracture. No primary bone lesion or focal pathologic process. Soft tissues and spinal canal: No prevertebral fluid or swelling. No visible canal hematoma. Disc levels: Mild intervertebral disc space narrowing is noted along the lower cervical spine, with scattered anterior and posterior disc osteophyte complexes. Mild degenerative change is noted about the dens. Upper chest: The visualized lung apices are clear. The thyroid gland is somewhat heterogeneous but otherwise unremarkable. Scattered calcification is noted at the carotid bifurcations bilaterally. There is likely moderate to severe luminal narrowing at the proximal left internal carotid artery. Other: No additional soft tissue abnormalities are seen. IMPRESSION: 1. No evidence of traumatic intracranial injury or fracture. 2. No evidence of fracture or subluxation along the cervical spine. 3. Mild cortical volume loss and scattered small vessel ischemic microangiopathy. 4. Mild degenerative change along the lower cervical spine. 5. Scattered calcification at the carotid bifurcations bilaterally. Likely moderate to severe luminal narrowing at the proximal left internal carotid artery. Carotid ultrasound would be helpful for further evaluation, when and as deemed  clinically appropriate. Electronically Signed   By: Roanna Raider M.D.   On: 12/27/2016 02:32   Pelvis Portable  Result Date: 12/28/2016 CLINICAL DATA:  LEFT hip replacement EXAM: PORTABLE PELVIS 1-2 VIEWS COMPARISON:  Portable exam 1140 hours compared intraoperative images of 12/28/2016 FINDINGS: LEFT hip prosthesis in expected position. Bones demineralized. No  fracture or dislocation. IMPRESSION: LEFT hip prosthesis without acute complication. Electronically Signed   By: Ulyses Southward M.D.   On: 12/28/2016 12:11   Dg Chest Port 1 View  Result Date: 12/27/2016 CLINICAL DATA:  Preoperative chest radiograph for hip and shoulder fractures. Initial encounter. EXAM: PORTABLE CHEST 1 VIEW COMPARISON:  Chest radiograph performed 02/07/2016 FINDINGS: The lungs are well-aerated. Left midlung scarring is noted. There is no evidence of pleural effusion or pneumothorax. The cardiomediastinal silhouette is is mildly enlarged. The masslike density overlying the right hilum reflects ingested material filling the previously noted esophageal diverticulum. The patient's known left humeral neck fracture is partially characterized. No acute displaced rib fractures are seen. Chronic left-sided rib deformities are noted. IMPRESSION: 1. Mild left midlung scarring noted. Lungs otherwise clear. No acute displaced rib fracture seen. 2. Mild cardiomegaly. 3. Ingested material seen filling the previously noted esophageal diverticulum, overlying the right hilum. 4. Known left humeral neck fracture is partially characterized. Electronically Signed   By: Roanna Raider M.D.   On: 12/27/2016 03:17   Dg Shoulder Left  Result Date: 12/27/2016 CLINICAL DATA:  Fall with pain on the left side EXAM: LEFT SHOULDER - 2+ VIEW COMPARISON:  01/04/2016 FINDINGS: Old distal left clavicle fracture.  Old left upper rib fractures. Acute left humeral neck fracture with less than 1/2 bone with of central displacement on axillary view. No dislocation. IMPRESSION: 1. Diffuse osteopenia. 2. Acute left humeral neck fracture. No dislocation of the humeral head 3. Old left clavicle and rib fractures Electronically Signed   By: Jasmine Pang M.D.   On: 12/27/2016 01:44   Dg C-arm 1-60 Min  Result Date: 12/28/2016 CLINICAL DATA:  Total left anterior hip replacement. Left hip fracture EXAM: DG C-ARM 61-120 MIN; OPERATIVE  LEFT HIP WITH PELVIS COMPARISON:  12/27/2016 FINDINGS: C-arm images were obtained in the operating room. Left hip replacement in satisfactory position and alignment. No acute complication IMPRESSION: Satisfactory left hip replacement Electronically Signed   By: Marlan Palau M.D.   On: 12/28/2016 11:37   Dg Hip Operative Unilat With Pelvis Left  Result Date: 12/28/2016 CLINICAL DATA:  Total left anterior hip replacement. Left hip fracture EXAM: DG C-ARM 61-120 MIN; OPERATIVE LEFT HIP WITH PELVIS COMPARISON:  12/27/2016 FINDINGS: C-arm images were obtained in the operating room. Left hip replacement in satisfactory position and alignment. No acute complication IMPRESSION: Satisfactory left hip replacement Electronically Signed   By: Marlan Palau M.D.   On: 12/28/2016 11:37   Dg Hip Unilat With Pelvis 2-3 Views Left  Result Date: 12/27/2016 CLINICAL DATA:  Status post fall on left side, with left hip pain. Initial encounter. EXAM: DG HIP (WITH OR WITHOUT PELVIS) 2-3V LEFT COMPARISON:  None. FINDINGS: There is a mildly displaced subcapital fracture through the left femoral neck. The left femoral head remains seated at the acetabulum. No additional fractures are seen. The right hip joint is unremarkable in appearance. Mild degenerative change is noted at the lower lumbar spine. The sacroiliac joints are grossly unremarkable. The visualized bowel gas pattern is grossly unremarkable. Scattered vascular calcifications are seen. IMPRESSION: Mildly displaced subcapital fracture through the left femoral neck. Electronically Signed  By: Roanna Raider M.D.   On: 12/27/2016 01:49    Left hip hemiarthroplasty on 12/28/2016   Subjective: Patient seen and examined at bedside. She denies any overnight fever, nausea or vomiting.  Discharge Exam: Vitals:   12/31/16 0500 12/31/16 0822  BP: (!) 156/124 (!) 163/61  Pulse: 76 99  Resp: (!) 22   Temp: 98.4 F (36.9 C)   SpO2: 98% 95%   Vitals:   12/30/16  1609 12/30/16 1900 12/31/16 0500 12/31/16 0822  BP: (!) 140/41 (!) 119/96 (!) 156/124 (!) 163/61  Pulse: 82 96 76 99  Resp: 20 20 (!) 22   Temp: 97.7 F (36.5 C) 99 F (37.2 C) 98.4 F (36.9 C)   TempSrc: Oral Oral Oral   SpO2: 97% 91% 98% 95%  Weight:      Height:        General: Pt is alert, awake, not in acute distress. Elderly female lying in bed Cardiovascular: Rate controlled, S1/S2 + Respiratory: Bilateral decreased breath sounds at bases with some scattered crackles  Abdominal: Soft, NT, ND, bowel sounds + Extremities: no edema, no cyanosis    The results of significant diagnostics from this hospitalization (including imaging, microbiology, ancillary and laboratory) are listed below for reference.     Microbiology: Recent Results (from the past 240 hour(s))  Urine culture     Status: Abnormal   Collection Time: 12/27/16  2:55 AM  Result Value Ref Range Status   Specimen Description URINE, CLEAN CATCH  Final   Special Requests NONE  Final   Culture MULTIPLE SPECIES PRESENT, SUGGEST RECOLLECTION (A)  Final   Report Status 12/28/2016 FINAL  Final  Surgical pcr screen     Status: None   Collection Time: 12/28/16  2:31 AM  Result Value Ref Range Status   MRSA, PCR NEGATIVE NEGATIVE Final   Staphylococcus aureus NEGATIVE NEGATIVE Final    Comment:        The Xpert SA Assay (FDA approved for NASAL specimens in patients over 60 years of age), is one component of a comprehensive surveillance program.  Test performance has been validated by [facility] for patients greater than or equal to 32 year old. It is not intended to diagnose infection nor to guide or monitor treatment.      Labs: BNP (last 3 results)  Recent Labs  12/27/16 0301  BNP 101.0*   Basic Metabolic Panel:  Recent Labs Lab 12/27/16 0301 12/28/16 0309 12/28/16 1302 12/29/16 0323 12/30/16 0315 12/31/16 0317  NA 139 139  --  137 139 137  K 3.4* 4.1  --  3.8 4.1 4.3  CL 104 107  --   104 107 104  CO2 27 27  --  25 27 28   GLUCOSE 123* 101*  --  105* 115* 96  BUN 27* 27*  --  23* 28* 27*  CREATININE 0.99 0.98 1.09* 0.99 0.98 0.78  CALCIUM 10.1 9.0  --  8.7* 9.1 9.0  MG  --  1.9  --  1.7 1.9 2.0   Liver Function Tests: No results for input(s): AST, ALT, ALKPHOS, BILITOT, PROT, ALBUMIN in the last 168 hours. No results for input(s): LIPASE, AMYLASE in the last 168 hours. No results for input(s): AMMONIA in the last 168 hours. CBC:  Recent Labs Lab 12/27/16 0301 12/28/16 0309 12/28/16 1302 12/29/16 0323 12/30/16 0315 12/31/16 0317  WBC 20.2* 13.8* 18.3* 14.5* 15.5* 14.2*  NEUTROABS 18.7* 11.7*  --   --   --   --  HGB 13.4 10.9* 9.7* 9.1* 8.3* 8.3*  HCT 39.5 33.1* 29.8* 27.9* 25.1* 24.9*  MCV 93.6 95.4 95.5 94.6 94.4 94.3  PLT 196 144* 122* 120* 113* 143*   Cardiac Enzymes: No results for input(s): CKTOTAL, CKMB, CKMBINDEX, TROPONINI in the last 168 hours. BNP: Invalid input(s): POCBNP CBG: No results for input(s): GLUCAP in the last 168 hours. D-Dimer No results for input(s): DDIMER in the last 72 hours. Hgb A1c No results for input(s): HGBA1C in the last 72 hours. Lipid Profile No results for input(s): CHOL, HDL, LDLCALC, TRIG, CHOLHDL, LDLDIRECT in the last 72 hours. Thyroid function studies No results for input(s): TSH, T4TOTAL, T3FREE, THYROIDAB in the last 72 hours.  Invalid input(s): FREET3 Anemia work up No results for input(s): VITAMINB12, FOLATE, FERRITIN, TIBC, IRON, RETICCTPCT in the last 72 hours. Urinalysis    Component Value Date/Time   COLORURINE YELLOW 12/27/2016 0255   APPEARANCEUR CLOUDY (A) 12/27/2016 0255   LABSPEC 1.013 12/27/2016 0255   PHURINE 8.0 12/27/2016 0255   GLUCOSEU NEGATIVE 12/27/2016 0255   HGBUR NEGATIVE 12/27/2016 0255   BILIRUBINUR NEGATIVE 12/27/2016 0255   KETONESUR NEGATIVE 12/27/2016 0255   PROTEINUR 30 (A) 12/27/2016 0255   UROBILINOGEN 0.2 03/29/2015 0650   NITRITE NEGATIVE 12/27/2016 0255    LEUKOCYTESUR NEGATIVE 12/27/2016 0255   Sepsis Labs Invalid input(s): PROCALCITONIN,  WBC,  LACTICIDVEN Microbiology Recent Results (from the past 240 hour(s))  Urine culture     Status: Abnormal   Collection Time: 12/27/16  2:55 AM  Result Value Ref Range Status   Specimen Description URINE, CLEAN CATCH  Final   Special Requests NONE  Final   Culture MULTIPLE SPECIES PRESENT, SUGGEST RECOLLECTION (A)  Final   Report Status 12/28/2016 FINAL  Final  Surgical pcr screen     Status: None   Collection Time: 12/28/16  2:31 AM  Result Value Ref Range Status   MRSA, PCR NEGATIVE NEGATIVE Final   Staphylococcus aureus NEGATIVE NEGATIVE Final    Comment:        The Xpert SA Assay (FDA approved for NASAL specimens in patients over 71 years of age), is one component of a comprehensive surveillance program.  Test performance has been validated by [facility] for patients greater than or equal to 21 year old. It is not intended to diagnose infection nor to guide or monitor treatment.      Time coordinating discharge: 35  minutes  SIGNED:   Glade Lloyd, MD  [facility] 12/31/2016, 1:29 PM Pager: [phone]  If 7PM-7AM, please contact night-coverage [email or web address] Password [email or web address]

## 2016-12-31 NOTE — Clinical Social Work Placement (Signed)
   CLINICAL SOCIAL WORK PLACEMENT  NOTE  Date:  12/31/2016  Patient Details  Name: Debbie Webb MRN: [id number] Date of Birth: 1921-01-31  Clinical Social Work is seeking post-discharge placement for this patient at the Skilled  Nursing Facility level of care (*CSW will initial, date and re-position this form in  chart as items are completed):  Yes   Patient/family provided with [facility] Clinical Social Work Department's list of facilities offering this level of care within the geographic area requested by the patient (or if unable, by the patient's family).  Yes   Patient/family informed of their freedom to choose among providers that offer the needed level of care, that participate in Medicare, Medicaid or managed care program needed by the patient, have an available bed and are willing to accept the patient.  Yes   Patient/family informed of [facility]'s ownership interest in [facility] and [facility], as well as of the fact that they are under no obligation to receive care at these facilities.  PASRR submitted to EDS on       PASRR number received on 12/31/16     Existing PASRR number confirmed on       FL2 transmitted to all facilities in geographic area requested by pt/family on 12/31/16     FL2 transmitted to all facilities within larger geographic area on       Patient informed that his/her managed care company has contracts with or will negotiate with certain facilities, including the following:        Yes   Patient/family informed of bed offers received.  Patient chooses bed at [facility]     Physician recommends and patient chooses bed at      Patient to be transferred to [facility] on 12/31/16.  Patient to be transferred to facility by [facility]     Patient family notified on 12/31/16 of transfer.  Name of family member notified:  son advised     PHYSICIAN Please prepare priority discharge summary, including medications, Please  prepare prescriptions, Please sign FL2     Additional Comment:    _______________________________________________ Tresa Moore, LCSW 12/31/2016, 3:26 PM

## 2016-12-31 NOTE — Clinical Social Work Note (Signed)
Clinical Social Work Assessment  Patient Details  Name: ANETRA CZERWINSKI MRN: [id number] Date of Birth: 15-Sep-1920  Date of referral:  12/27/16               Reason for consult:  Facility Placement                Permission sought to share information with:    Permission granted to share information::  Yes, Verbal Permission Granted  Name::     Coralyn Mark son, [phone]  Agency::  SNF  Relationship::     Contact Information:     Housing/Transportation Living arrangements for the past 2 months:  Single Family Home (from home alone. ) Source of Information:  Patient Patient Interpreter Needed:  None Criminal Activity/Legal Involvement Pertinent to Current Situation/Hospitalization:  No - Comment as needed Significant Relationships:  Adult Children, Other Family Members Lives with:  Self Do you feel safe going back to the place where you live?  Yes Need for family participation in patient care:  Yes (Comment)  Care giving concerns:  Patient from home alone and has good family support that rotates the help at home.  Pt unsafe to return home at this time given the new impairment.  Social Worker assessment / plan:  CSW met with patient and son at bedside today to discuss recommendations for SNF at [place]. CSW explained her role and the SNF options/placement. Son indicated that he wants patient at [place] [facility] as it is close to him and east to get too. CSW obtained permission to send out to SNF.  FL2 completed. Passr obtained. Offer sent.  Employment status:  Retired Nurse, adult PT Recommendations:  Not assessed at this time Information / Referral to community resources:  [place]  Patient/Family's Response to care:  Family appreciative of CSW assistance. No issues or concerns reported at this time.  Patient/Family's Understanding of and Emotional Response to Diagnosis, Current Treatment, and Prognosis:  Patient/family has good understanding of  diagnosis, Current treatment, Prognosis and is hopeful that short term rehabilitation will improve impairment.  Family reports no issues or concerns at this time.  Emotional Assessment Appearance:  Appears stated age Attitude/Demeanor/Rapport:    Affect (typically observed):  Accepting, Adaptable Orientation:  Oriented to Self, Oriented to Place, Oriented to  Time, Oriented to Situation Alcohol / Substance use:  Not Applicable Psych involvement (Current and /or in the community):  No (Comment)  Discharge Needs  Concerns to be addressed:  No discharge needs identified Readmission within the last 30 days:  No Current discharge risk:  None Barriers to Discharge:  No Barriers Identified   Normajean Baxter, LCSW 12/31/2016, 1:51 PM

## 2016-12-31 NOTE — Plan of Care (Signed)
Problem: Pain Management: Goal: Pain level will decrease Pt voices understanding of the pain scale and asks for pain medication when needed.

## 2016-12-31 NOTE — Progress Notes (Signed)
Nutrition Follow-up  DOCUMENTATION CODES:   Not applicable  INTERVENTION:  Recommend downgrading diet to Dysphagia 3 once discharged to SNF.   Recommend continuation of Ensure Enlive po BID, each supplement provides 350 kcal and 20 grams of protein.  NUTRITION DIAGNOSIS:   Increased nutrient needs related to  (post op healing) as evidenced by estimated needs; ongoing  GOAL:   Patient will meet greater than or equal to 90% of their needs; progressing  MONITOR:   PO intake, Supplement acceptance, Labs, Weight trends, Skin, I & O's  REASON FOR ASSESSMENT:   Consult Assessment of nutrition requirement/status  ASSESSMENT:   83 y.o. female with medical history significant of hypertension, hyperlipidemia, stroke with left sided paralysis, depression with anxiety, tremor, carotid artery stenosis (s/p of carotid artery endarterectomy), who presents with pain in the left shoulder and the left hip after fall. X-ray of left pelvis showed mildly displaced subcapital fracture through the left femoral neck.  PROCEDURE (8/17): Left hip hemiarthroplasty, anterior approach.   Meal completion has been 25-100%. Family reports pt is eating well currently and PTA however has trouble cutting up her food at meals. Plans to discharge to SNF today. Recommend downgrading diet to Dysphagia 3 diet to improve meal completion. Pt currently has Ensure ordered and has been consuming them. Recommend continuation of Ensure post discharge for adequate nutrition.   Labs and medications reviewed.   Diet Order:  Diet regular Room service appropriate? Yes; Fluid consistency: Thin Diet - low sodium heart healthy  Skin:   (Incision on L hip)  Last BM:  8/19  Height:   Ht Readings from Last 1 Encounters:  12/28/16 5\' 5"  (1.651 m)    Weight:   Wt Readings from Last 1 Encounters:  12/28/16 124 lb (56.2 kg)    Ideal Body Weight:  56.8 kg  BMI:  Body mass index is 20.63 kg/m.  Estimated Nutritional  Needs:   Kcal:  1400-1600  Protein:  55-65 grams  Fluid:  >/= 1.5 L/day  EDUCATION NEEDS:   No education needs identified at this time  Roslyn Smiling, MS, RD, LDN Pager # [phone] After hours/ weekend pager # [phone]

## 2016-12-31 NOTE — Care Management Important Message (Signed)
Important Message  Patient Details  Name: Debbie Webb MRN: [id number] Date of Birth: [birth year]   Medicare Important Message Given:  Yes    Dorena Bodo 12/31/2016, 12:38 PM

## 2016-12-31 NOTE — [place] FL2 (Signed)
[place] MEDICAID FL2 LEVEL OF CARE SCREENING TOOL     IDENTIFICATION  Patient Name: Debbie Webb Birthdate: Oct 08, 1920 Sex: female Admission Date (Current Location): 12/27/2016  [facility] and [place] Number:  Producer, television/film/video and Address:  [facility], [address], [place], [place] [address]      Provider Number: [id number]  Attending Physician Name and Address:  Glade Lloyd, MD  Relative Name and Phone Number:  Janae Sauce [phone]    Current Level of Care: Hospital Recommended Level of Care: Skilled Nursing Facility Prior Approval Number:    Date Approved/Denied: 12/31/16 PASRR Number: [id number]  Discharge Plan: SNF    Current Diagnoses: Patient Active Problem List   Diagnosis Date Noted  . Left displaced femoral neck fracture (HCC) 12/28/2016  . HLD (hyperlipidemia) 12/27/2016  . Fracture of femoral neck, left, closed (HCC) 12/27/2016  . Fx humeral neck, left, closed, initial encounter 12/27/2016  . Depression with anxiety 12/27/2016  . Essential hypertension, benign   . Aortic stenosis 09/12/2012  . GERD (gastroesophageal reflux disease) 01/26/2012  . Atypical chest pain 01/25/2012  . Hypertension   . Carotid artery disease (HCC)   . Ankle sprain 08/23/2011  . Hypothermia 08/03/2011  . Fall 08/03/2011  . Hypokalemia 08/03/2011  . Leukocytosis 08/03/2011  . Azotemia 08/03/2011  . Left hip pain 08/03/2011  . Personal history of stroke with residual effects 08/03/2011  . Hemiparesis affecting left side as late effect of cerebrovascular accident (HCC) 08/03/2011  . Bradycardia 08/03/2011  . Hand ulceration (HCC) 08/03/2011  . Elevated LFTs 08/03/2011  . Elevated troponin I level 08/03/2011  . Rhabdomyolysis 08/03/2011  . Stroke (HCC) 05/15/1995    Orientation RESPIRATION BLADDER Height & Weight     Self, Time, Situation, Place  O2 (Nasal Cannula) Continent Weight: 124 lb (56.2 kg) Height:  5\' 5"  (165.1 cm)   BEHAVIORAL SYMPTOMS/MOOD NEUROLOGICAL BOWEL NUTRITION STATUS      Continent Diet (See DC Summary)  AMBULATORY STATUS COMMUNICATION OF NEEDS Skin   Extensive Assist Verbally Surgical wounds (Left Hip Closed Incision with Silver Hydrofiber)                       Personal Care Assistance Level of Assistance  Bathing, Dressing Bathing Assistance: Maximum assistance   Dressing Assistance: Maximum assistance     Functional Limitations Info             SPECIAL CARE FACTORS FREQUENCY  PT (By licensed PT), OT (By licensed OT)     PT Frequency: 3x week OT Frequency: 2x week            Contractures      Additional Factors Info  Code Status, Allergies, Psychotropic Code Status Info: Full Code Allergies Info: No Known Allergies Psychotropic Info: Zoloft         Current Medications (12/31/2016):  This is the current hospital active medication list Current Facility-Administered Medications  Medication Dose Route Frequency Provider Last Rate Last Dose  . acetaminophen (TYLENOL) tablet 650 mg  650 mg Oral Q6H PRN Lorretta Harp, MD   650 mg at 12/29/16 0355  . amLODipine (NORVASC) tablet 5 mg  5 mg Oral Daily Lorretta Harp, MD   5 mg at 12/31/16 0825  . docusate sodium (COLACE) capsule 100 mg  100 mg Oral BID Samson Frederic, MD   100 mg at 12/31/16 6720  . enoxaparin (LOVENOX) injection 30 mg  30 mg Subcutaneous Q24H Samson Frederic, MD  30 mg at 12/31/16 0826  . feeding supplement (ENSURE ENLIVE) (ENSURE ENLIVE) liquid 237 mL  237 mL Oral BID BM Alekh, Kshitiz, MD   237 mL at 12/31/16 0830  . hydrALAZINE (APRESOLINE) injection 5 mg  5 mg Intravenous Q2H PRN Lorretta Harp, MD      . HYDROcodone-acetaminophen (NORCO/VICODIN) 5-325 MG per tablet 1-2 tablet  1-2 tablet Oral Q6H PRN Samson Frederic, MD   1 tablet at 12/29/16 2116  . lactated ringers infusion   Intravenous Continuous Lowella Curb, MD      . menthol-cetylpyridinium (CEPACOL) lozenge 3 mg  1 lozenge Oral PRN Swinteck,  Arlys John, MD       Or  . phenol (CHLORASEPTIC) mouth spray 1 spray  1 spray Mouth/Throat PRN Swinteck, Arlys John, MD      . methocarbamol (ROBAXIN) tablet 500 mg  500 mg Oral Q8H PRN Lorretta Harp, MD      . metoCLOPramide (REGLAN) tablet 5-10 mg  5-10 mg Oral Q8H PRN Swinteck, Arlys John, MD       Or  . metoCLOPramide (REGLAN) injection 5-10 mg  5-10 mg Intravenous Q8H PRN Swinteck, Arlys John, MD      . morphine 4 MG/ML injection 0.52 mg  0.52 mg Intravenous Q2H PRN Samson Frederic, MD   0.52 mg at 12/29/16 0324  . multivitamin with minerals tablet 1 tablet  1 tablet Oral Daily Lorretta Harp, MD   1 tablet at 12/31/16 [phone]  . ondansetron (ZOFRAN) injection 4 mg  4 mg Intravenous Q8H PRN Lorretta Harp, MD   4 mg at 12/27/16 1027  . oxybutynin (DITROPAN) tablet 5 mg  5 mg Oral Q8H PRN Lorretta Harp, MD      . oxyCODONE-acetaminophen (PERCOCET/ROXICET) 5-325 MG per tablet 1 tablet  1 tablet Oral Q4H PRN Lorretta Harp, MD   1 tablet at 12/27/16 1449  . polyethylene glycol (MIRALAX / GLYCOLAX) packet 17 g  17 g Oral Daily PRN Lorretta Harp, MD      . senna (SENOKOT) tablet 8.6 mg  1 tablet Oral BID Samson Frederic, MD   8.6 mg at 12/31/16 0825  . sertraline (ZOLOFT) tablet 50 mg  50 mg Oral Daily Lorretta Harp, MD   50 mg at 12/31/16 0825  . simvastatin (ZOCOR) tablet 40 mg  40 mg Oral q1800 Lorretta Harp, MD   40 mg at 12/30/16 1720  . vitamin B-12 (CYANOCOBALAMIN) tablet 100 mcg  100 mcg Oral Daily Lorretta Harp, MD   100 mcg at 12/31/16 8469  . vitamin C (ASCORBIC ACID) tablet 500 mg  500 mg Oral Daily Lorretta Harp, MD   500 mg at 12/31/16 0825     Discharge Medications: Please see discharge summary for a list of discharge medications.  Relevant Imaging Results:  Relevant Lab Results:   Additional Information SS#:[id number] LCSW

## 2017-01-01 ENCOUNTER — Encounter: Payer: Self-pay | Admitting: Internal Medicine

## 2017-01-01 ENCOUNTER — Non-Acute Institutional Stay (SKILLED_NURSING_FACILITY): Payer: Medicare Other | Admitting: Internal Medicine

## 2017-01-01 DIAGNOSIS — Z96649 Presence of unspecified artificial hip joint: Secondary | ICD-10-CM

## 2017-01-01 DIAGNOSIS — I6930 Unspecified sequelae of cerebral infarction: Secondary | ICD-10-CM

## 2017-01-01 DIAGNOSIS — I10 Essential (primary) hypertension: Secondary | ICD-10-CM | POA: Diagnosis not present

## 2017-01-01 DIAGNOSIS — S42212A Unspecified displaced fracture of surgical neck of left humerus, initial encounter for closed fracture: Secondary | ICD-10-CM | POA: Diagnosis not present

## 2017-01-01 DIAGNOSIS — D649 Anemia, unspecified: Secondary | ICD-10-CM | POA: Diagnosis not present

## 2017-01-01 NOTE — Progress Notes (Signed)
Provider:  Einar Crow Location:   [facility] Room Number: 143/P Place of Service:  SNF (31)  PCP: [name], MD Patient Care Team: [name], MD as PCP - General (Internal Medicine)  Extended Emergency Contact Information Primary Emergency Contact: Biffle,Terry Address: [address]          [place], [place] [address] [place] Home Phone: [phone] Mobile Phone: [phone] Relation: Son Secondary Emergency Contact: Francetta [place] Mobile Phone: [phone] Relation: Sister  Code Status: Full Code Goals of Care: Advanced Directive information Advanced Directives 01/01/2017  Does Patient Have a Medical Advance Directive? Yes  Type of Advance Directive (No Data)  Does patient want to make changes to medical advance directive? No - Patient declined  Copy of Healthcare Power of Attorney in Chart? -  Would patient like information on creating a medical advance directive? -  Pre-existing out of facility DNR order (yellow form or pink MOST form) -      Chief Complaint  Patient presents with  . New Admit To SNF    New Admission    HPI: Patient is a 83 y.o. female seen today for admission to SNF for therapy after undergoing   She has h/o Hypertension, Hyperlipidemia, S/P CVA, Depression with Anxiety, S/P carotid endarterectomy, and h/o Multiple falls  She was in her Kitchen when she fell. She says she has fallen many times before. Sometimes in yard sometimes in basement. But this time she had pain and was found to have Left femoral neck fracture and Left Humeral neck fracture. She underwent Left Hip Hemiarthroplasty on 08/17. Her post op course was uneventful and she is discharged to SNF for Therapy. She is doing well in facility. She says her pain is controlled. Patient lives alone but son lives near by. She says she is very independent. Walks with the [employer].  Past Medical History:  Diagnosis Date    . Carotid artery disease (HCC)    Status post carotid endarterectomy x2  . Essential hypertension, benign   . Hemiparesis affecting left side as late effect of cerebrovascular accident (HCC) 08/03/2011  . Stroke (HCC) 1997  . Tremor    Past Surgical History:  Procedure Laterality Date  . ANTERIOR APPROACH HEMI HIP ARTHROPLASTY Left 12/28/2016   Procedure: ANTERIOR APPROACH HEMI HIP ARTHROPLASTY;  Surgeon: Samson Frederic, MD;  Location: [facility] OR;  Service: Orthopedics;  Laterality: Left;  . CAROTID ENDARTERECTOMY     Right    reports that she has never smoked. She has never used smokeless tobacco. She reports that she does not drink alcohol or use drugs. Social History   Social History  . Marital status: Widowed    Spouse name: N/A  . Number of children: N/A  . Years of education: N/A   Occupational History  . Not on file.   Social History Main Topics  . Smoking status: Never Smoker  . Smokeless tobacco: Never Used  . Alcohol use No  . Drug use: No  . Sexual activity: Not on file   Other Topics Concern  . Not on file   Social History Narrative  . No narrative on file    Functional Status Survey:    Family History  Problem Relation Age of Onset  . Colon cancer Mother   . Heart disease Mother   . Bladder Cancer Brother   . Cirrhosis Brother        Liver cirrhosis    Health Maintenance  Topic Date Due  . INFLUENZA VACCINE  04/13/2017 (Originally 12/12/2016)  . DEXA SCAN  04/13/2017 (Originally 08/16/1987)  . TETANUS/TDAP  04/13/2017 (Originally 08/15/1939)  . PNA vac Low Risk Adult (1 of 2 - PCV13) 04/13/2017 (Originally 08/16/1987)    Allergies  Allergen Reactions  . No Known Allergies    Outpatient Encounter Prescriptions as of 01/01/2017  Medication Sig  . amLODipine (NORVASC) 5 MG tablet Take 5 mg by mouth daily.  . clopidogrel (PLAVIX) 75 MG tablet Take 75 mg by mouth daily.  Marland Kitchen docusate sodium (COLACE) 100 MG capsule Take 1 capsule (100 mg total) by mouth 2  (two) times daily.  Marland Kitchen ENSURE (ENSURE) Take 237 mLs by mouth 2 (two) times daily between meals.  . hydrochlorothiazide (HYDRODIURIL) 25 MG tablet Take 25 mg by mouth every other day.   . meprobamate (EQUANIL) 400 MG tablet Take 400 mg by mouth 3 (three) times daily as needed for anxiety.   . methocarbamol (ROBAXIN) 500 MG tablet Take 1 tablet (500 mg total) by mouth every 8 (eight) hours as needed for muscle spasms.  . Multiple Vitamins-Minerals (CENTRUM SILVER PO) Take 1 tablet by mouth daily.   Marland Kitchen oxybutynin (DITROPAN) 5 MG tablet Take 5 mg by mouth every 8 (eight) hours as needed for bladder spasms.  Marland Kitchen oxyCODONE-acetaminophen (PERCOCET/ROXICET) 5-325 MG tablet Take 1 tablet by mouth every 4 (four) hours as needed for moderate pain.  . polyethylene glycol (MIRALAX / GLYCOLAX) packet Take 17 g by mouth daily as needed for mild constipation.  . senna (SENOKOT) 8.6 MG TABS tablet Take 1 tablet (8.6 mg total) by mouth 2 (two) times daily.  . sertraline (ZOLOFT) 50 MG tablet Take 50 mg by mouth daily.  . simvastatin (ZOCOR) 40 MG tablet Take 40 mg by mouth daily.  . vitamin B-12 (CYANOCOBALAMIN) 100 MCG tablet Take 100 mcg by mouth daily.  . vitamin C (ASCORBIC ACID) 500 MG tablet Take 500 mg by mouth daily.  . [DISCONTINUED] enoxaparin (LOVENOX) 30 MG/0.3ML injection Inject 0.3 mLs (30 mg total) into the skin daily.   No facility-administered encounter medications on file as of 01/01/2017.      Review of Systems  Review of Systems  Constitutional: Negative for activity change, appetite change, chills, diaphoresis, fatigue and fever.  HENT: Negative for mouth sores, postnasal drip, rhinorrhea, sinus pain and sore throat.   Respiratory: Negative for apnea, cough, chest tightness, shortness of breath and wheezing.   Cardiovascular: Negative for chest pain, palpitations and leg swelling.  Gastrointestinal: Negative for abdominal distention, abdominal pain, constipation, diarrhea, nausea and  vomiting.  Genitourinary: Negative for dysuria and frequency.  Musculoskeletal: Negative for arthralgias, joint swelling and myalgias.  Skin: Negative for rash.  Neurological: Negative for dizziness, syncope, weakness, light-headedness and numbness.  Psychiatric/Behavioral: Negative for behavioral problems, confusion and sleep disturbance.     Vitals:   01/01/17 1026  BP: (!) 154/69  Pulse: 91  Resp: 20  Temp: (!) 97.5 F (36.4 C)  TempSrc: Oral   There is no height or weight on file to calculate BMI. Physical Exam  Constitutional: She is oriented to person, place, and time. She appears well-developed and well-nourished.  HENT:  Head: Normocephalic.  Mouth/Throat: Oropharynx is clear and moist.  Eyes: Pupils are equal, round, and reactive to light.  Neck: Neck supple.  Cardiovascular: Normal rate.   No murmur heard. Pulmonary/Chest: Effort normal and breath sounds normal. No respiratory distress. She has no wheezes. She has no rales.  Abdominal:  Soft. Bowel sounds are normal. She exhibits no distension. There is no tenderness. There is no rebound.  Musculoskeletal: She exhibits no edema.  Neurological: She is alert and oriented to person, place, and time.  Patient had good strength in RUE and LE Could not Check Left side due to Surgery and Sling.  Skin: Skin is warm and dry.  Psychiatric: She has a normal mood and affect. Her behavior is normal. Thought content normal.    Labs reviewed: Basic Metabolic Panel:  Recent Labs  21/30/86 0323 12/30/16 0315 12/31/16 0317  NA 137 139 137  K 3.8 4.1 4.3  CL 104 107 104  CO2 25 27 28   GLUCOSE 105* 115* 96  BUN 23* 28* 27*  CREATININE 0.99 0.98 0.78  CALCIUM 8.7* 9.1 9.0  MG 1.7 1.9 2.0   Liver Function Tests: No results for input(s): AST, ALT, ALKPHOS, BILITOT, PROT, ALBUMIN in the last 8760 hours. No results for input(s): LIPASE, AMYLASE in the last 8760 hours. No results for input(s): AMMONIA in the last 8760  hours. CBC:  Recent Labs  12/03/16 1443 12/27/16 0301 12/28/16 0309  12/29/16 0323 12/30/16 0315 12/31/16 0317  WBC 8.2 20.2* 13.8*  < > 14.5* 15.5* 14.2*  NEUTROABS 6,396 18.7* 11.7*  --   --   --   --   HGB 13.0 13.4 10.9*  < > 9.1* 8.3* 8.3*  HCT 39.2 39.5 33.1*  < > 27.9* 25.1* 24.9*  MCV 94.9 93.6 95.4  < > 94.6 94.4 94.3  PLT 225 196 144*  < > 120* 113* 143*  < > = values in this interval not displayed. Cardiac Enzymes: No results for input(s): CKTOTAL, CKMB, CKMBINDEX, TROPONINI in the last 8760 hours. BNP: Invalid input(s): POCBNP Lab Results  Component Value Date   HGBA1C 5.5 08/03/2011   Lab Results  Component Value Date   TSH 0.662 08/03/2011   No results found for: VITAMINB12 No results found for: FOLATE No results found for: IRON, TIBC, FERRITIN  Imaging and Procedures obtained prior to SNF admission: Ct Head Wo Contrast  Result Date: 12/27/2016 CLINICAL DATA:  Status post fall at home, with concern for head or cervical spine injury. Initial encounter. EXAM: CT HEAD WITHOUT CONTRAST CT CERVICAL SPINE WITHOUT CONTRAST TECHNIQUE: Multidetector CT imaging of the head and cervical spine was performed following the standard protocol without intravenous contrast. Multiplanar CT image reconstructions of the cervical spine were also generated. COMPARISON:  CT of the head performed 12/28/2003 FINDINGS: CT HEAD FINDINGS Brain: No evidence of acute infarction, hemorrhage, hydrocephalus, extra-axial collection or mass lesion/mass effect. Prominence of the ventricles and sulci reflects mild cortical volume loss. Mild cerebellar atrophy is noted. Scattered periventricular white matter change likely reflects small vessel ischemic microangiopathy. The brainstem and fourth ventricle are within normal limits. The basal ganglia are unremarkable in appearance. The cerebral hemispheres demonstrate grossly normal gray-white differentiation. No mass effect or midline shift is seen.  Vascular: No hyperdense vessel or unexpected calcification. Skull: There is no evidence of fracture; visualized osseous structures are unremarkable in appearance. Sinuses/Orbits: The orbits are within normal limits. The paranasal sinuses and mastoid air cells are well-aerated. Other: No significant soft tissue abnormalities are seen. CT CERVICAL SPINE FINDINGS Alignment: Normal. Skull base and vertebrae: No acute fracture. No primary bone lesion or focal pathologic process. Soft tissues and spinal canal: No prevertebral fluid or swelling. No visible canal hematoma. Disc levels: Mild intervertebral disc space narrowing is noted along the lower cervical spine, with  scattered anterior and posterior disc osteophyte complexes. Mild degenerative change is noted about the dens. Upper chest: The visualized lung apices are clear. The thyroid gland is somewhat heterogeneous but otherwise unremarkable. Scattered calcification is noted at the carotid bifurcations bilaterally. There is likely moderate to severe luminal narrowing at the proximal left internal carotid artery. Other: No additional soft tissue abnormalities are seen. IMPRESSION: 1. No evidence of traumatic intracranial injury or fracture. 2. No evidence of fracture or subluxation along the cervical spine. 3. Mild cortical volume loss and scattered small vessel ischemic microangiopathy. 4. Mild degenerative change along the lower cervical spine. 5. Scattered calcification at the carotid bifurcations bilaterally. Likely moderate to severe luminal narrowing at the proximal left internal carotid artery. Carotid ultrasound would be helpful for further evaluation, when and as deemed clinically appropriate. Electronically Signed   By: Roanna Raider M.D.   On: 12/27/2016 02:32   Ct Cervical Spine Wo Contrast  Result Date: 12/27/2016 CLINICAL DATA:  Status post fall at home, with concern for head or cervical spine injury. Initial encounter. EXAM: CT HEAD WITHOUT  CONTRAST CT CERVICAL SPINE WITHOUT CONTRAST TECHNIQUE: Multidetector CT imaging of the head and cervical spine was performed following the standard protocol without intravenous contrast. Multiplanar CT image reconstructions of the cervical spine were also generated. COMPARISON:  CT of the head performed 12/28/2003 FINDINGS: CT HEAD FINDINGS Brain: No evidence of acute infarction, hemorrhage, hydrocephalus, extra-axial collection or mass lesion/mass effect. Prominence of the ventricles and sulci reflects mild cortical volume loss. Mild cerebellar atrophy is noted. Scattered periventricular white matter change likely reflects small vessel ischemic microangiopathy. The brainstem and fourth ventricle are within normal limits. The basal ganglia are unremarkable in appearance. The cerebral hemispheres demonstrate grossly normal gray-white differentiation. No mass effect or midline shift is seen. Vascular: No hyperdense vessel or unexpected calcification. Skull: There is no evidence of fracture; visualized osseous structures are unremarkable in appearance. Sinuses/Orbits: The orbits are within normal limits. The paranasal sinuses and mastoid air cells are well-aerated. Other: No significant soft tissue abnormalities are seen. CT CERVICAL SPINE FINDINGS Alignment: Normal. Skull base and vertebrae: No acute fracture. No primary bone lesion or focal pathologic process. Soft tissues and spinal canal: No prevertebral fluid or swelling. No visible canal hematoma. Disc levels: Mild intervertebral disc space narrowing is noted along the lower cervical spine, with scattered anterior and posterior disc osteophyte complexes. Mild degenerative change is noted about the dens. Upper chest: The visualized lung apices are clear. The thyroid gland is somewhat heterogeneous but otherwise unremarkable. Scattered calcification is noted at the carotid bifurcations bilaterally. There is likely moderate to severe luminal narrowing at the  proximal left internal carotid artery. Other: No additional soft tissue abnormalities are seen. IMPRESSION: 1. No evidence of traumatic intracranial injury or fracture. 2. No evidence of fracture or subluxation along the cervical spine. 3. Mild cortical volume loss and scattered small vessel ischemic microangiopathy. 4. Mild degenerative change along the lower cervical spine. 5. Scattered calcification at the carotid bifurcations bilaterally. Likely moderate to severe luminal narrowing at the proximal left internal carotid artery. Carotid ultrasound would be helpful for further evaluation, when and as deemed clinically appropriate. Electronically Signed   By: Roanna Raider M.D.   On: 12/27/2016 02:32   Pelvis Portable  Result Date: 12/28/2016 CLINICAL DATA:  LEFT hip replacement EXAM: PORTABLE PELVIS 1-2 VIEWS COMPARISON:  Portable exam 1140 hours compared intraoperative images of 12/28/2016 FINDINGS: LEFT hip prosthesis in expected position. Bones demineralized. No fracture  or dislocation. IMPRESSION: LEFT hip prosthesis without acute complication. Electronically Signed   By: Ulyses Southward M.D.   On: 12/28/2016 12:11   Dg Chest Port 1 View  Result Date: 12/27/2016 CLINICAL DATA:  Preoperative chest radiograph for hip and shoulder fractures. Initial encounter. EXAM: PORTABLE CHEST 1 VIEW COMPARISON:  Chest radiograph performed 02/07/2016 FINDINGS: The lungs are well-aerated. Left midlung scarring is noted. There is no evidence of pleural effusion or pneumothorax. The cardiomediastinal silhouette is is mildly enlarged. The masslike density overlying the right hilum reflects ingested material filling the previously noted esophageal diverticulum. The patient's known left humeral neck fracture is partially characterized. No acute displaced rib fractures are seen. Chronic left-sided rib deformities are noted. IMPRESSION: 1. Mild left midlung scarring noted. Lungs otherwise clear. No acute displaced rib fracture  seen. 2. Mild cardiomegaly. 3. Ingested material seen filling the previously noted esophageal diverticulum, overlying the right hilum. 4. Known left humeral neck fracture is partially characterized. Electronically Signed   By: Roanna Raider M.D.   On: 12/27/2016 03:17   Dg Shoulder Left  Result Date: 12/27/2016 CLINICAL DATA:  Fall with pain on the left side EXAM: LEFT SHOULDER - 2+ VIEW COMPARISON:  01/04/2016 FINDINGS: Old distal left clavicle fracture.  Old left upper rib fractures. Acute left humeral neck fracture with less than 1/2 bone with of central displacement on axillary view. No dislocation. IMPRESSION: 1. Diffuse osteopenia. 2. Acute left humeral neck fracture. No dislocation of the humeral head 3. Old left clavicle and rib fractures Electronically Signed   By: Jasmine Pang M.D.   On: 12/27/2016 01:44   Dg C-arm 1-60 Min  Result Date: 12/28/2016 CLINICAL DATA:  Total left anterior hip replacement. Left hip fracture EXAM: DG C-ARM 61-120 MIN; OPERATIVE LEFT HIP WITH PELVIS COMPARISON:  12/27/2016 FINDINGS: C-arm images were obtained in the operating room. Left hip replacement in satisfactory position and alignment. No acute complication IMPRESSION: Satisfactory left hip replacement Electronically Signed   By: Marlan Palau M.D.   On: 12/28/2016 11:37   Dg Hip Operative Unilat With Pelvis Left  Result Date: 12/28/2016 CLINICAL DATA:  Total left anterior hip replacement. Left hip fracture EXAM: DG C-ARM 61-120 MIN; OPERATIVE LEFT HIP WITH PELVIS COMPARISON:  12/27/2016 FINDINGS: C-arm images were obtained in the operating room. Left hip replacement in satisfactory position and alignment. No acute complication IMPRESSION: Satisfactory left hip replacement Electronically Signed   By: Marlan Palau M.D.   On: 12/28/2016 11:37   Dg Hip Unilat With Pelvis 2-3 Views Left  Result Date: 12/27/2016 CLINICAL DATA:  Status post fall on left side, with left hip pain. Initial encounter. EXAM: DG HIP  (WITH OR WITHOUT PELVIS) 2-3V LEFT COMPARISON:  None. FINDINGS: There is a mildly displaced subcapital fracture through the left femoral neck. The left femoral head remains seated at the acetabulum. No additional fractures are seen. The right hip joint is unremarkable in appearance. Mild degenerative change is noted at the lower lumbar spine. The sacroiliac joints are grossly unremarkable. The visualized bowel gas pattern is grossly unremarkable. Scattered vascular calcifications are seen. IMPRESSION: Mildly displaced subcapital fracture through the left femoral neck. Electronically Signed   By: Roanna Raider M.D.   On: 12/27/2016 01:49    Assessment/Plan  S/P left hip hemiarthroplasty Patient doing well with Therapy.  She will continue on Lovenox till she follows with Ortho probably for 3 weeks. Continue Norco and robaxin.  Fx humeral neck, left, She is in Sling Follow with Ortho.  Therapy is going to be challenging.  Essential hypertension BP mildly elevated. Continue on HCTZ and Amlodipine.  Personal history of stroke with residual effects She is on Plavix and suppose to have slight Left sided weakness. Will see how she does with Therapy. She is also on simvastatin  Anemia and Thrombocytopenia Most likely Post op. Will start her on iron supplement Follow up CBC on Lovenox.  Depression with Anxiety On Zoloft and Meprobamate PRN  Hyperlipidemia On Simvastatin  Multiple falls It seems patient has some gait issues and is high risk for falling. D/W about  Assisted living but she adamantly refuses. Wants to go home once she is weight Bearing.       Family/ staff Communication:   Labs/tests ordered:CBC,BMP  Total time spent in this patient care encounter was 45_ minutes; greater than 50% of the visit spent counseling patient, reviewing records , Labs and coordinating care for problems addressed at this encounter.

## 2017-01-02 ENCOUNTER — Ambulatory Visit: Payer: Medicare Other | Admitting: Urology

## 2017-01-03 ENCOUNTER — Encounter (HOSPITAL_COMMUNITY)
Admission: RE | Admit: 2017-01-03 | Discharge: 2017-01-03 | Disposition: A | Discharge status: Home / Self Care | Payer: Medicare Other | Source: Skilled Nursing Facility | Attending: Internal Medicine | Admitting: Internal Medicine

## 2017-01-03 ENCOUNTER — Non-Acute Institutional Stay (SKILLED_NURSING_FACILITY): Payer: Medicare Other | Admitting: Internal Medicine

## 2017-01-03 ENCOUNTER — Encounter: Payer: Self-pay | Admitting: Internal Medicine

## 2017-01-03 DIAGNOSIS — E785 Hyperlipidemia, unspecified: Secondary | ICD-10-CM | POA: Insufficient documentation

## 2017-01-03 DIAGNOSIS — Z96649 Presence of unspecified artificial hip joint: Secondary | ICD-10-CM | POA: Diagnosis not present

## 2017-01-03 DIAGNOSIS — Z4789 Encounter for other orthopedic aftercare: Secondary | ICD-10-CM | POA: Insufficient documentation

## 2017-01-03 DIAGNOSIS — R42 Dizziness and giddiness: Secondary | ICD-10-CM | POA: Diagnosis not present

## 2017-01-03 DIAGNOSIS — I10 Essential (primary) hypertension: Secondary | ICD-10-CM

## 2017-01-03 DIAGNOSIS — S72012D Unspecified intracapsular fracture of left femur, subsequent encounter for closed fracture with routine healing: Secondary | ICD-10-CM | POA: Insufficient documentation

## 2017-01-03 DIAGNOSIS — Z9181 History of falling: Secondary | ICD-10-CM | POA: Insufficient documentation

## 2017-01-03 DIAGNOSIS — D509 Iron deficiency anemia, unspecified: Secondary | ICD-10-CM | POA: Diagnosis not present

## 2017-01-03 LAB — CBC WITH DIFFERENTIAL/PLATELET
BASOS PCT: 0 %
Basophils Absolute: 0 10*3/uL (ref 0.0–0.1)
EOS ABS: 0.7 10*3/uL (ref 0.0–0.7)
Eosinophils Relative: 6 %
HEMATOCRIT: 29.8 % — AB (ref 36.0–46.0)
HEMOGLOBIN: 9.8 g/dL — AB (ref 12.0–15.0)
LYMPHS ABS: 1.2 10*3/uL (ref 0.7–4.0)
Lymphocytes Relative: 11 %
MCH: 31.7 pg (ref 26.0–34.0)
MCHC: 32.9 g/dL (ref 30.0–36.0)
MCV: 96.4 fL (ref 78.0–100.0)
Monocytes Absolute: 1.3 10*3/uL — ABNORMAL HIGH (ref 0.1–1.0)
Monocytes Relative: 11 %
NEUTROS ABS: 8.5 10*3/uL — AB (ref 1.7–7.7)
NEUTROS PCT: 72 %
Platelets: 311 10*3/uL (ref 150–400)
RBC: 3.09 MIL/uL — AB (ref 3.87–5.11)
RDW: 13.8 % (ref 11.5–15.5)
WBC: 11.7 10*3/uL — AB (ref 4.0–10.5)

## 2017-01-03 LAB — COMPREHENSIVE METABOLIC PANEL
ALBUMIN: 2.7 g/dL — AB (ref 3.5–5.0)
ALK PHOS: 100 U/L (ref 38–126)
ALT: 32 U/L (ref 14–54)
ANION GAP: 7 (ref 5–15)
AST: 47 U/L — ABNORMAL HIGH (ref 15–41)
BILIRUBIN TOTAL: 0.7 mg/dL (ref 0.3–1.2)
BUN: 24 mg/dL — ABNORMAL HIGH (ref 6–20)
CALCIUM: 9.8 mg/dL (ref 8.9–10.3)
CO2: 27 mmol/L (ref 22–32)
CREATININE: 0.8 mg/dL (ref 0.44–1.00)
Chloride: 102 mmol/L (ref 101–111)
GFR calc non Af Amer: >60 mL/min (ref >60)
GLUCOSE: 99 mg/dL (ref 65–99)
Potassium: 3.7 mmol/L (ref 3.5–5.1)
SODIUM: 136 mmol/L (ref 135–145)
TOTAL PROTEIN: 6 g/dL — AB (ref 6.5–8.1)

## 2017-01-03 NOTE — Progress Notes (Signed)
Location:   [facility] Room Number: 143/P Place of Service:  SNF [id number]) Provider:  Jacelyn Pi, MD  Patient Care Team: [name], MD as PCP - General (Internal Medicine)  Extended Emergency Contact Information Primary Emergency Contact: Hunnell,Terry Address: [address]          [place], [place] [address] [place] Home Phone: [phone] Mobile Phone: [phone] Relation: Son Secondary Emergency Contact: Francetta [place] Mobile Phone: [phone] Relation: Sister  Code Status:  Full Code Goals of care: Advanced Directive information Advanced Directives 01/03/2017  Does Patient Have a Medical Advance Directive? Yes  Type of Advance Directive (No Data)  Does patient want to make changes to medical advance directive? No - Patient declined  Copy of Healthcare Power of Attorney in Chart? -  Would patient like information on creating a medical advance directive? -  Pre-existing out of facility DNR order (yellow form or pink MOST form) -     Chief Complaint  Patient presents with  . Acute Visit    Hypertension and Dizzness    HPI:  Pt is a 83 y.o. female seen today for an acute visit for Follow-up of hypertension and dizziness.  She has h/o Hypertension, Hyperlipidemia, S/P CVA, Depression with Anxiety, S/P carotid endarterectomy, and h/o Multiple falls  He is here for rehabilitation after sustaining a fall with a left femoral neck fracture and left humeral fracture-she did have the hip fracture surgically repaired.  She is doing relatively well however as been noted she is complaining of some dizziness and blood pressures have shown significant hypotension.  She is on hydrochlorothiazide every other day as well as Norvasc every day blood pressure lying was 112/46 today-however standing apparently dropped to 96/51-82/40.  Apparently these were taken during therapy.  does not complaining  any chest pain shortness of breath currently she is lying comfortably and has no complaints.        Past Medical History:  Diagnosis Date  . Carotid artery disease (HCC)    Status post carotid endarterectomy x2  . Essential hypertension, benign   . Hemiparesis affecting left side as late effect of cerebrovascular accident (HCC) 08/03/2011  . Stroke (HCC) 1997  . Tremor    Past Surgical History:  Procedure Laterality Date  . ANTERIOR APPROACH HEMI HIP ARTHROPLASTY Left 12/28/2016   Procedure: ANTERIOR APPROACH HEMI HIP ARTHROPLASTY;  Surgeon: Samson Frederic, MD;  Location: [facility] OR;  Service: Orthopedics;  Laterality: Left;  . CAROTID ENDARTERECTOMY     Right    Allergies  Allergen Reactions  . No Known Allergies     Outpatient Encounter Prescriptions as of 01/03/2017  Medication Sig  . amLODipine (NORVASC) 5 MG tablet Take 5 mg by mouth daily.  Lucilla Lame [place]-Castor Oil (VENELEX) OINT Apply to sacrum and bilateral buttocks q shift and prn for prevention.  . clopidogrel (PLAVIX) 75 MG tablet Take 75 mg by mouth daily.  Marland Kitchen docusate sodium (COLACE) 100 MG capsule Take 1 capsule (100 mg total) by mouth 2 (two) times daily.  Marland Kitchen enoxaparin (LOVENOX) 30 MG/0.3ML injection Inject 30 mg into the skin daily.  Marland Kitchen ENSURE (ENSURE) Take 237 mLs by mouth 2 (two) times daily between meals.  . hydrochlorothiazide (HYDRODIURIL) 25 MG tablet Take 25 mg by mouth every other day.   . meprobamate (EQUANIL) 400 MG tablet Take 400 mg by mouth 3 (three) times daily as needed for anxiety.   . methocarbamol (  ROBAXIN) 500 MG tablet Take 1 tablet (500 mg total) by mouth every 8 (eight) hours as needed for muscle spasms.  . Multiple Vitamins-Minerals (CENTRUM SILVER PO) Take 1 tablet by mouth daily.   Marland Kitchen oxybutynin (DITROPAN) 5 MG tablet Take 5 mg by mouth every 8 (eight) hours as needed for bladder spasms.  Marland Kitchen oxyCODONE-acetaminophen (PERCOCET/ROXICET) 5-325 MG tablet Take 1 tablet by mouth every 4 (four) hours  as needed for moderate pain.  . polyethylene glycol (MIRALAX / GLYCOLAX) packet Take 17 g by mouth daily as needed for mild constipation.  . senna (SENOKOT) 8.6 MG TABS tablet Take 1 tablet (8.6 mg total) by mouth 2 (two) times daily.  . sertraline (ZOLOFT) 50 MG tablet Take 50 mg by mouth daily.  . simvastatin (ZOCOR) 40 MG tablet Take 40 mg by mouth daily.  . vitamin B-12 (CYANOCOBALAMIN) 100 MCG tablet Take 100 mcg by mouth daily.  . vitamin C (ASCORBIC ACID) 500 MG tablet Take 500 mg by mouth daily.   No facility-administered encounter medications on file as of 01/03/2017.     Review of Systems  General she does not complaining of any fever or chills.  Skin does not complaining of diaphoresis or rashes.  Head ears eyes nose mouth and throat does not complaining of any visual changes or sore throat.  Respiratory denies shortness breath or cough.  Cardiac denies chest pain does not have significant lower extremity edema.  GI does not complain of any nausea vomiting or abdominal pain.  Muscle skeletal does not complain of joint pain or hip pain at this time.  Neurologic does not complain of numbness headache or syncope does complain of dizziness which appears to be orthostatic related.  Psych does not complain of overt anxiety or depression he continues to be very pleasant talkative.     There is no immunization history on file for this patient. Pertinent  Health Maintenance Due  Topic Date Due  . INFLUENZA VACCINE  04/13/2017 (Originally 12/12/2016)  . DEXA SCAN  04/13/2017 (Originally 08/16/1987)  . PNA vac Low Risk Adult (1 of 2 - PCV13) 04/13/2017 (Originally 08/16/1987)   No flowsheet data found. Functional Status Survey:    Vitals:   01/03/17 1430 01/03/17 1431 01/03/17 1432 01/03/17 1433  BP: (!) 96/51 (!) 82/40 (!) 102/42 (!) 112/46  Pulse: 76     Resp: 20     Temp: 98.2 F (36.8 C)     TempSrc: Oral     Again blood pressure lying 102/42-in 112/56 after lying  down for additional 10 minutes. Did take it manually this afternoon when she was lying down and got 120/62   Physical Exam   In general this is a pleasant alert elderly female in no distress lying comfortably in bed.  Her skin is warm and dry.  Eyes pupils appear reactive light sclera and conjunctiva clear visual acuity appears grossly intact.  Oropharynx is clear mucous membranes moist.    Chest is clear to auscultation there is no labored breathing.  Heart is regular rate and rhythm without murmur gallop or rub she does not have significant lower extremity edema.  Her abdomen is soft nontender with positive bowel sounds.  Muscle skeletal has her left arm in a sling capillary refill is intact grip strength appears to be intact radial pulse is intact on the left-she does have a history of left hip fracture again with limited movement of her left leg otherwise strength appears to be intact other extremities.  Neurologic is grossly intact no lateralizing findings her speech is clear cranial nerves intact strength intact.  Psych she appears grossly alert and oriented pleasant and appropriate very talkative    Labs reviewed:  Recent Labs  12/29/16 0323 12/30/16 0315 12/31/16 0317 01/03/17 0737  NA 137 139 137 136  K 3.8 4.1 4.3 3.7  CL 104 107 104 102  CO2 25 27 28 27   GLUCOSE 105* 115* 96 99  BUN 23* 28* 27* 24*  CREATININE 0.99 0.98 0.78 0.80  CALCIUM 8.7* 9.1 9.0 9.8  MG 1.7 1.9 2.0  --     Recent Labs  01/03/17 0737  AST 47*  ALT 32  ALKPHOS 100  BILITOT 0.7  PROT 6.0*  ALBUMIN 2.7*    Recent Labs  12/27/16 0301 12/28/16 0309  12/30/16 0315 12/31/16 0317 01/03/17 0737  WBC 20.2* 13.8*  < > 15.5* 14.2* 11.7*  NEUTROABS 18.7* 11.7*  --   --   --  8.5*  HGB 13.4 10.9*  < > 8.3* 8.3* 9.8*  HCT 39.5 33.1*  < > 25.1* 24.9* 29.8*  MCV 93.6 95.4  < > 94.4 94.3 96.4  PLT 196 144*  < > 113* 143* 311  < > = values in this interval not displayed. Lab  Results  Component Value Date   TSH 0.662 08/03/2011   Lab Results  Component Value Date   HGBA1C 5.5 08/03/2011   Lab Results  Component Value Date   CHOL 155 01/26/2012   HDL 51 01/26/2012   LDLCALC 90 01/26/2012   TRIG 72 01/26/2012   CHOLHDL 3.0 01/26/2012    Significant Diagnostic Results in last 30 days:  Ct Head Wo Contrast  Result Date: 12/27/2016 CLINICAL DATA:  Status post fall at home, with concern for head or cervical spine injury. Initial encounter. EXAM: CT HEAD WITHOUT CONTRAST CT CERVICAL SPINE WITHOUT CONTRAST TECHNIQUE: Multidetector CT imaging of the head and cervical spine was performed following the standard protocol without intravenous contrast. Multiplanar CT image reconstructions of the cervical spine were also generated. COMPARISON:  CT of the head performed 12/28/2003 FINDINGS: CT HEAD FINDINGS Brain: No evidence of acute infarction, hemorrhage, hydrocephalus, extra-axial collection or mass lesion/mass effect. Prominence of the ventricles and sulci reflects mild cortical volume loss. Mild cerebellar atrophy is noted. Scattered periventricular white matter change likely reflects small vessel ischemic microangiopathy. The brainstem and fourth ventricle are within normal limits. The basal ganglia are unremarkable in appearance. The cerebral hemispheres demonstrate grossly normal gray-white differentiation. No mass effect or midline shift is seen. Vascular: No hyperdense vessel or unexpected calcification. Skull: There is no evidence of fracture; visualized osseous structures are unremarkable in appearance. Sinuses/Orbits: The orbits are within normal limits. The paranasal sinuses and mastoid air cells are well-aerated. Other: No significant soft tissue abnormalities are seen. CT CERVICAL SPINE FINDINGS Alignment: Normal. Skull base and vertebrae: No acute fracture. No primary bone lesion or focal pathologic process. Soft tissues and spinal canal: No prevertebral fluid or  swelling. No visible canal hematoma. Disc levels: Mild intervertebral disc space narrowing is noted along the lower cervical spine, with scattered anterior and posterior disc osteophyte complexes. Mild degenerative change is noted about the dens. Upper chest: The visualized lung apices are clear. The thyroid gland is somewhat heterogeneous but otherwise unremarkable. Scattered calcification is noted at the carotid bifurcations bilaterally. There is likely moderate to severe luminal narrowing at the proximal left internal carotid artery. Other: No additional soft tissue abnormalities are seen. IMPRESSION:  1. No evidence of traumatic intracranial injury or fracture. 2. No evidence of fracture or subluxation along the cervical spine. 3. Mild cortical volume loss and scattered small vessel ischemic microangiopathy. 4. Mild degenerative change along the lower cervical spine. 5. Scattered calcification at the carotid bifurcations bilaterally. Likely moderate to severe luminal narrowing at the proximal left internal carotid artery. Carotid ultrasound would be helpful for further evaluation, when and as deemed clinically appropriate. Electronically Signed   By: Roanna Raider M.D.   On: 12/27/2016 02:32   Ct Cervical Spine Wo Contrast  Result Date: 12/27/2016 CLINICAL DATA:  Status post fall at home, with concern for head or cervical spine injury. Initial encounter. EXAM: CT HEAD WITHOUT CONTRAST CT CERVICAL SPINE WITHOUT CONTRAST TECHNIQUE: Multidetector CT imaging of the head and cervical spine was performed following the standard protocol without intravenous contrast. Multiplanar CT image reconstructions of the cervical spine were also generated. COMPARISON:  CT of the head performed 12/28/2003 FINDINGS: CT HEAD FINDINGS Brain: No evidence of acute infarction, hemorrhage, hydrocephalus, extra-axial collection or mass lesion/mass effect. Prominence of the ventricles and sulci reflects mild cortical volume loss. Mild  cerebellar atrophy is noted. Scattered periventricular white matter change likely reflects small vessel ischemic microangiopathy. The brainstem and fourth ventricle are within normal limits. The basal ganglia are unremarkable in appearance. The cerebral hemispheres demonstrate grossly normal gray-white differentiation. No mass effect or midline shift is seen. Vascular: No hyperdense vessel or unexpected calcification. Skull: There is no evidence of fracture; visualized osseous structures are unremarkable in appearance. Sinuses/Orbits: The orbits are within normal limits. The paranasal sinuses and mastoid air cells are well-aerated. Other: No significant soft tissue abnormalities are seen. CT CERVICAL SPINE FINDINGS Alignment: Normal. Skull base and vertebrae: No acute fracture. No primary bone lesion or focal pathologic process. Soft tissues and spinal canal: No prevertebral fluid or swelling. No visible canal hematoma. Disc levels: Mild intervertebral disc space narrowing is noted along the lower cervical spine, with scattered anterior and posterior disc osteophyte complexes. Mild degenerative change is noted about the dens. Upper chest: The visualized lung apices are clear. The thyroid gland is somewhat heterogeneous but otherwise unremarkable. Scattered calcification is noted at the carotid bifurcations bilaterally. There is likely moderate to severe luminal narrowing at the proximal left internal carotid artery. Other: No additional soft tissue abnormalities are seen. IMPRESSION: 1. No evidence of traumatic intracranial injury or fracture. 2. No evidence of fracture or subluxation along the cervical spine. 3. Mild cortical volume loss and scattered small vessel ischemic microangiopathy. 4. Mild degenerative change along the lower cervical spine. 5. Scattered calcification at the carotid bifurcations bilaterally. Likely moderate to severe luminal narrowing at the proximal left internal carotid artery. Carotid  ultrasound would be helpful for further evaluation, when and as deemed clinically appropriate. Electronically Signed   By: Roanna Raider M.D.   On: 12/27/2016 02:32   Pelvis Portable  Result Date: 12/28/2016 CLINICAL DATA:  LEFT hip replacement EXAM: PORTABLE PELVIS 1-2 VIEWS COMPARISON:  Portable exam 1140 hours compared intraoperative images of 12/28/2016 FINDINGS: LEFT hip prosthesis in expected position. Bones demineralized. No fracture or dislocation. IMPRESSION: LEFT hip prosthesis without acute complication. Electronically Signed   By: Ulyses Southward M.D.   On: 12/28/2016 12:11   Dg Chest Port 1 View  Result Date: 12/27/2016 CLINICAL DATA:  Preoperative chest radiograph for hip and shoulder fractures. Initial encounter. EXAM: PORTABLE CHEST 1 VIEW COMPARISON:  Chest radiograph performed 02/07/2016 FINDINGS: The lungs are well-aerated. Left midlung  scarring is noted. There is no evidence of pleural effusion or pneumothorax. The cardiomediastinal silhouette is is mildly enlarged. The masslike density overlying the right hilum reflects ingested material filling the previously noted esophageal diverticulum. The patient's known left humeral neck fracture is partially characterized. No acute displaced rib fractures are seen. Chronic left-sided rib deformities are noted. IMPRESSION: 1. Mild left midlung scarring noted. Lungs otherwise clear. No acute displaced rib fracture seen. 2. Mild cardiomegaly. 3. Ingested material seen filling the previously noted esophageal diverticulum, overlying the right hilum. 4. Known left humeral neck fracture is partially characterized. Electronically Signed   By: Roanna Raider M.D.   On: 12/27/2016 03:17   Dg Shoulder Left  Result Date: 12/27/2016 CLINICAL DATA:  Fall with pain on the left side EXAM: LEFT SHOULDER - 2+ VIEW COMPARISON:  01/04/2016 FINDINGS: Old distal left clavicle fracture.  Old left upper rib fractures. Acute left humeral neck fracture with less than 1/2  bone with of central displacement on axillary view. No dislocation. IMPRESSION: 1. Diffuse osteopenia. 2. Acute left humeral neck fracture. No dislocation of the humeral head 3. Old left clavicle and rib fractures Electronically Signed   By: Jasmine Pang M.D.   On: 12/27/2016 01:44   Dg C-arm 1-60 Min  Result Date: 12/28/2016 CLINICAL DATA:  Total left anterior hip replacement. Left hip fracture EXAM: DG C-ARM 61-120 MIN; OPERATIVE LEFT HIP WITH PELVIS COMPARISON:  12/27/2016 FINDINGS: C-arm images were obtained in the operating room. Left hip replacement in satisfactory position and alignment. No acute complication IMPRESSION: Satisfactory left hip replacement Electronically Signed   By: Marlan Palau M.D.   On: 12/28/2016 11:37   Dg Hip Operative Unilat With Pelvis Left  Result Date: 12/28/2016 CLINICAL DATA:  Total left anterior hip replacement. Left hip fracture EXAM: DG C-ARM 61-120 MIN; OPERATIVE LEFT HIP WITH PELVIS COMPARISON:  12/27/2016 FINDINGS: C-arm images were obtained in the operating room. Left hip replacement in satisfactory position and alignment. No acute complication IMPRESSION: Satisfactory left hip replacement Electronically Signed   By: Marlan Palau M.D.   On: 12/28/2016 11:37   Dg Hip Unilat With Pelvis 2-3 Views Left  Result Date: 12/27/2016 CLINICAL DATA:  Status post fall on left side, with left hip pain. Initial encounter. EXAM: DG HIP (WITH OR WITHOUT PELVIS) 2-3V LEFT COMPARISON:  None. FINDINGS: There is a mildly displaced subcapital fracture through the left femoral neck. The left femoral head remains seated at the acetabulum. No additional fractures are seen. The right hip joint is unremarkable in appearance. Mild degenerative change is noted at the lower lumbar spine. The sacroiliac joints are grossly unremarkable. The visualized bowel gas pattern is grossly unremarkable. Scattered vascular calcifications are seen. IMPRESSION: Mildly displaced subcapital fracture  through the left femoral neck. Electronically Signed   By: Roanna Raider M.D.   On: 12/27/2016 01:49    Assessment/Plan Hypertension-hypotension-dizziness-at this point clinically it appears the dizziness is most likely orthostatic hypotension-this was discussed with Dr. Chales Abrahams and we will for now hold the hydrochlorothiazide which she receives every other day as well as Norvasc and check blood pressures every shift with pulse-with follow-up as needed by provider.  Clinically she appears to be stable again blood pressures lying appear to be within normal range.  She does not complaining of any headache syncope chest pain or shortness of breath.  #2 history of anemia and thrombocytopenia she is on iron on lab done today that shows improvement with a hemoglobin of 9.8-platelets are 311,000  which appear to be improved as well.  #3 history of left femoral fracture with repair she appears to be doing relatively well with this continues on Lovenox-she does have orthopedic follow-up is receiving Percocet as needed for pain. She also continues on when necessary Robaxin  #4 history of left humeral fracture again being treated conservatively with a sling at this point pain appears to be controlled  [id number]

## 2017-01-11 DIAGNOSIS — S42292D Other displaced fracture of upper end of left humerus, subsequent encounter for fracture with routine healing: Secondary | ICD-10-CM | POA: Diagnosis not present

## 2017-01-11 DIAGNOSIS — S72042D Displaced fracture of base of neck of left femur, subsequent encounter for closed fracture with routine healing: Secondary | ICD-10-CM | POA: Diagnosis not present

## 2017-01-16 ENCOUNTER — Non-Acute Institutional Stay (SKILLED_NURSING_FACILITY): Payer: Medicare Other | Admitting: Internal Medicine

## 2017-01-16 DIAGNOSIS — S42212A Unspecified displaced fracture of surgical neck of left humerus, initial encounter for closed fracture: Secondary | ICD-10-CM

## 2017-01-16 DIAGNOSIS — D509 Iron deficiency anemia, unspecified: Secondary | ICD-10-CM

## 2017-01-16 DIAGNOSIS — I10 Essential (primary) hypertension: Secondary | ICD-10-CM

## 2017-01-16 DIAGNOSIS — S72002A Fracture of unspecified part of neck of left femur, initial encounter for closed fracture: Secondary | ICD-10-CM | POA: Diagnosis not present

## 2017-01-16 DIAGNOSIS — F418 Other specified anxiety disorders: Secondary | ICD-10-CM | POA: Diagnosis not present

## 2017-01-16 NOTE — Progress Notes (Signed)
This is a discharge note.  Level care skilled.  Facility is [facility].  Chief complaint-discharge note.  History of present illness.  Patient is a pleasant 83 year old female who sustained a fall at home and had a left humeral neck fracture as well as a left femoral neck fracture.  The hip fracture was surgically repaired-with decision for conservative medical management of her humerus fracture with a sling-she is followed by orthopedics.  Her postoperative course has been fairly unremarkable she did have some anemia as well as thrombocytopenia thought to be surgery related both of these have improved hemoglobin was 9.8 on lab done on August 23 platelets had normalized at 311,000.  She did have some complaints of dizziness thought to be orthostatic in her hydrochlorothiazide as well as Norvasc were discontinued-we have been monitoring her blood pressure systolics appear to show some variability averaging from the 130s to 160s I got 150/58 today manually this will warrant follow up by primary care provider at this point would be hesitant to change any medications right before discharge.  She will be going to an assisted living facility.  Her other medical issues appear to be stable she does have a history of CVA with mild left-sided weakness she is on a statin as well as Plavix.  She also has a history of depression continues on Zoloft appears to be in good spirits with a positive attitude.  She is looking forward to going to assisted living she has no complaints today says her pain is controlled she does receive Percocet as needed.  She also has Robaxin when necessary.  Past Medical History:  Diagnosis Date  . Carotid artery disease (HCC)    Status post carotid endarterectomy x2  . Essential hypertension, benign   . Hemiparesis affecting left side as late effect of cerebrovascular accident (HCC) 08/03/2011  . Stroke (HCC) 1997  . Tremor         Past Surgical History:    Procedure Laterality Date  . ANTERIOR APPROACH HEMI HIP ARTHROPLASTY Left 12/28/2016   Procedure: ANTERIOR APPROACH HEMI HIP ARTHROPLASTY;  Surgeon: Samson Frederic, MD;  Location: [facility] OR;  Service: Orthopedics;  Laterality: Left;  . CAROTID ENDARTERECTOMY     Right        Allergies  Allergen Reactions  . No Known Allergies              . Balsam [place]-Castor Oil (VENELEX) OINT Apply to sacrum and bilateral buttocks q shift and prn for prevention.  . clopidogrel (PLAVIX) 75 MG tablet Take 75 mg by mouth daily.  Marland Kitchen docusate sodium (COLACE) 100 MG capsule Take 1 capsule (100 mg total) by mouth 2 (two) times daily.  Marland Kitchen enoxaparin (LOVENOX) 30 MG/0.3ML injection Inject 30 mg into the skin daily.  Marland Kitchen ENSURE (ENSURE) Take 237 mLs by mouth 2 (two) times daily between meals.  .     . meprobamate (EQUANIL) 400 MG tablet Take 400 mg by mouth 3 (three) times daily as needed for anxiety.   . methocarbamol (ROBAXIN) 500 MG tablet Take 1 tablet (500 mg total) by mouth every 8 (eight) hours as needed for muscle spasms.  . Multiple Vitamins-Minerals (CENTRUM SILVER PO) Take 1 tablet by mouth daily.   Marland Kitchen oxybutynin (DITROPAN) 5 MG tablet Take 5 mg by mouth every 8 (eight) hours as needed for bladder spasms.  Marland Kitchen oxyCODONE-acetaminophen (PERCOCET/ROXICET) 5-325 MG tablet Take 1 tablet by mouth every 4 (four) hours as needed for moderate pain.  . polyethylene  glycol (MIRALAX / GLYCOLAX) packet Take 17 g by mouth daily as needed for mild constipation.  . senna (SENOKOT) 8.6 MG TABS tablet Take 1 tablet (8.6 mg total) by mouth 2 (two) times daily.  . sertraline (ZOLOFT) 50 MG tablet Take 50 mg by mouth daily.  . simvastatin (ZOCOR) 40 MG tablet Take 40 mg by mouth daily.  . vitamin B-12 (CYANOCOBALAMIN) 100 MCG tablet Take 100 mcg by mouth daily.  . vitamin C (ASCORBIC ACID) 500 MG tablet Take 500 mg by mouth daily.          Review of Systems  General she does not complaining of any fever  or chillsweight appears relatively stable.  Skin does not complaining of diaphoresis or rashes.  Head ears eyes nose mouth and throat does not complaining of any visual changes or sore throat.  Respiratory denies shortness breath or cough.  Cardiac denies chest pain does not have significant lower extremity edema.  GI does not complain of any nausea vomiting Constipation or diarrhea or abdominal pain.  Muscle skeletal does not complain of joint pain or hip pain at this time.  Neurologic does not complain of numbness headache or syncope Dizziness has resolved per patient   Psych depression and psych has a history of depression and anxiety but this is been well controlled appears to be in good spirits and looking forward to going to assisted living.     Pertinent  Health Maintenance Due  Topic Date Due  . INFLUENZA VACCINE  04/13/2017 (Originally 12/12/2016)  . DEXA SCAN  04/13/2017 (Originally 08/16/1987)  . PNA vac Low Risk Adult (1 of 2 - PCV13) 04/13/2017 (Originally 08/16/1987)   No flowsheet data found. Functional Status Survey:        Vitals:   She is afebrile pulse of 172 respirations 18 blood pressure taken manually 150/58 variability is noted above-weight is 131.6    Physical Exam   In general this is a pleasant alert elderly female in no distress lying comfortably in bed.--She is bright and alert    Her skin is warm and dry.--Surgical site on the left hip has well-healed crusting without sign of infection --and he does have what appears to be some old bruising left upper arm this is a pale brawny appearing  Eyes pupils appear reactive light sclera and conjunctiva clear visual acuity appears grossly intact.  Oropharynx is clear mucous membranes moist.    Chest is clear to auscultation there is no labored breathing. somewhat shallow air entry   Heart is regular rate and rhythm without murmur gallop or rub she does not have significant lower  extremity edema.  Her abdomen is soft nontender with positive bowel sounds.  Muscle skeletal wears her left arm in a sling  capillary refill is intact grip strength appears to be intact radial pulse is intact on the left-she does have a history of left hip fracture again with limited movement of her left leg otherwise strength appears to be intact other extremities.  Neurologic is grossly intact no lateralizing findings her speech is clear cranial nerves intact strength intact.  Psych she appears grossly alert and oriented pleasant and appropriate very talkativeand glad into be  going to assisted living     Labs reviewed:      Recent Labs  12/29/16 0323 12/30/16 0315 12/31/16 0317 01/03/17 0737  NA 137 139 137 136  K 3.8 4.1 4.3 3.7  CL 104 107 104 102  CO2 25 27 28  27  GLUCOSE 105* 115* 96 99  BUN 23* 28* 27* 24*  CREATININE 0.99 0.98 0.78 0.80  CALCIUM 8.7* 9.1 9.0 9.8  MG 1.7 1.9 2.0  --       RecentLabs(withinlast365days)   Recent Labs  01/03/17 0737  AST 47*  ALT 32  ALKPHOS 100  BILITOT 0.7  PROT 6.0*  ALBUMIN 2.7*      RecentLabs(withinlast365days)   Recent Labs  12/27/16 0301 12/28/16 0309  12/30/16 0315 12/31/16 0317 01/03/17 0737  WBC 20.2* 13.8*  < > 15.5* 14.2* 11.7*  NEUTROABS 18.7* 11.7*  --   --   --  8.5*  HGB 13.4 10.9*  < > 8.3* 8.3* 9.8*  HCT 39.5 33.1*  < > 25.1* 24.9* 29.8*  MCV 93.6 95.4  < > 94.4 94.3 96.4  PLT 196 144*  < > 113* 143* 311  < > = values in this interval not displayed.   RecentLabs       Lab Results  Component Value Date   TSH 0.662 08/03/2011     RecentLabs       Lab Results  Component Value Date   HGBA1C 5.5 08/03/2011     RecentLabs       Lab Results  Component Value Date   CHOL 155 01/26/2012   HDL 51 01/26/2012   LDLCALC 90 01/26/2012   TRIG 72 01/26/2012   CHOLHDL 3.0 01/26/2012        #1 history of left femoral neck fracture-status post  repair-she appears to be stable in this regards has worked with therapy-she will need continued PT and OT for strengthening again her ambulation has been limited by the left humeral fracture as well.  She receives Percocet as needed for pain and Robaxin she has orthopedic follow-up scheduled at this point continues on Lovenox for DVT prophylaxis-.  #2 history of left humeral neck fracture again managed conservatively with a sling this appears to be relatively stable will have orthopedic follow-up pe  Patient r pain is controlled.  #3 history of hypertension-hypotension-this has been somewhat of an issue during her stay here as noted above her Norvasc and hydrochlorothiazide have been discontinued-systolic blood pressure runs on the higher end of normal at times with ranges from her 1:30 to 170s-will defer to primary care provider to possibly reinitiate blood pressure medications again this has been challenging with her history of hypotension and dizziness would be hesitant to change medications right before discharge.  #4-history CVA he appears to have minimal deficits she is on Plavix as well as a statin.  #5 history of anemia this appears to have rebounded on iron hemoglobin has risen up to 9.8 on most recent lab will recheck this before discharge.  #6 history of thrombocytopenia again this appears resolved as well but will update this before discharge.  Clinically she appears to be doing well looking forward to going to assisted living she will need continued PT and OT for strengthening she is weightbearing as tolerated on the left lower extremity nonweightbearing on the left upper extremity-at this point continues on Lovenox which will have to be readdressed in the near future about length of therapy.  I have written for 15 Percocet no refills will have  follow-up expediently with primary care provider also has orthopedic follow-up  [id number]-of note greater than 30 minutes spent on this  discharge summary-greater than 50% of time spent coordinating plan of care for numerous diagnoses

## 2017-01-17 ENCOUNTER — Encounter (HOSPITAL_COMMUNITY)
Admission: RE | Admit: 2017-01-17 | Discharge: 2017-01-17 | Disposition: A | Discharge status: Home / Self Care | Payer: Medicare Other | Source: Skilled Nursing Facility | Attending: Internal Medicine | Admitting: Internal Medicine

## 2017-01-17 DIAGNOSIS — Z4789 Encounter for other orthopedic aftercare: Secondary | ICD-10-CM | POA: Insufficient documentation

## 2017-01-17 DIAGNOSIS — Z9181 History of falling: Secondary | ICD-10-CM | POA: Insufficient documentation

## 2017-01-17 DIAGNOSIS — E785 Hyperlipidemia, unspecified: Secondary | ICD-10-CM | POA: Insufficient documentation

## 2017-01-17 DIAGNOSIS — S72012D Unspecified intracapsular fracture of left femur, subsequent encounter for closed fracture with routine healing: Secondary | ICD-10-CM | POA: Insufficient documentation

## 2017-01-17 LAB — BASIC METABOLIC PANEL
ANION GAP: 7 (ref 5–15)
BUN: 20 mg/dL (ref 6–20)
CALCIUM: 9.6 mg/dL (ref 8.9–10.3)
CO2: 27 mmol/L (ref 22–32)
Chloride: 108 mmol/L (ref 101–111)
Creatinine, Ser: 0.78 mg/dL (ref 0.44–1.00)
GFR calc non Af Amer: >60 mL/min (ref >60)
GLUCOSE: 94 mg/dL (ref 65–99)
Potassium: 4.1 mmol/L (ref 3.5–5.1)
Sodium: 142 mmol/L (ref 135–145)

## 2017-01-17 LAB — CBC WITH DIFFERENTIAL/PLATELET
BASOS ABS: 0 10*3/uL (ref 0.0–0.1)
BASOS PCT: 1 %
EOS ABS: 0.3 10*3/uL (ref 0.0–0.7)
EOS PCT: 3 %
HCT: 32 % — ABNORMAL LOW (ref 36.0–46.0)
Hemoglobin: 10.2 g/dL — ABNORMAL LOW (ref 12.0–15.0)
Lymphocytes Relative: 8 %
Lymphs Abs: 0.6 10*3/uL — ABNORMAL LOW (ref 0.7–4.0)
MCH: 31.4 pg (ref 26.0–34.0)
MCHC: 31.9 g/dL (ref 30.0–36.0)
MCV: 98.5 fL (ref 78.0–100.0)
Monocytes Absolute: 0.8 10*3/uL (ref 0.1–1.0)
Monocytes Relative: 10 %
Neutro Abs: 6.4 10*3/uL (ref 1.7–7.7)
Neutrophils Relative %: 78 %
PLATELETS: 376 10*3/uL (ref 150–400)
RBC: 3.25 MIL/uL — ABNORMAL LOW (ref 3.87–5.11)
RDW: 14.4 % (ref 11.5–15.5)
WBC: 8.2 10*3/uL (ref 4.0–10.5)

## 2017-01-18 DIAGNOSIS — Z79891 Long term (current) use of opiate analgesic: Secondary | ICD-10-CM | POA: Diagnosis not present

## 2017-01-18 DIAGNOSIS — I2510 Atherosclerotic heart disease of native coronary artery without angina pectoris: Secondary | ICD-10-CM | POA: Diagnosis not present

## 2017-01-18 DIAGNOSIS — S72012D Unspecified intracapsular fracture of left femur, subsequent encounter for closed fracture with routine healing: Secondary | ICD-10-CM | POA: Diagnosis not present

## 2017-01-18 DIAGNOSIS — I69354 Hemiplegia and hemiparesis following cerebral infarction affecting left non-dominant side: Secondary | ICD-10-CM | POA: Diagnosis not present

## 2017-01-18 DIAGNOSIS — Z9181 History of falling: Secondary | ICD-10-CM | POA: Diagnosis not present

## 2017-01-18 DIAGNOSIS — Z7902 Long term (current) use of antithrombotics/antiplatelets: Secondary | ICD-10-CM | POA: Diagnosis not present

## 2017-01-18 DIAGNOSIS — S42212D Unspecified displaced fracture of surgical neck of left humerus, subsequent encounter for fracture with routine healing: Secondary | ICD-10-CM | POA: Diagnosis not present

## 2017-01-18 DIAGNOSIS — I10 Essential (primary) hypertension: Secondary | ICD-10-CM | POA: Diagnosis not present

## 2017-01-19 DIAGNOSIS — I2510 Atherosclerotic heart disease of native coronary artery without angina pectoris: Secondary | ICD-10-CM | POA: Diagnosis not present

## 2017-01-19 DIAGNOSIS — S42212D Unspecified displaced fracture of surgical neck of left humerus, subsequent encounter for fracture with routine healing: Secondary | ICD-10-CM | POA: Diagnosis not present

## 2017-01-19 DIAGNOSIS — S72012D Unspecified intracapsular fracture of left femur, subsequent encounter for closed fracture with routine healing: Secondary | ICD-10-CM | POA: Diagnosis not present

## 2017-01-19 DIAGNOSIS — I10 Essential (primary) hypertension: Secondary | ICD-10-CM | POA: Diagnosis not present

## 2017-01-19 DIAGNOSIS — Z7902 Long term (current) use of antithrombotics/antiplatelets: Secondary | ICD-10-CM | POA: Diagnosis not present

## 2017-01-19 DIAGNOSIS — Z9181 History of falling: Secondary | ICD-10-CM | POA: Diagnosis not present

## 2017-01-19 DIAGNOSIS — I69354 Hemiplegia and hemiparesis following cerebral infarction affecting left non-dominant side: Secondary | ICD-10-CM | POA: Diagnosis not present

## 2017-01-19 DIAGNOSIS — Z79891 Long term (current) use of opiate analgesic: Secondary | ICD-10-CM | POA: Diagnosis not present

## 2017-01-20 DIAGNOSIS — S72012D Unspecified intracapsular fracture of left femur, subsequent encounter for closed fracture with routine healing: Secondary | ICD-10-CM | POA: Diagnosis not present

## 2017-01-20 DIAGNOSIS — I10 Essential (primary) hypertension: Secondary | ICD-10-CM | POA: Diagnosis not present

## 2017-01-20 DIAGNOSIS — Z9181 History of falling: Secondary | ICD-10-CM | POA: Diagnosis not present

## 2017-01-20 DIAGNOSIS — Z7902 Long term (current) use of antithrombotics/antiplatelets: Secondary | ICD-10-CM | POA: Diagnosis not present

## 2017-01-20 DIAGNOSIS — S42212D Unspecified displaced fracture of surgical neck of left humerus, subsequent encounter for fracture with routine healing: Secondary | ICD-10-CM | POA: Diagnosis not present

## 2017-01-20 DIAGNOSIS — Z79891 Long term (current) use of opiate analgesic: Secondary | ICD-10-CM | POA: Diagnosis not present

## 2017-01-20 DIAGNOSIS — I2510 Atherosclerotic heart disease of native coronary artery without angina pectoris: Secondary | ICD-10-CM | POA: Diagnosis not present

## 2017-01-20 DIAGNOSIS — I69354 Hemiplegia and hemiparesis following cerebral infarction affecting left non-dominant side: Secondary | ICD-10-CM | POA: Diagnosis not present

## 2017-01-21 DIAGNOSIS — S72012D Unspecified intracapsular fracture of left femur, subsequent encounter for closed fracture with routine healing: Secondary | ICD-10-CM | POA: Diagnosis not present

## 2017-01-21 DIAGNOSIS — I69354 Hemiplegia and hemiparesis following cerebral infarction affecting left non-dominant side: Secondary | ICD-10-CM | POA: Diagnosis not present

## 2017-01-21 DIAGNOSIS — Z7902 Long term (current) use of antithrombotics/antiplatelets: Secondary | ICD-10-CM | POA: Diagnosis not present

## 2017-01-21 DIAGNOSIS — S42212D Unspecified displaced fracture of surgical neck of left humerus, subsequent encounter for fracture with routine healing: Secondary | ICD-10-CM | POA: Diagnosis not present

## 2017-01-21 DIAGNOSIS — I10 Essential (primary) hypertension: Secondary | ICD-10-CM | POA: Diagnosis not present

## 2017-01-21 DIAGNOSIS — Z79891 Long term (current) use of opiate analgesic: Secondary | ICD-10-CM | POA: Diagnosis not present

## 2017-01-21 DIAGNOSIS — I2510 Atherosclerotic heart disease of native coronary artery without angina pectoris: Secondary | ICD-10-CM | POA: Diagnosis not present

## 2017-01-21 DIAGNOSIS — Z9181 History of falling: Secondary | ICD-10-CM | POA: Diagnosis not present

## 2017-01-22 ENCOUNTER — Other Ambulatory Visit (HOSPITAL_COMMUNITY): Payer: Self-pay | Admitting: Internal Medicine

## 2017-01-22 ENCOUNTER — Ambulatory Visit (HOSPITAL_COMMUNITY)
Admission: RE | Admit: 2017-01-22 | Discharge: 2017-01-22 | Disposition: A | Discharge status: Home / Self Care | Payer: Medicare Other | Source: Ambulatory Visit | Attending: Internal Medicine | Admitting: Internal Medicine

## 2017-01-22 DIAGNOSIS — Z9181 History of falling: Secondary | ICD-10-CM | POA: Diagnosis not present

## 2017-01-22 DIAGNOSIS — R059 Cough, unspecified: Secondary | ICD-10-CM

## 2017-01-22 DIAGNOSIS — I2510 Atherosclerotic heart disease of native coronary artery without angina pectoris: Secondary | ICD-10-CM | POA: Diagnosis not present

## 2017-01-22 DIAGNOSIS — K219 Gastro-esophageal reflux disease without esophagitis: Secondary | ICD-10-CM | POA: Diagnosis not present

## 2017-01-22 DIAGNOSIS — J984 Other disorders of lung: Secondary | ICD-10-CM | POA: Diagnosis not present

## 2017-01-22 DIAGNOSIS — S72012D Unspecified intracapsular fracture of left femur, subsequent encounter for closed fracture with routine healing: Secondary | ICD-10-CM | POA: Diagnosis not present

## 2017-01-22 DIAGNOSIS — R0602 Shortness of breath: Secondary | ICD-10-CM | POA: Diagnosis not present

## 2017-01-22 DIAGNOSIS — R05 Cough: Secondary | ICD-10-CM | POA: Insufficient documentation

## 2017-01-22 DIAGNOSIS — E782 Mixed hyperlipidemia: Secondary | ICD-10-CM | POA: Diagnosis not present

## 2017-01-22 DIAGNOSIS — J9801 Acute bronchospasm: Secondary | ICD-10-CM | POA: Diagnosis not present

## 2017-01-22 DIAGNOSIS — Z79891 Long term (current) use of opiate analgesic: Secondary | ICD-10-CM | POA: Diagnosis not present

## 2017-01-22 DIAGNOSIS — I10 Essential (primary) hypertension: Secondary | ICD-10-CM | POA: Diagnosis not present

## 2017-01-22 DIAGNOSIS — I517 Cardiomegaly: Secondary | ICD-10-CM | POA: Insufficient documentation

## 2017-01-22 DIAGNOSIS — S42212D Unspecified displaced fracture of surgical neck of left humerus, subsequent encounter for fracture with routine healing: Secondary | ICD-10-CM | POA: Diagnosis not present

## 2017-01-22 DIAGNOSIS — Z6820 Body mass index (BMI) 20.0-20.9, adult: Secondary | ICD-10-CM | POA: Diagnosis not present

## 2017-01-22 DIAGNOSIS — J209 Acute bronchitis, unspecified: Secondary | ICD-10-CM | POA: Diagnosis not present

## 2017-01-22 DIAGNOSIS — I69354 Hemiplegia and hemiparesis following cerebral infarction affecting left non-dominant side: Secondary | ICD-10-CM | POA: Diagnosis not present

## 2017-01-22 DIAGNOSIS — Z7902 Long term (current) use of antithrombotics/antiplatelets: Secondary | ICD-10-CM | POA: Diagnosis not present

## 2017-01-23 DIAGNOSIS — I2510 Atherosclerotic heart disease of native coronary artery without angina pectoris: Secondary | ICD-10-CM | POA: Diagnosis not present

## 2017-01-23 DIAGNOSIS — Z9181 History of falling: Secondary | ICD-10-CM | POA: Diagnosis not present

## 2017-01-23 DIAGNOSIS — S42212D Unspecified displaced fracture of surgical neck of left humerus, subsequent encounter for fracture with routine healing: Secondary | ICD-10-CM | POA: Diagnosis not present

## 2017-01-23 DIAGNOSIS — Z79891 Long term (current) use of opiate analgesic: Secondary | ICD-10-CM | POA: Diagnosis not present

## 2017-01-23 DIAGNOSIS — Z7902 Long term (current) use of antithrombotics/antiplatelets: Secondary | ICD-10-CM | POA: Diagnosis not present

## 2017-01-23 DIAGNOSIS — I69354 Hemiplegia and hemiparesis following cerebral infarction affecting left non-dominant side: Secondary | ICD-10-CM | POA: Diagnosis not present

## 2017-01-23 DIAGNOSIS — S72012D Unspecified intracapsular fracture of left femur, subsequent encounter for closed fracture with routine healing: Secondary | ICD-10-CM | POA: Diagnosis not present

## 2017-01-23 DIAGNOSIS — I10 Essential (primary) hypertension: Secondary | ICD-10-CM | POA: Diagnosis not present

## 2017-01-24 DIAGNOSIS — Z79891 Long term (current) use of opiate analgesic: Secondary | ICD-10-CM | POA: Diagnosis not present

## 2017-01-24 DIAGNOSIS — S72012D Unspecified intracapsular fracture of left femur, subsequent encounter for closed fracture with routine healing: Secondary | ICD-10-CM | POA: Diagnosis not present

## 2017-01-24 DIAGNOSIS — Z9181 History of falling: Secondary | ICD-10-CM | POA: Diagnosis not present

## 2017-01-24 DIAGNOSIS — I2510 Atherosclerotic heart disease of native coronary artery without angina pectoris: Secondary | ICD-10-CM | POA: Diagnosis not present

## 2017-01-24 DIAGNOSIS — I10 Essential (primary) hypertension: Secondary | ICD-10-CM | POA: Diagnosis not present

## 2017-01-24 DIAGNOSIS — Z7902 Long term (current) use of antithrombotics/antiplatelets: Secondary | ICD-10-CM | POA: Diagnosis not present

## 2017-01-24 DIAGNOSIS — I69354 Hemiplegia and hemiparesis following cerebral infarction affecting left non-dominant side: Secondary | ICD-10-CM | POA: Diagnosis not present

## 2017-01-24 DIAGNOSIS — S42212D Unspecified displaced fracture of surgical neck of left humerus, subsequent encounter for fracture with routine healing: Secondary | ICD-10-CM | POA: Diagnosis not present

## 2017-01-25 DIAGNOSIS — S72012D Unspecified intracapsular fracture of left femur, subsequent encounter for closed fracture with routine healing: Secondary | ICD-10-CM | POA: Diagnosis not present

## 2017-01-25 DIAGNOSIS — S42212D Unspecified displaced fracture of surgical neck of left humerus, subsequent encounter for fracture with routine healing: Secondary | ICD-10-CM | POA: Diagnosis not present

## 2017-01-25 DIAGNOSIS — I69354 Hemiplegia and hemiparesis following cerebral infarction affecting left non-dominant side: Secondary | ICD-10-CM | POA: Diagnosis not present

## 2017-01-25 DIAGNOSIS — Z7902 Long term (current) use of antithrombotics/antiplatelets: Secondary | ICD-10-CM | POA: Diagnosis not present

## 2017-01-25 DIAGNOSIS — Z79891 Long term (current) use of opiate analgesic: Secondary | ICD-10-CM | POA: Diagnosis not present

## 2017-01-25 DIAGNOSIS — Z9181 History of falling: Secondary | ICD-10-CM | POA: Diagnosis not present

## 2017-01-25 DIAGNOSIS — I10 Essential (primary) hypertension: Secondary | ICD-10-CM | POA: Diagnosis not present

## 2017-01-25 DIAGNOSIS — I2510 Atherosclerotic heart disease of native coronary artery without angina pectoris: Secondary | ICD-10-CM | POA: Diagnosis not present

## 2017-01-28 DIAGNOSIS — Z9181 History of falling: Secondary | ICD-10-CM | POA: Diagnosis not present

## 2017-01-28 DIAGNOSIS — I10 Essential (primary) hypertension: Secondary | ICD-10-CM | POA: Diagnosis not present

## 2017-01-28 DIAGNOSIS — S72012D Unspecified intracapsular fracture of left femur, subsequent encounter for closed fracture with routine healing: Secondary | ICD-10-CM | POA: Diagnosis not present

## 2017-01-28 DIAGNOSIS — I2510 Atherosclerotic heart disease of native coronary artery without angina pectoris: Secondary | ICD-10-CM | POA: Diagnosis not present

## 2017-01-28 DIAGNOSIS — S42212D Unspecified displaced fracture of surgical neck of left humerus, subsequent encounter for fracture with routine healing: Secondary | ICD-10-CM | POA: Diagnosis not present

## 2017-01-28 DIAGNOSIS — Z7902 Long term (current) use of antithrombotics/antiplatelets: Secondary | ICD-10-CM | POA: Diagnosis not present

## 2017-01-28 DIAGNOSIS — I69354 Hemiplegia and hemiparesis following cerebral infarction affecting left non-dominant side: Secondary | ICD-10-CM | POA: Diagnosis not present

## 2017-01-28 DIAGNOSIS — Z79891 Long term (current) use of opiate analgesic: Secondary | ICD-10-CM | POA: Diagnosis not present

## 2017-01-29 ENCOUNTER — Encounter (HOSPITAL_COMMUNITY): Payer: Self-pay | Admitting: Emergency Medicine

## 2017-01-29 ENCOUNTER — Emergency Department (HOSPITAL_COMMUNITY): Payer: Medicare Other

## 2017-01-29 ENCOUNTER — Inpatient Hospital Stay (HOSPITAL_COMMUNITY)
Admission: EM | Admit: 2017-01-29 | Discharge: 2017-01-31 | DRG: 191 | Disposition: A | Discharge status: Other Acute Inpatient Hospital | Payer: Medicare Other | Source: Other Acute Inpatient Hospital | Attending: Internal Medicine | Admitting: Internal Medicine

## 2017-01-29 DIAGNOSIS — R0600 Dyspnea, unspecified: Secondary | ICD-10-CM | POA: Diagnosis not present

## 2017-01-29 DIAGNOSIS — J989 Respiratory disorder, unspecified: Secondary | ICD-10-CM | POA: Diagnosis not present

## 2017-01-29 DIAGNOSIS — I10 Essential (primary) hypertension: Secondary | ICD-10-CM | POA: Diagnosis not present

## 2017-01-29 DIAGNOSIS — R062 Wheezing: Secondary | ICD-10-CM | POA: Diagnosis not present

## 2017-01-29 DIAGNOSIS — I69354 Hemiplegia and hemiparesis following cerebral infarction affecting left non-dominant side: Secondary | ICD-10-CM

## 2017-01-29 DIAGNOSIS — I119 Hypertensive heart disease without heart failure: Secondary | ICD-10-CM | POA: Diagnosis present

## 2017-01-29 DIAGNOSIS — R059 Cough, unspecified: Secondary | ICD-10-CM

## 2017-01-29 DIAGNOSIS — Z79899 Other long term (current) drug therapy: Secondary | ICD-10-CM | POA: Diagnosis not present

## 2017-01-29 DIAGNOSIS — I6930 Unspecified sequelae of cerebral infarction: Secondary | ICD-10-CM

## 2017-01-29 DIAGNOSIS — F418 Other specified anxiety disorders: Secondary | ICD-10-CM | POA: Diagnosis present

## 2017-01-29 DIAGNOSIS — Z79891 Long term (current) use of opiate analgesic: Secondary | ICD-10-CM | POA: Diagnosis not present

## 2017-01-29 DIAGNOSIS — I351 Nonrheumatic aortic (valve) insufficiency: Secondary | ICD-10-CM | POA: Diagnosis not present

## 2017-01-29 DIAGNOSIS — J440 Chronic obstructive pulmonary disease with acute lower respiratory infection: Principal | ICD-10-CM | POA: Diagnosis present

## 2017-01-29 DIAGNOSIS — K219 Gastro-esophageal reflux disease without esophagitis: Secondary | ICD-10-CM | POA: Diagnosis present

## 2017-01-29 DIAGNOSIS — Z7901 Long term (current) use of anticoagulants: Secondary | ICD-10-CM | POA: Diagnosis not present

## 2017-01-29 DIAGNOSIS — Z7902 Long term (current) use of antithrombotics/antiplatelets: Secondary | ICD-10-CM

## 2017-01-29 DIAGNOSIS — E876 Hypokalemia: Secondary | ICD-10-CM | POA: Diagnosis not present

## 2017-01-29 DIAGNOSIS — J209 Acute bronchitis, unspecified: Secondary | ICD-10-CM | POA: Diagnosis not present

## 2017-01-29 DIAGNOSIS — S72012D Unspecified intracapsular fracture of left femur, subsequent encounter for closed fracture with routine healing: Secondary | ICD-10-CM | POA: Diagnosis not present

## 2017-01-29 DIAGNOSIS — Z96649 Presence of unspecified artificial hip joint: Secondary | ICD-10-CM | POA: Diagnosis not present

## 2017-01-29 DIAGNOSIS — Z9181 History of falling: Secondary | ICD-10-CM | POA: Diagnosis not present

## 2017-01-29 DIAGNOSIS — Z96642 Presence of left artificial hip joint: Secondary | ICD-10-CM | POA: Diagnosis not present

## 2017-01-29 DIAGNOSIS — D696 Thrombocytopenia, unspecified: Secondary | ICD-10-CM | POA: Diagnosis not present

## 2017-01-29 DIAGNOSIS — J204 Acute bronchitis due to parainfluenza virus: Secondary | ICD-10-CM | POA: Diagnosis not present

## 2017-01-29 DIAGNOSIS — K5900 Constipation, unspecified: Secondary | ICD-10-CM | POA: Diagnosis not present

## 2017-01-29 DIAGNOSIS — I2510 Atherosclerotic heart disease of native coronary artery without angina pectoris: Secondary | ICD-10-CM | POA: Diagnosis not present

## 2017-01-29 DIAGNOSIS — R05 Cough: Secondary | ICD-10-CM

## 2017-01-29 DIAGNOSIS — R251 Tremor, unspecified: Secondary | ICD-10-CM | POA: Diagnosis present

## 2017-01-29 DIAGNOSIS — I517 Cardiomegaly: Secondary | ICD-10-CM | POA: Diagnosis present

## 2017-01-29 DIAGNOSIS — S42212D Unspecified displaced fracture of surgical neck of left humerus, subsequent encounter for fracture with routine healing: Secondary | ICD-10-CM | POA: Diagnosis not present

## 2017-01-29 DIAGNOSIS — E785 Hyperlipidemia, unspecified: Secondary | ICD-10-CM | POA: Diagnosis present

## 2017-01-29 LAB — CBC WITH DIFFERENTIAL/PLATELET
BASOS ABS: 0 10*3/uL (ref 0.0–0.1)
Basophils Relative: 0 %
EOS ABS: 0.3 10*3/uL (ref 0.0–0.7)
EOS PCT: 2 %
HCT: 37.4 % (ref 36.0–46.0)
HEMOGLOBIN: 11.9 g/dL — AB (ref 12.0–15.0)
LYMPHS ABS: 1 10*3/uL (ref 0.7–4.0)
LYMPHS PCT: 9 %
MCH: 30.5 pg (ref 26.0–34.0)
MCHC: 31.8 g/dL (ref 30.0–36.0)
MCV: 95.9 fL (ref 78.0–100.0)
Monocytes Absolute: 0.7 10*3/uL (ref 0.1–1.0)
Monocytes Relative: 6 %
NEUTROS PCT: 83 %
Neutro Abs: 8.9 10*3/uL — ABNORMAL HIGH (ref 1.7–7.7)
PLATELETS: 284 10*3/uL (ref 150–400)
RBC: 3.9 MIL/uL (ref 3.87–5.11)
RDW: 14.1 % (ref 11.5–15.5)
WBC: 10.9 10*3/uL — AB (ref 4.0–10.5)

## 2017-01-29 LAB — COMPREHENSIVE METABOLIC PANEL
ALK PHOS: 208 U/L — AB (ref 38–126)
ALT: 16 U/L (ref 14–54)
AST: 19 U/L (ref 15–41)
Albumin: 3.1 g/dL — ABNORMAL LOW (ref 3.5–5.0)
Anion gap: 7 (ref 5–15)
BUN: 24 mg/dL — AB (ref 6–20)
CALCIUM: 9.9 mg/dL (ref 8.9–10.3)
CHLORIDE: 106 mmol/L (ref 101–111)
CO2: 26 mmol/L (ref 22–32)
CREATININE: 0.82 mg/dL (ref 0.44–1.00)
GFR calc Af Amer: >60 mL/min (ref >60)
GFR calc non Af Amer: 59 mL/min — ABNORMAL LOW (ref >60)
Glucose, Bld: 102 mg/dL — ABNORMAL HIGH (ref 65–99)
Potassium: 3.8 mmol/L (ref 3.5–5.1)
SODIUM: 139 mmol/L (ref 135–145)
Total Bilirubin: 0.4 mg/dL (ref 0.3–1.2)
Total Protein: 6.5 g/dL (ref 6.5–8.1)

## 2017-01-29 LAB — MAGNESIUM: MAGNESIUM: 1.8 mg/dL (ref 1.7–2.4)

## 2017-01-29 MED ORDER — METHOCARBAMOL 500 MG PO TABS: 500.0000 mg | ORAL_TABLET | Freq: Three times a day (TID) | ORAL | Status: DC | PRN

## 2017-01-29 MED ORDER — ENSURE ENLIVE PO LIQD
237.0000 mL | Freq: Two times a day (BID) | ORAL | Status: DC
  Administered 2017-01-30: 237 mL via ORAL

## 2017-01-29 MED ORDER — LEVOFLOXACIN IN D5W 500 MG/100ML IV SOLN
500.0000 mg | Freq: Once | INTRAVENOUS | Status: AC
  Administered 2017-01-29: 500 mg via INTRAVENOUS
  Filled 2017-01-29: qty 100

## 2017-01-29 MED ORDER — ACETAMINOPHEN 325 MG PO TABS: 650.0000 mg | ORAL_TABLET | Freq: Four times a day (QID) | ORAL | Status: DC | PRN

## 2017-01-29 MED ORDER — ENOXAPARIN SODIUM 30 MG/0.3ML ~~LOC~~ SOLN
30.0000 mg | SUBCUTANEOUS | Status: DC
  Administered 2017-01-30: 30 mg via SUBCUTANEOUS
  Filled 2017-01-29: qty 0.3

## 2017-01-29 MED ORDER — METHYLPREDNISOLONE SODIUM SUCC 125 MG IJ SOLR
125.0000 mg | Freq: Once | INTRAMUSCULAR | Status: AC
  Administered 2017-01-29: 125 mg via INTRAVENOUS
  Filled 2017-01-29: qty 2

## 2017-01-29 MED ORDER — SIMVASTATIN 20 MG PO TABS
40.0000 mg | ORAL_TABLET | Freq: Every day | ORAL | Status: DC
  Administered 2017-01-30: 40 mg via ORAL
  Filled 2017-01-29 (×2): qty 2

## 2017-01-29 MED ORDER — POLYETHYLENE GLYCOL 3350 17 G PO PACK: 17.0000 g | PACK | Freq: Every day | ORAL | Status: DC | PRN

## 2017-01-29 MED ORDER — LEVALBUTEROL HCL 1.25 MG/0.5ML IN NEBU
1.2500 mg | INHALATION_SOLUTION | Freq: Three times a day (TID) | RESPIRATORY_TRACT | Status: DC
  Administered 2017-01-30 – 2017-01-31 (×5): 1.25 mg via RESPIRATORY_TRACT
  Filled 2017-01-29 (×5): qty 0.5

## 2017-01-29 MED ORDER — OXYBUTYNIN CHLORIDE 5 MG PO TABS: 5.0000 mg | ORAL_TABLET | Freq: Three times a day (TID) | ORAL | Status: DC | PRN

## 2017-01-29 MED ORDER — ACETAMINOPHEN 650 MG RE SUPP: 650.0000 mg | Freq: Four times a day (QID) | RECTAL | Status: DC | PRN

## 2017-01-29 MED ORDER — POLYETHYLENE GLYCOL 3350 17 G PO PACK
17.0000 g | PACK | Freq: Every day | ORAL | Status: DC
  Administered 2017-01-31: 17 g via ORAL
  Filled 2017-01-29 (×2): qty 1

## 2017-01-29 MED ORDER — ONDANSETRON HCL 4 MG PO TABS: 4.0000 mg | ORAL_TABLET | Freq: Four times a day (QID) | ORAL | Status: DC | PRN

## 2017-01-29 MED ORDER — LEVOFLOXACIN IN D5W 500 MG/100ML IV SOLN
500.0000 mg | INTRAVENOUS | Status: DC
  Filled 2017-01-29: qty 100

## 2017-01-29 MED ORDER — LEVALBUTEROL HCL 0.63 MG/3ML IN NEBU: 0.6300 mg | INHALATION_SOLUTION | Freq: Four times a day (QID) | RESPIRATORY_TRACT | Status: DC | PRN

## 2017-01-29 MED ORDER — ENOXAPARIN SODIUM 40 MG/0.4ML ~~LOC~~ SOLN: 40.0000 mg | SUBCUTANEOUS | Status: DC

## 2017-01-29 MED ORDER — LEVOFLOXACIN IN D5W 500 MG/100ML IV SOLN: 500.0000 mg | INTRAVENOUS | Status: DC

## 2017-01-29 MED ORDER — LEVALBUTEROL HCL 1.25 MG/0.5ML IN NEBU
1.2500 mg | INHALATION_SOLUTION | Freq: Four times a day (QID) | RESPIRATORY_TRACT | Status: DC | PRN
  Administered 2017-01-29: 1.25 mg via RESPIRATORY_TRACT
  Filled 2017-01-29: qty 0.5

## 2017-01-29 MED ORDER — CLOPIDOGREL BISULFATE 75 MG PO TABS
75.0000 mg | ORAL_TABLET | Freq: Every day | ORAL | Status: DC
  Administered 2017-01-30 – 2017-01-31 (×2): 75 mg via ORAL
  Filled 2017-01-29 (×2): qty 1

## 2017-01-29 MED ORDER — SENNA 8.6 MG PO TABS
1.0000 | ORAL_TABLET | Freq: Two times a day (BID) | ORAL | Status: DC
  Administered 2017-01-30 – 2017-01-31 (×2): 8.6 mg via ORAL
  Filled 2017-01-29 (×4): qty 1

## 2017-01-29 MED ORDER — OXYCODONE-ACETAMINOPHEN 5-325 MG PO TABS
1.0000 | ORAL_TABLET | ORAL | Status: DC | PRN
Start: 2017-01-29 — End: 2017-01-31
  Administered 2017-01-30: 1 via ORAL
  Filled 2017-01-29: qty 1

## 2017-01-29 MED ORDER — SERTRALINE HCL 50 MG PO TABS
25.0000 mg | ORAL_TABLET | Freq: Every day | ORAL | Status: DC
  Administered 2017-01-30 – 2017-01-31 (×2): 25 mg via ORAL
  Filled 2017-01-29 (×2): qty 1

## 2017-01-29 MED ORDER — ALBUTEROL (5 MG/ML) CONTINUOUS INHALATION SOLN
10.0000 mg/h | INHALATION_SOLUTION | RESPIRATORY_TRACT | Status: DC
  Administered 2017-01-29: 10 mg/h via RESPIRATORY_TRACT
  Filled 2017-01-29: qty 20

## 2017-01-29 MED ORDER — ALBUTEROL SULFATE 2 MG PO TABS
2.0000 mg | ORAL_TABLET | Freq: Two times a day (BID) | ORAL | Status: DC
  Administered 2017-01-30 – 2017-01-31 (×3): 2 mg via ORAL
  Filled 2017-01-29 (×6): qty 1

## 2017-01-29 MED ORDER — ALBUTEROL SULFATE (2.5 MG/3ML) 0.083% IN NEBU
5.0000 mg | INHALATION_SOLUTION | Freq: Once | RESPIRATORY_TRACT | Status: AC
  Administered 2017-01-29: 5 mg via RESPIRATORY_TRACT
  Filled 2017-01-29: qty 6

## 2017-01-29 MED ORDER — ASPIRIN 81 MG PO CHEW
81.0000 mg | CHEWABLE_TABLET | Freq: Every day | ORAL | Status: DC
  Administered 2017-01-30 – 2017-01-31 (×2): 81 mg via ORAL
  Filled 2017-01-29 (×2): qty 1

## 2017-01-29 MED ORDER — METHYLPREDNISOLONE SODIUM SUCC 125 MG IJ SOLR
60.0000 mg | Freq: Two times a day (BID) | INTRAMUSCULAR | Status: DC
  Administered 2017-01-30 – 2017-01-31 (×3): 60 mg via INTRAVENOUS
  Filled 2017-01-29 (×3): qty 2

## 2017-01-29 MED ORDER — ONDANSETRON HCL 4 MG/2ML IJ SOLN: 4.0000 mg | Freq: Four times a day (QID) | INTRAMUSCULAR | Status: DC | PRN

## 2017-01-29 MED ORDER — VITAMIN B-12 100 MCG PO TABS
100.0000 ug | ORAL_TABLET | Freq: Every day | ORAL | Status: DC
  Filled 2017-01-29 (×2): qty 1

## 2017-01-29 NOTE — ED Provider Notes (Signed)
[facility]-EMERGENCY DEPT Provider Note   CSN: [id number] Arrival date & time: 01/29/17  0945     History   Chief Complaint Chief Complaint  Patient presents with  . Wheezing    HPI Debbie Webb is a 83 y.o. female.  HPI Patient being treated for respiratory infection. Has had persistent productive cough and shortness of breath.she is coming from rehabilitation facility by EMS. No definite fevers or chills. No chest pain. Past Medical History:  Diagnosis Date  . Carotid artery disease (HCC)    Status post carotid endarterectomy x2  . Essential hypertension, benign   . Hemiparesis affecting left side as late effect of cerebrovascular accident (HCC) 08/03/2011  . Stroke (HCC) 1997  . Tremor     Patient Active Problem List   Diagnosis Date Noted  . S/P left hip hemiarthroplasty 01/01/2017  . Left displaced femoral neck fracture (HCC) 12/28/2016  . HLD (hyperlipidemia) 12/27/2016  . Fracture of femoral neck, left, closed (HCC) 12/27/2016  . Fx humeral neck, left, closed, initial encounter 12/27/2016  . Depression with anxiety 12/27/2016  . Essential hypertension, benign   . Aortic stenosis 09/12/2012  . GERD (gastroesophageal reflux disease) 01/26/2012  . Atypical chest pain 01/25/2012  . Carotid artery disease (HCC)   . Ankle sprain 08/23/2011  . Hypothermia 08/03/2011  . Fall 08/03/2011  . Hypokalemia 08/03/2011  . Leukocytosis 08/03/2011  . Azotemia 08/03/2011  . Left hip pain 08/03/2011  . Personal history of stroke with residual effects 08/03/2011  . Hemiparesis affecting left side as late effect of cerebrovascular accident (HCC) 08/03/2011  . Bradycardia 08/03/2011  . Hand ulceration (HCC) 08/03/2011  . Elevated LFTs 08/03/2011  . Elevated troponin I level 08/03/2011  . Rhabdomyolysis 08/03/2011  . Stroke [facility]) 05/15/1995    Past Surgical History:  Procedure Laterality Date  . ANTERIOR APPROACH HEMI HIP ARTHROPLASTY Left 12/28/2016   Procedure: ANTERIOR  APPROACH HEMI HIP ARTHROPLASTY;  Surgeon: Samson Frederic, MD;  Location: [facility] OR;  Service: Orthopedics;  Laterality: Left;  . CAROTID ENDARTERECTOMY     Right    OB History    No data available       Home Medications    Prior to Admission medications   Medication Sig Start Date End Date Taking? Authorizing Provider  amLODipine (NORVASC) 5 MG tablet Take 5 mg by mouth daily.    [provider]  Despina [employer] [facility]) OINT Apply to sacrum and bilateral buttocks q shift and prn for prevention.    [provider]  clopidogrel (PLAVIX) 75 MG tablet Take 75 mg by mouth daily.    [provider]  docusate sodium (COLACE) 100 MG capsule Take 1 capsule (100 mg total) by mouth 2 (two) times daily. 12/31/16   Glade Lloyd, MD  enoxaparin (LOVENOX) 30 MG/0.3ML injection Inject 30 mg into the skin daily.    [provider]  ENSURE (ENSURE) Take 237 mLs by mouth 2 (two) times daily between meals.    [provider]  hydrochlorothiazide (HYDRODIURIL) 25 MG tablet Take 25 mg by mouth every other day.     [provider]  meprobamate (EQUANIL) 400 MG tablet Take 400 mg by mouth 3 (three) times daily as needed for anxiety.     [provider]  methocarbamol (ROBAXIN) 500 MG tablet Take 1 tablet (500 mg total) by mouth every 8 (eight) hours as needed for muscle spasms. 12/31/16   Glade Lloyd, MD  Multiple Vitamins-Minerals (CENTRUM SILVER PO) Take 1 tablet  by mouth daily.     [provider]  oxybutynin (DITROPAN) 5 MG tablet Take 5 mg by mouth every 8 (eight) hours as needed for bladder spasms.    [provider]  oxyCODONE-acetaminophen (PERCOCET/ROXICET) 5-325 MG tablet Take 1 tablet by mouth every 4 (four) hours as needed for moderate pain. 12/31/16   Glade Lloyd, MD  polyethylene glycol (MIRALAX / GLYCOLAX) packet Take 17 g by mouth daily as needed for mild constipation. 12/31/16   Glade Lloyd, MD  senna  (SENOKOT) 8.6 MG TABS tablet Take 1 tablet (8.6 mg total) by mouth 2 (two) times daily. 12/31/16   Glade Lloyd, MD  sertraline (ZOLOFT) 50 MG tablet Take 50 mg by mouth daily.    [provider]  simvastatin (ZOCOR) 40 MG tablet Take 40 mg by mouth daily.    [provider]  vitamin B-12 (CYANOCOBALAMIN) 100 MCG tablet Take 100 mcg by mouth daily.    [provider]  vitamin C (ASCORBIC ACID) 500 MG tablet Take 500 mg by mouth daily.    [provider]    Family History Family History  Problem Relation Age of Onset  . Colon cancer Mother   . Heart disease Mother   . Bladder Cancer Brother   . Cirrhosis Brother        Liver cirrhosis    Social History Social History  Substance Use Topics  . Smoking status: Never Smoker  . Smokeless tobacco: Never Used  . Alcohol use No     Allergies   No known allergies   Review of Systems Review of Systems  Constitutional: Positive for appetite change and fatigue. Negative for fever.  HENT: Negative for sore throat and trouble swallowing.   Respiratory: Positive for cough, shortness of breath and wheezing.   Cardiovascular: Negative for chest pain, palpitations and leg swelling.  Gastrointestinal: Negative for abdominal pain, nausea and vomiting.  Musculoskeletal: Negative for back pain, myalgias and neck pain.  Skin: Negative for rash and wound.  Neurological: Positive for weakness (generalized). Negative for dizziness, light-headedness and numbness.  All other systems reviewed and are negative.    Physical Exam Updated Vital Signs BP (!) 162/78 (BP Location: Right Arm)   Pulse 67   Temp 97.9 F (36.6 C) (Oral)   Resp 18   Ht  (1.651 m)   Wt 53.5 kg (118 lb)   SpO2 96%   BMI 19.64 kg/m   Physical Exam  Constitutional: She appears well-developed and well-nourished. No distress.  HENT:  Head: Normocephalic and atraumatic.  Mouth/Throat: Oropharynx is clear and moist.  Eyes: Pupils  are equal, round, and reactive to light. EOM are normal.  Neck: Normal range of motion. Neck supple.  Cardiovascular: Normal rate and regular rhythm.  Exam reveals no friction rub.   No murmur heard. Pulmonary/Chest: Effort normal. She has wheezes. She has rales.  Diffuse inspiratory rhonchi and expiratory wheezes  Abdominal: Soft. Bowel sounds are normal. There is no tenderness. There is no rebound and no guarding.  Musculoskeletal: Normal range of motion. She exhibits no edema or tenderness.  No midline thoracic or lumbar tenderness. No CVA tenderness. No lower sugary swelling, asymmetry or tenderness.  Neurological: She is alert.  Mild confusion.   Skin: Skin is warm and dry. No rash noted. She is not diaphoretic. No erythema.  Psychiatric: She has a normal mood and affect. Her behavior is normal.  Nursing note and vitals reviewed.    ED Treatments / Results  Labs (all labs ordered are listed, but only abnormal results are displayed) Labs Reviewed  CBC WITH DIFFERENTIAL/PLATELET  COMPREHENSIVE METABOLIC PANEL    EKG  EKG Interpretation None       Radiology No results found.  Procedures Procedures (including critical care time)  Medications Ordered in ED Medications  methylPREDNISolone sodium succinate (SOLU-MEDROL) 125 mg/2 mL injection 125 mg (not administered)  albuterol (PROVENTIL) (2.5 MG/3ML) 0.083% nebulizer solution 5 mg (not administered)     Initial Impression / Assessment and Plan / ED Course  I have reviewed the triage vital signs and the nursing notes.  Pertinent labs & imaging results that were available during my care of the patient were reviewed by me and considered in my medical decision making (see chart for details).     Patient with persistent wheezing and shortness of breath despite breathing treatments and Solu-Medrol. Discussed with hospitalist who will admit.   Final Clinical Impressions(s) / ED Diagnoses   Final diagnoses:  Wheezing   Cough    New Prescriptions New Prescriptions   No medications on file     Loren Racer, MD 01/30/17 1257

## 2017-01-29 NOTE — Progress Notes (Addendum)
Pharmacy Antibiotic Note  Debbie Webb is a 83 y.o. female admitted on 01/29/2017 with pneumonia.  Pharmacy has been consulted for levaquin dosing.  Plan: Levaquin 500 mg IV q24 hours F/u renal function, cultures and clinical course  Height:  (165.1 cm) Weight: 118 lb (53.5 kg) IBW/kg (Calculated) : 57  Temp (24hrs), Avg:97.9 F (36.6 C), Min:97.9 F (36.6 C), Max:97.9 F (36.6 C)   Recent Labs Lab 01/29/17 1033  WBC 10.9*  CREATININE 0.82    Estimated Creatinine Clearance: 35.4 mL/min (by C-G formula based on SCr of 0.82 mg/dL).    Allergies  Allergen Reactions  . No Known Allergies     Thank you for allowing pharmacy to be a part of this patient's care.  Talbert Cage Poteet 01/29/2017 3:22 PM   Addum:  CrCl borderline for dose adjustment.  Will change Levaquin to 500 mg IV q48 hours.

## 2017-01-29 NOTE — H&P (Addendum)
[facility] History and Physical  Debbie Webb WUJ:[id number] DOB: 11-21-1920 DOA: 01/29/2017  Referring physician:   PCP: [name], MD   Chief Complaint:  Shortness of breath  HPI:   83 year old female with a history of carotid artery disease, CVA on   Plavix hypertension, previous history of stroke, recent left hip hemiarthroplasty on 01/01/17 , benign postoperative course except for some anemia and thrombocytopenia,   presents to the ED today from [facility] rehabilitation, with breathing issues,. Patient is a poor historian. Patient is being treated for acute bronchitis with albuterol and doxycycline at SNF. She has had some shortness of breath with wheezing noted by the nursing staff BP (!) 162/78 (BP Location: Right Arm)   Pulse 67   Temp 97.9 F (36.6 C) (Oral)   Resp 18   Ht  (1.651 m)   Wt 53.5 kg (118 lb)   SpO2 96%   BMI 19.64 kg/m  Labs essentially normal except hemoglobin of 11.9 which is her baseline, platelet count 284. Chest x-ray no acute disease. Patient treated with IV Solu-Medrol, albuterol and levofloxacin for acute bronchitis in the ED and admitted to telemetry     Review of Systems: negative for the following   Constitutional: Positive for appetite change and fatigue. Negative for fever.  HENT: Negative for sore throat and trouble swallowing.   Respiratory: Positive for cough, shortness of breath and wheezing.   Cardiovascular: Negative for chest pain, palpitations and leg swelling.  Gastrointestinal: Negative for abdominal pain, nausea and vomiting.  Musculoskeletal: Negative for back pain, myalgias and neck pain.  Skin: Negative for rash and wound.  Neurological: Positive for weakness (generalized). Negative for dizziness, light-headedness and numbness.  Hematological: Denies adenopathy. Easy bruising, personal or family bleeding history  Psychiatric/Behavioral: Denies suicidal ideation, mood changes, confusion, nervousness, sleep  disturbance and agitation       Past Medical History:  Diagnosis Date  . Carotid artery disease (HCC)    Status post carotid endarterectomy x2  . Essential hypertension, benign   . Hemiparesis affecting left side as late effect of cerebrovascular accident (HCC) 08/03/2011  . Stroke (HCC) 1997  . Tremor      Past Surgical History:  Procedure Laterality Date  . ANTERIOR APPROACH HEMI HIP ARTHROPLASTY Left 12/28/2016   Procedure: ANTERIOR APPROACH HEMI HIP ARTHROPLASTY;  Surgeon: Samson Frederic, MD;  Location: [facility] OR;  Service: Orthopedics;  Laterality: Left;  . CAROTID ENDARTERECTOMY     Right      Social History:  reports that she has never smoked. She has never used smokeless tobacco. She reports that she does not drink alcohol or use drugs.    Allergies  Allergen Reactions  . No Known Allergies     Family History  Problem Relation Age of Onset  . Colon cancer Mother   . Heart disease Mother   . Bladder Cancer Brother   . Cirrhosis Brother        Liver cirrhosis          Prior to Admission medications   Medication Sig Start Date End Date Taking? Authorizing Provider  albuterol (PROVENTIL) 2 MG tablet Take 2 mg by mouth 2 (two) times daily.   Yes [provider]  aspirin 81 MG chewable tablet Chew 81 mg by mouth daily.   Yes [provider]  clopidogrel (PLAVIX) 75 MG tablet Take 75 mg by mouth daily.   Yes [provider]  docusate sodium (COLACE) 100 MG capsule Take 1 capsule (  100 mg total) by mouth 2 (two) times daily. 12/31/16  Yes Glade Lloyd, MD  doxycycline (VIBRA-TABS) 100 MG tablet Take 100 mg by mouth 2 (two) times daily.   Yes [provider]  ENSURE (ENSURE) Take 237 mLs by mouth 2 (two) times daily between meals.   Yes [provider]  methocarbamol (ROBAXIN) 500 MG tablet Take 1 tablet (500 mg total) by mouth every 8 (eight) hours as needed for muscle spasms. 12/31/16  Yes Glade Lloyd, MD  Multiple  Vitamins-Minerals (CENTRUM SILVER PO) Take 1 tablet by mouth daily.    Yes [provider]  oxybutynin (DITROPAN) 5 MG tablet Take 5 mg by mouth every 8 (eight) hours as needed for bladder spasms.   Yes [provider]  oxyCODONE-acetaminophen (PERCOCET/ROXICET) 5-325 MG tablet Take 1 tablet by mouth every 4 (four) hours as needed for moderate pain. 12/31/16  Yes Glade Lloyd, MD  polyethylene glycol (MIRALAX / GLYCOLAX) packet Take 17 g by mouth daily as needed for mild constipation. 12/31/16  Yes Glade Lloyd, MD  senna (SENOKOT) 8.6 MG TABS tablet Take 1 tablet (8.6 mg total) by mouth 2 (two) times daily. 12/31/16  Yes Glade Lloyd, MD  sertraline (ZOLOFT) 25 MG tablet Take 25 mg by mouth daily.   Yes [provider]  simvastatin (ZOCOR) 40 MG tablet Take 40 mg by mouth daily.   Yes [provider]  vitamin B-12 (CYANOCOBALAMIN) 100 MCG tablet Take 100 mcg by mouth daily.   Yes [provider]     Physical Exam: Vitals:   01/29/17 0937 01/29/17 0940 01/29/17 1045 01/29/17 1324  BP:      Pulse:   63   Resp:   13   Temp:      TempSrc:      SpO2: 96%  98% 95%  Weight:  53.5 kg (118 lb)    Height:   (1.651 m)          Vitals:   01/29/17 0937 01/29/17 0940 01/29/17 1045 01/29/17 1324  BP:      Pulse:   63   Resp:   13   Temp:      TempSrc:      SpO2: 96%  98% 95%  Weight:  53.5 kg (118 lb)    Height:   (1.651 m)     Constitutional: NAD, calm, comfortable Eyes: PERRL, lids and conjunctivae normal ENMT: Mucous membranes are moist. Posterior pharynx clear of any exudate or lesions.Normal dentition.  Neck: normal, supple, no masses, no thyromegaly Pulmonary/Chest: Effort normal. She has wheezes. She has rales.  Diffuse inspiratory rhonchi and expiratory wheezes  Abdominal: Soft. Bowel sounds are normal. There is no tenderness. There is no rebound and no guarding.  Musculoskeletal: Normal range of motion. She exhibits no  edema or tenderness.  No midline thoracic or lumbar tenderness. No CVA tenderness. No lower sugary swelling, asymmetry or tenderness.  Neurological: She is alert.  Mild confusion.   Psychiatric: Normal judgment and insight. Alert and oriented x 3. Normal mood.     Labs on Admission: I have personally reviewed following labs and imaging studies  CBC:  Recent Labs Lab 01/29/17 1033  WBC 10.9*  NEUTROABS 8.9*  HGB 11.9*  HCT 37.4  MCV 95.9  PLT 284    Basic Metabolic Panel:  Recent Labs Lab 01/29/17 1033  NA 139  K 3.8  CL 106  CO2 26  GLUCOSE 102*  BUN 24*  CREATININE 0.82  CALCIUM 9.9    GFR: Estimated Creatinine Clearance: 35.4 mL/min (by C-G formula based on SCr of 0.82 mg/dL).  Liver Function Tests:  Recent Labs Lab 01/29/17 1033  AST 19  ALT 16  ALKPHOS 208*  BILITOT 0.4  PROT 6.5  ALBUMIN 3.1*   No results for input(s): LIPASE, AMYLASE in the last 168 hours. No results for input(s): AMMONIA in the last 168 hours.  Coagulation Profile: No results for input(s): INR, PROTIME in the last 168 hours. No results for input(s): DDIMER in the last 72 hours.  Cardiac Enzymes: No results for input(s): CKTOTAL, CKMB, CKMBINDEX, TROPONINI in the last 168 hours.  BNP (last 3 results) No results for input(s): PROBNP in the last 8760 hours.  HbA1C: No results for input(s): HGBA1C in the last 72 hours. Lab Results  Component Value Date   HGBA1C 5.5 08/03/2011     CBG: No results for input(s): GLUCAP in the last 168 hours.  Lipid Profile: No results for input(s): CHOL, HDL, LDLCALC, TRIG, CHOLHDL, LDLDIRECT in the last 72 hours.  Thyroid Function Tests: No results for input(s): TSH, T4TOTAL, FREET4, T3FREE, THYROIDAB in the last 72 hours.  Anemia Panel: No results for input(s): VITAMINB12, FOLATE, FERRITIN, TIBC, IRON, RETICCTPCT in the last 72 hours.  Urine analysis:    Component Value Date/Time   COLORURINE YELLOW 12/27/2016 0255    APPEARANCEUR CLOUDY (A) 12/27/2016 0255   LABSPEC 1.013 12/27/2016 0255   PHURINE 8.0 12/27/2016 0255   GLUCOSEU NEGATIVE 12/27/2016 0255   HGBUR NEGATIVE 12/27/2016 0255   BILIRUBINUR NEGATIVE 12/27/2016 0255   KETONESUR NEGATIVE 12/27/2016 0255   PROTEINUR 30 (A) 12/27/2016 0255   UROBILINOGEN 0.2 03/29/2015 0650   NITRITE NEGATIVE 12/27/2016 0255   LEUKOCYTESUR NEGATIVE 12/27/2016 0255    Sepsis Labs: (procalcitonin:4,lacticidven:4) )No results found for this or any previous visit (from the past 240 hour(s)).       Radiological Exams on Admission: Dg Chest 2 View  Result Date: 01/29/2017 CLINICAL DATA:  Chronic congestion, cough and wheezing. EXAM: CHEST  2 VIEW COMPARISON:  PA and lateral chest 01/22/2017 and 02/09/2016. CT chest 08/09/2011. FINDINGS: Small focus of linear scar or atelectasis in the lingula is noted. The lungs are otherwise clear. No pneumothorax or pleural effusion. Heart size is mildly enlarged. Hiatal hernia is identified. Atherosclerosis is seen. Remote surgical neck fracture left humerus is identified. There is also a remote healed fracture of the distal left clavicle. IMPRESSION: No acute disease. Atherosclerosis. Mild cardiomegaly. Electronically Signed   By: Drusilla Kanner M.D.   On: 01/29/2017 10:29   Dg Chest 2 View  Result Date: 01/29/2017 CLINICAL DATA:  Chronic congestion, cough and wheezing. EXAM: CHEST  2 VIEW COMPARISON:  PA and lateral chest 01/22/2017 and 02/09/2016. CT chest 08/09/2011. FINDINGS: Small focus of linear scar or atelectasis in the lingula is noted. The lungs are otherwise clear. No pneumothorax or pleural effusion. Heart size is mildly enlarged. Hiatal hernia is identified. Atherosclerosis is seen. Remote surgical neck fracture left humerus is identified. There is also a remote healed fracture of the distal left clavicle. IMPRESSION: No acute disease. Atherosclerosis. Mild cardiomegaly. Electronically Signed   By: Drusilla Kanner M.D.   On: 01/29/2017 10:29   Dg Chest 2 View  Result Date: 01/22/2017 CLINICAL DATA:  Cough, congestion for 1 week, shortness of breath EXAM: CHEST  2 VIEW COMPARISON:  Chest x-ray of 12/27/2016 and 02/06/2006 FINDINGS: The lungs remain slightly hyperaerated scarring bilaterally particularly in the lung bases.  No pneumonia is seen. A tiny left pleural effusion cannot be excluded. Changes due to an old left humeral neck fracture are noted. IMPRESSION: 1. Stable areas of scarring.  No definite active process. 2. Stable mild cardiomegaly.  Question small left pleural effusion. 3. Old healed left humeral neck fracture. Electronically Signed   By: Dwyane Dee M.D.   On: 01/22/2017 17:27      EKG: Independently reviewed.    Assessment/Plan Principal Problem:   COPD with acute bronchitis (HCC) Admit to telemetry Chest x-ray negative Check respiratory panel Empiric antibiotics nebulizer treatments,mucinex slp eval given history of CVA   Constipation Will start  miralax      Hx of stroke:with left sided hemiparesis Continue Zocor Continue Plavix   HLD: -zocor  Depression and anxiety:. -Continue Zoloft  Thrombocytopenia Resolved       DVT prophylaxis: Lovenox     Code Status Orders full code       consults called:  Family Communication: Admission, patients condition and plan of care including tests being ordered have been discussed with the patient  who indicates understanding and agree with the plan and Code Status  Admission status: inpatient    Disposition plan: Further plan will depend as patient's clinical course evolves and further radiologic and laboratory data become available. Likely home when stable   At the time of admission, it appears that the appropriate admission status for this patient is INPATIENT .Thisis judged to be reasonable and necessary in order to provide the required intensity of service to ensure the patient's safetygiven  thepresenting symptoms, physical exam findings, and initial radiographic and laboratory data in the context of their chronic comorbidities.   Richarda Overlie MD [facility] Pager [phone]  If 7PM-7AM, please contact night-coverage [email or web address] Password [facility]  01/29/2017, 3:19 PM

## 2017-01-29 NOTE — ED Triage Notes (Signed)
Per EMS they were called out for breathing problems.  Pt fell three weeks ago and pt is now at [facility] for rehabilitation.  EMS states that pts O2 saturation is 96% and upper lung sounds are congested.  Pt does have a congested cough.  Pt is currently taking albuterol and doxycycline.  BP 180/72 HR 88 RR 20

## 2017-01-29 NOTE — ED Notes (Signed)
Attempted to give report 

## 2017-01-30 ENCOUNTER — Inpatient Hospital Stay (HOSPITAL_COMMUNITY): Payer: Medicare Other

## 2017-01-30 DIAGNOSIS — I10 Essential (primary) hypertension: Secondary | ICD-10-CM

## 2017-01-30 DIAGNOSIS — J440 Chronic obstructive pulmonary disease with acute lower respiratory infection: Principal | ICD-10-CM

## 2017-01-30 DIAGNOSIS — J209 Acute bronchitis, unspecified: Secondary | ICD-10-CM

## 2017-01-30 DIAGNOSIS — I351 Nonrheumatic aortic (valve) insufficiency: Secondary | ICD-10-CM

## 2017-01-30 DIAGNOSIS — R0600 Dyspnea, unspecified: Secondary | ICD-10-CM

## 2017-01-30 LAB — COMPREHENSIVE METABOLIC PANEL
ALT: 12 U/L — ABNORMAL LOW (ref 14–54)
AST: 20 U/L (ref 15–41)
Albumin: 2.9 g/dL — ABNORMAL LOW (ref 3.5–5.0)
Alkaline Phosphatase: 192 U/L — ABNORMAL HIGH (ref 38–126)
Anion gap: 7 (ref 5–15)
BUN: 30 mg/dL — ABNORMAL HIGH (ref 6–20)
CO2: 26 mmol/L (ref 22–32)
Calcium: 9.7 mg/dL (ref 8.9–10.3)
Chloride: 106 mmol/L (ref 101–111)
Creatinine, Ser: 0.97 mg/dL (ref 0.44–1.00)
GFR calc Af Amer: 56 mL/min — ABNORMAL LOW (ref >60)
GFR calc non Af Amer: 48 mL/min — ABNORMAL LOW (ref >60)
Glucose, Bld: 133 mg/dL — ABNORMAL HIGH (ref 65–99)
Potassium: 4.1 mmol/L (ref 3.5–5.1)
Sodium: 139 mmol/L (ref 135–145)
Total Bilirubin: 0.4 mg/dL (ref 0.3–1.2)
Total Protein: 6.1 g/dL — ABNORMAL LOW (ref 6.5–8.1)

## 2017-01-30 LAB — RESPIRATORY PANEL BY PCR
ADENOVIRUS-RVPPCR: NOT DETECTED
Bordetella pertussis: NOT DETECTED
CORONAVIRUS 229E-RVPPCR: NOT DETECTED
CORONAVIRUS HKU1-RVPPCR: NOT DETECTED
CORONAVIRUS NL63-RVPPCR: NOT DETECTED
CORONAVIRUS OC43-RVPPCR: NOT DETECTED
Chlamydophila pneumoniae: NOT DETECTED
INFLUENZA A H3-RVPPCR: NOT DETECTED
INFLUENZA A-RVPPCR: NOT DETECTED
INFLUENZA B-RVPPCR: NOT DETECTED
Influenza A H1 2009: NOT DETECTED
Influenza A H1: NOT DETECTED
MYCOPLASMA PNEUMONIAE-RVPPCR: NOT DETECTED
Metapneumovirus: NOT DETECTED
PARAINFLUENZA VIRUS 1-RVPPCR: NOT DETECTED
PARAINFLUENZA VIRUS 4-RVPPCR: NOT DETECTED
Parainfluenza Virus 2: NOT DETECTED
Parainfluenza Virus 3: NOT DETECTED
Respiratory Syncytial Virus: NOT DETECTED
Rhinovirus / Enterovirus: DETECTED — AB

## 2017-01-30 LAB — CBC
HCT: 35.3 % — ABNORMAL LOW (ref 36.0–46.0)
Hemoglobin: 11.4 g/dL — ABNORMAL LOW (ref 12.0–15.0)
MCH: 30.9 pg (ref 26.0–34.0)
MCHC: 32.3 g/dL (ref 30.0–36.0)
MCV: 95.7 fL (ref 78.0–100.0)
PLATELETS: 314 10*3/uL (ref 150–400)
RBC: 3.69 MIL/uL — ABNORMAL LOW (ref 3.87–5.11)
RDW: 14.3 % (ref 11.5–15.5)
WBC: 12.7 10*3/uL — ABNORMAL HIGH (ref 4.0–10.5)

## 2017-01-30 LAB — ECHOCARDIOGRAM COMPLETE
Height: 65 in
Weight: 1873.03 oz

## 2017-01-30 LAB — MRSA PCR SCREENING: MRSA by PCR: NEGATIVE

## 2017-01-30 MED ORDER — LEVOFLOXACIN IN D5W 500 MG/100ML IV SOLN
500.0000 mg | INTRAVENOUS | Status: DC
  Administered 2017-01-31: 500 mg via INTRAVENOUS
  Filled 2017-01-30 (×2): qty 100

## 2017-01-30 NOTE — Progress Notes (Signed)
PROGRESS NOTE                                                                                                                                                                                                             Patient Demographics:    Debbie Webb, is a 83 y.o. female, DOB - 1920/07/29, ZOX:[id number]  Admit date - 01/29/2017   Admitting Physician Richarda Overlie, MD  Outpatient Primary MD for the patient is [name], MD  LOS - 1  Outpatient Specialists:  Chief Complaint  Patient presents with  . Wheezing       Brief Narrative  83 year old female with CVA on Plavix, hypertension, carotid artery disease, recent left hip hemiarthroplasty on 01/01/2017 presented to the ED from [facility] assisted living with shortness of breath and wheezing. She was treated with albuterol and doxycycline at the facility without much relief. In the ED she was found to be wheezing and short of breath. Given nebs with IV Solu-Medrol and admitted to hospitalist service.   Subjective:   Patient informs her breathing to be slightly better today.   Assessment  & Plan :    Principal Problem:   COPD with acute bronchitis (HCC) Continue IV Solu-Medrol twice a day, Xopenex nebs scheduled and when necessary. Continue empiric Levaquin.  Active Problems:   Hypokalemia Replenished.    Personal history of stroke with residual effects Continue Plavix and statin.  Recent left femoral neck fracture Underwent hemiarthroplasty on/17/2018 and was discharged to a SNF. Patient has been discharged from SNF to assisted living 9/5.  Recent Left humeral neck fracture Secondary to mechanical fall. Plan on conservative management per orthopedics. Continue sling.  Essential hypertension Continue HCTZ and amlodipine.  Anxiety and depression Continue Zoloft     Code Status : Full code  Family Communication  : None at bedside  Disposition  Plan  : Obtain PT eval in the morning. Possibly discharge to assisted living tomorrow if improved.  Barriers For Discharge : Improving symptoms  Consults  :  None  Procedures  none  DVT Prophylaxis  :  Lovenox -   Lab Results  Component Value Date   PLT 314 01/30/2017    Antibiotics  :    Anti-infectives    Start     Dose/Rate Route Frequency Ordered Stop   01/31/17 1000  levofloxacin (  LEVAQUIN) IVPB 500 mg     500 mg 100 mL/hr over 60 Minutes Intravenous Every 48 hours 01/30/17 1201     01/30/17 1200  levofloxacin (LEVAQUIN) IVPB 500 mg  Status:  Discontinued     500 mg 100 mL/hr over 60 Minutes Intravenous Every 24 hours 01/29/17 1521 01/30/17 1201   01/29/17 1515  levofloxacin (LEVAQUIN) IVPB 500 mg  Status:  Discontinued     500 mg 100 mL/hr over 60 Minutes Intravenous Every 24 hours 01/29/17 1500 01/29/17 1516   01/29/17 1215  levofloxacin (LEVAQUIN) IVPB 500 mg     500 mg 100 mL/hr over 60 Minutes Intravenous  Once 01/29/17 1209 01/29/17 1505        Objective:   Vitals:   01/30/17 0604 01/30/17 0749 01/30/17 1400 01/30/17 1428  BP: 132/64   (!) 156/42  Pulse: 82   61  Resp: 20   16  Temp: 98.4 F (36.9 C)   98.2 F (36.8 C)  TempSrc: Oral   Oral  SpO2: 95% 94% 96% 96%  Weight: 53.1 kg (117 lb 1 oz)     Height:        Wt Readings from Last 3 Encounters:  01/30/17 53.1 kg (117 lb 1 oz)  12/28/16 56.2 kg (124 lb)  11/12/16 53.9 kg (118 lb 14.4 oz)     Intake/Output Summary (Last 24 hours) at 01/30/17 1741 Last data filed at 01/30/17 1400  Gross per 24 hour  Intake              720 ml  Output              100 ml  Net              620 ml     Physical Exam  Gen: not in distress HEENT:  moist mucosa, supple neck Chest: Scattered rhonchi laterally CVS: N S1&S2, no murmurs,  GI: soft, NT, ND, bowel sounds present Musculoskeletal: warm, no edema, left arm sling     Data Review:    CBC  Recent Labs Lab 01/29/17 1033 01/30/17 0606  WBC  10.9* 12.7*  HGB 11.9* 11.4*  HCT 37.4 35.3*  PLT 284 314  MCV 95.9 95.7  MCH 30.5 30.9  MCHC 31.8 32.3  RDW 14.1 14.3  LYMPHSABS 1.0  --   MONOABS 0.7  --   EOSABS 0.3  --   BASOSABS 0.0  --     Chemistries   Recent Labs Lab 01/29/17 1033 01/29/17 1516 01/30/17 0606  NA 139  --  139  K 3.8  --  4.1  CL 106  --  106  CO2 26  --  26  GLUCOSE 102*  --  133*  BUN 24*  --  30*  CREATININE 0.82  --  0.97  CALCIUM 9.9  --  9.7  MG  --  1.8  --   AST 19  --  20  ALT 16  --  12*  ALKPHOS 208*  --  192*  BILITOT 0.4  --  0.4   ------------------------------------------------------------------------------------------------------------------ No results for input(s): CHOL, HDL, LDLCALC, TRIG, CHOLHDL, LDLDIRECT in the last 72 hours.  Lab Results  Component Value Date   HGBA1C 5.5 08/03/2011   ------------------------------------------------------------------------------------------------------------------ No results for input(s): TSH, T4TOTAL, T3FREE, THYROIDAB in the last 72 hours.  Invalid input(s): FREET3 ------------------------------------------------------------------------------------------------------------------ No results for input(s): VITAMINB12, FOLATE, FERRITIN, TIBC, IRON, RETICCTPCT in the last 72 hours.  Coagulation profile No results for input(s):  INR, PROTIME in the last 168 hours.  No results for input(s): DDIMER in the last 72 hours.  Cardiac Enzymes No results for input(s): CKMB, TROPONINI, MYOGLOBIN in the last 168 hours.  Invalid input(s): CK ------------------------------------------------------------------------------------------------------------------    Component Value Date/Time   BNP 101.0 (H) 12/27/2016 0301    Inpatient Medications  Scheduled Meds: . albuterol  2 mg Oral BID  . aspirin  81 mg Oral Daily  . clopidogrel  75 mg Oral Daily  . enoxaparin (LOVENOX) injection  30 mg Subcutaneous Q24H  . feeding supplement (ENSURE  ENLIVE)  237 mL Oral BID BM  . levalbuterol  1.25 mg Nebulization TID  . methylPREDNISolone (SOLU-MEDROL) injection  60 mg Intravenous Q12H  . polyethylene glycol  17 g Oral Daily  . senna  1 tablet Oral BID  . sertraline  25 mg Oral Daily  . simvastatin  40 mg Oral q1800  . vitamin B-12  100 mcg Oral Daily   Continuous Infusions: . [START ON 01/31/2017] levofloxacin (LEVAQUIN) IV     PRN Meds:.levalbuterol, methocarbamol, ondansetron **OR** ondansetron (ZOFRAN) IV, oxybutynin, oxyCODONE-acetaminophen, polyethylene glycol  Micro Results Recent Results (from the past 240 hour(s))  MRSA PCR Screening     Status: None   Collection Time: 01/30/17  3:49 AM  Result Value Ref Range Status   MRSA by PCR NEGATIVE NEGATIVE Final    Comment:        The GeneXpert MRSA Assay (FDA approved for NASAL specimens only), is one component of a comprehensive MRSA colonization surveillance program. It is not intended to diagnose MRSA infection nor to guide or monitor treatment for MRSA infections.     Radiology Reports Dg Chest 2 View  Result Date: 01/29/2017 CLINICAL DATA:  Chronic congestion, cough and wheezing. EXAM: CHEST  2 VIEW COMPARISON:  PA and lateral chest 01/22/2017 and 02/09/2016. CT chest 08/09/2011. FINDINGS: Small focus of linear scar or atelectasis in the lingula is noted. The lungs are otherwise clear. No pneumothorax or pleural effusion. Heart size is mildly enlarged. Hiatal hernia is identified. Atherosclerosis is seen. Remote surgical neck fracture left humerus is identified. There is also a remote healed fracture of the distal left clavicle. IMPRESSION: No acute disease. Atherosclerosis. Mild cardiomegaly. Electronically Signed   By: Drusilla Kanner M.D.   On: 01/29/2017 10:29   Dg Chest 2 View  Result Date: 01/22/2017 CLINICAL DATA:  Cough, congestion for 1 week, shortness of breath EXAM: CHEST  2 VIEW COMPARISON:  Chest x-ray of 12/27/2016 and 02/06/2006 FINDINGS: The lungs  remain slightly hyperaerated scarring bilaterally particularly in the lung bases. No pneumonia is seen. A tiny left pleural effusion cannot be excluded. Changes due to an old left humeral neck fracture are noted. IMPRESSION: 1. Stable areas of scarring.  No definite active process. 2. Stable mild cardiomegaly.  Question small left pleural effusion. 3. Old healed left humeral neck fracture. Electronically Signed   By: Dwyane Dee M.D.   On: 01/22/2017 17:27    Time Spent in minutes  25   Eddie North M.D on 01/30/2017 at 5:41 PM  Between 7am to 7pm - Pager - [phone]  After 7pm go to [email or web address] - password [facility] [address]  [facility] -  Office  [phone]

## 2017-01-30 NOTE — Progress Notes (Signed)
Initial Nutrition Assessment     INTERVENTION:  Ensure Enlive po BID, each supplement provides 350 kcal and 20 grams of protein   Provide tray-set-up every meal to enable maximum intake  Nutrition services will obtain food preferences    NUTRITION DIAGNOSIS:   Increased nutrient needs related to acute respiratory illness as evidenced by estimated needs.   GOAL:   Patient will meet greater than or equal to 90% of their needs   MONITOR:   PO intake, Supplement acceptance, Skin, Weight trends  REASON FOR ASSESSMENT:   Malnutrition Screening Tool    ASSESSMENT: Debbie Webb is a very pleasant 83 yo who is HOH. She has a hx of DM, Stroke (hemiparesis), and presents with acute respiratory illness. She self-reports many falls and fractured her left femoral neck and shoulder in August.  She is just completing her breakfast and ate 50-75% of her meal. Her appetite "isn't great" and also says foods just don't taste as good as they used to. She drinks 2 Ensures daily to supplements her diminished appetite. Her mobility is limited to wheelchair mostly and could potentially impact her overall desire to eat. She is able to feed herself though her left arm is still in a sling following her injury in August.   Her weight is down a few pounds but within her range of 54-56 kg.  Nutrition-Focused physical exam completed. Findings are mild fat depletion, mild-moderate muscle depletion, and no edema. Muscle loss expected given her advanced years and limited mobility.    Recent Labs Lab 01/29/17 1033 01/29/17 1516 01/30/17 0606  NA 139  --  139  K 3.8  --  4.1  CL 106  --  106  CO2 26  --  26  BUN 24*  --  30*  CREATININE 0.82  --  0.97  CALCIUM 9.9  --  9.7  MG  --  1.8  --   GLUCOSE 102*  --  133*   Labs: BUN 30, Glu 133  Meds: Miralax, zoloft, B-12  Diet Order:  Diet Heart Room service appropriate? Yes; Fluid consistency: Thin  Skin:  Reviewed, no issues  Last BM:  9/18  Height:    Ht Readings from Last 1 Encounters:  01/29/17  (1.651 m)    Weight:   Wt Readings from Last 1 Encounters:  01/30/17 117 lb 1 oz (53.1 kg)    Ideal Body Weight:  57 kg  BMI:  Body mass index is 19.48 kg/m.  Estimated Nutritional Needs:   Kcal:  1400-1590 (to prevent wt loss)  Protein:  64-70 gr (1.2-1.3 gr/kg)  Fluid:  1.3 liters daily  EDUCATION NEEDS:   Education needs no appropriate at this time  Royann Shivers Debbie,RD,CSG,LDN Office: [phone] Pager: [phone]

## 2017-01-30 NOTE — Progress Notes (Signed)
*  PRELIMINARY RESULTS* Echocardiogram 2D Echocardiogram has been performed.  Jeryl Columbia 01/30/2017, 3:30 PM

## 2017-01-31 DIAGNOSIS — E876 Hypokalemia: Secondary | ICD-10-CM

## 2017-01-31 DIAGNOSIS — Z96649 Presence of unspecified artificial hip joint: Secondary | ICD-10-CM

## 2017-01-31 MED ORDER — GUAIFENESIN-DM 100-10 MG/5ML PO SYRP: 5.0000 mL | ORAL_SOLUTION | ORAL | 0 refills | Status: DC | PRN

## 2017-01-31 MED ORDER — LEVOFLOXACIN 500 MG PO TABS: 500.0000 mg | ORAL_TABLET | Freq: Every day | ORAL | 0 refills | Status: AC

## 2017-01-31 MED ORDER — PREDNISONE 20 MG PO TABS: 20.0000 mg | ORAL_TABLET | Freq: Every day | ORAL | 0 refills | Status: DC

## 2017-01-31 NOTE — Clinical Social Work Note (Signed)
Clinical Social Work Assessment  Patient Details  Name: Debbie Webb MRN: [id number] Date of Birth: 13-Jan-1921  Date of referral:  01/31/17               Reason for consult:  Discharge Planning                Permission sought to share information with:    Permission granted to share information::     Name::        Agency::  Juliette Alcide, nurse at [facility].   Relationship::     Contact Information:  [name], son, listed on chart.   Housing/Transportation Living arrangements for the past 2 months:  Assisted Press photographer, Skilled Nursing Facility Source of Information:  Facility, Adult Children Patient Interpreter Needed:  None Criminal Activity/Legal Involvement Pertinent to Current Situation/Hospitalization:  No - Comment as needed Significant Relationships:  None Lives with:  Facility Resident Do you feel safe going back to the place where you live?  Yes Need for family participation in patient care:  Yes (Comment)  Care giving concerns:  None identified.    Social Worker assessment / plan:  LCSW spoke with Juliette Alcide, Engineer, civil (consulting) at [facility]. Patient came to facility after completing short term rehab at [facility]. Patient receives assistance with bvathing, showering and dressing. She feed herself and assists with dressing. Patient ambulates short distances with a walker and longer distances in a wheelchair.  Patient can return the the facility. LCSW advised that patient was discharging today and reviewed PT evaluation. Facility to pick patient up.  Patient's son, [name], confirmed statements and advised that he desired for patient to return at discharge. LCSW advised that patient was discharging today.    Employment status:  Retired Database administrator PT Recommendations:  Skilled Nursing Facility Information / Referral to community resources:     Patient/Family's Response to care:  Family is agreeable for patient to return to [facility] at discharge.    Patient/Family's Understanding of and Emotional Response to Diagnosis, Current Treatment, and Prognosis:  Family understands patient's diagnosis, prognosis, and treatment.   Emotional Assessment Appearance:  Appears stated age Attitude/Demeanor/Rapport:    Affect (typically observed):  Accepting, Calm Orientation:  Oriented to Situation, Oriented to  Time, Oriented to Place, Oriented to Self Alcohol / Substance use:  Not Applicable Psych involvement (Current and /or in the community):  No (Comment)  Discharge Needs  Concerns to be addressed:  No discharge needs identified Readmission within the last 30 days:  No Current discharge risk:  None Barriers to Discharge:  No Barriers Identified   Annice Needy, LCSW 01/31/2017, 1:29 PM

## 2017-01-31 NOTE — Discharge Summary (Signed)
Physician Discharge Summary  Debbie Webb:[id number] DOB: Jan 14, 1921 DOA: 01/29/2017  PCP: [name], MD  Admit date: 01/29/2017 Discharge date: 01/31/2017  Admitted From: Assisted living Disposition: Assisted living  Recommendations for Outpatient Follow-up:  1. Follow up with PCP in 1-2 weeks 2. Patient will complete 5 day course of antibiotic on 9/23 3. Patient complete oral prednisone taper on 10/1  Home Health: Patient is active with PT at the facility Equipment/Devices: As per therapy at the facility   Discharge Condition: Fair CODE STATUS: Full code Diet recommendation: Regular    Discharge Diagnoses:  Principal Problem:   COPD with acute bronchitis (HCC)   Active Problems:   Hypokalemia   Personal history of stroke with residual effects   GERD (gastroesophageal reflux disease)   Essential hypertension, benign   Depression with anxiety   S/P left hip hemiarthroplasty  Brief narrative/history of present illness Debbie Webb with CVA on Plavix, hypertension, carotid artery disease, recent left hip hemiarthroplasty on 01/01/2017 presented to the ED from [facility] assisted living with shortness of breath and wheezing. She was treated with albuterol and doxycycline at the facility without much relief. In the ED she was found to be wheezing and short of breath. Given nebs with IV Solu-Medrol and admitted to hospitalist service.  Hospital course   Principal Problem:   COPD with acute bronchitis (HCC) Placed on IV Solu-Medrol, nebs as needed and empiric Levaquin. Symptoms much improved. Chest x-ray negative for infiltrate. Will discharge her on oral prednisone taper over the next 10-12 days and complete total 5 day course of Levaquin. Follow-up with PCP in 1 week.  Active Problems:   Hypokalemia Replenished.    Personal history of stroke with residual effects Continue Plavix and statin.  Recent left femoral neck fracture Underwent  hemiarthroplasty on/17/2018 and was discharged to a SNF. Patient has been discharged from SNF to assisted living 9/5. Continue PT at the facility.  Recent Left humeral neck fracture Secondary to mechanical fall. Plan on conservative management per orthopedics. Continue sling.  Essential hypertension Continue HCTZ and amlodipine.  Anxiety and depression Continue Zoloft      Family Communication  :  son at bedside  Disposition Plan  :  return to assisted living  Consults  :  None  Procedures  none   Discharge Instructions   Allergies as of 01/31/2017      Reactions   No Known Allergies       Medication List    STOP taking these medications   doxycycline 100 MG tablet Commonly known as:  VIBRA-TABS     TAKE these medications   albuterol 2 MG tablet Commonly known as:  PROVENTIL Take 2 mg by mouth 2 (two) times daily.   aspirin Debbie MG chewable tablet Chew Debbie mg by mouth daily.   CENTRUM SILVER PO Take 1 tablet by mouth daily.   clopidogrel 75 MG tablet Commonly known as:  PLAVIX Take 75 mg by mouth daily.   docusate sodium 100 MG capsule Commonly known as:  COLACE Take 1 capsule (100 mg total) by mouth 2 (two) times daily.   ENSURE Take 237 mLs by mouth 2 (two) times daily between meals.   guaiFENesin-dextromethorphan 100-10 MG/5ML syrup Commonly known as:  ROBITUSSIN DM Take 5 mLs by mouth every 4 (four) hours as needed for cough.   levofloxacin 500 MG tablet Commonly known as:  LEVAQUIN Take 1 tablet (500 mg total) by mouth daily.   methocarbamol 500 MG tablet Commonly known  as:  ROBAXIN Take 1 tablet (500 mg total) by mouth every 8 (eight) hours as needed for muscle spasms.   oxybutynin 5 MG tablet Commonly known as:  DITROPAN Take 5 mg by mouth every 8 (eight) hours as needed for bladder spasms.   oxyCODONE-acetaminophen 5-325 MG tablet Commonly known as:  PERCOCET/ROXICET Take 1 tablet by mouth every 4 (four) hours as needed for  moderate pain.   polyethylene glycol packet Commonly known as:  MIRALAX / GLYCOLAX Take 17 g by mouth daily as needed for mild constipation.   predniSONE 20 MG tablet Commonly known as:  DELTASONE Take 1 tablet (20 mg total) by mouth daily with breakfast.   senna 8.6 MG Tabs tablet Commonly known as:  SENOKOT Take 1 tablet (8.6 mg total) by mouth 2 (two) times daily.   sertraline 25 MG tablet Commonly known as:  ZOLOFT Take 25 mg by mouth daily.   simvastatin 40 MG tablet Commonly known as:  ZOCOR Take 40 mg by mouth daily.   vitamin B-12 100 MCG tablet Commonly known as:  CYANOCOBALAMIN Take 100 mcg by mouth daily.            Discharge Care Instructions        Start     Ordered   02/01/17 0000  levofloxacin (LEVAQUIN) 500 MG tablet  Daily     01/31/17 1145   01/31/17 0000  predniSONE (DELTASONE) 20 MG tablet  Daily with breakfast    Comments:  Take 40 mg daily for 3 days, then 30 mg daily for next 3 days, then 20 mg daily for next 3 days , then 10 mg daily for next 3 days then stop.   01/31/17 1145   01/31/17 0000  guaiFENesin-dextromethorphan (ROBITUSSIN DM) 100-10 MG/5ML syrup  Every 4 hours PRN     01/31/17 1145     Follow-up Information    [name], MD. Schedule an appointment as soon as possible for a visit in 1 week(s).   Specialty:  Internal Medicine Contact information: [address] [place] [address] [phone]          Allergies  Allergen Reactions  . No Known Allergies       Procedures/Studies: Dg Chest 2 View  Result Date: 01/29/2017 CLINICAL DATA:  Chronic congestion, cough and wheezing. EXAM: CHEST  2 VIEW COMPARISON:  PA and lateral chest 01/22/2017 and 02/09/2016. CT chest 08/09/2011. FINDINGS: Small focus of linear scar or atelectasis in the lingula is noted. The lungs are otherwise clear. No pneumothorax or pleural effusion. Heart size is mildly enlarged. Hiatal hernia is identified. Atherosclerosis is seen.  Remote surgical neck fracture left humerus is identified. There is also a remote healed fracture of the distal left clavicle. IMPRESSION: No acute disease. Atherosclerosis. Mild cardiomegaly. Electronically Signed   By: Drusilla Kanner M.D.   On: 01/29/2017 10:29   Dg Chest 2 View  Result Date: 01/22/2017 CLINICAL DATA:  Cough, congestion for 1 week, shortness of breath EXAM: CHEST  2 VIEW COMPARISON:  Chest x-ray of 12/27/2016 and 02/06/2006 FINDINGS: The lungs remain slightly hyperaerated scarring bilaterally particularly in the lung bases. No pneumonia is seen. A tiny left pleural effusion cannot be excluded. Changes due to an old left humeral neck fracture are noted. IMPRESSION: 1. Stable areas of scarring.  No definite active process. 2. Stable mild cardiomegaly.  Question small left pleural effusion. 3. Old healed left humeral neck fracture. Electronically Signed   By: Lucienne Minks.D.  On: 01/22/2017 17:27     2-D echo Study Conclusions  - Left ventricle: The cavity size was normal. Wall thickness was   increased in a pattern of mild LVH. Systolic function was normal.   The estimated ejection fraction was in the range of 60% to 65%.   Wall motion was normal; there were no regional wall motion   abnormalities. Doppler parameters are consistent with abnormal   left ventricular relaxation (grade 1 diastolic dysfunction). - Aortic valve: Moderately to severely calcified annulus.   Trileaflet; moderately thickened leaflets. There was mild   stenosis. Mean gradient (S): 10 mm Hg. Valve area (VTI): 1.54   cm^2. - Mitral valve: Mildly to moderately calcified annulus. Mildly   thickened leaflets . - Left atrium: The atrium was severely dilated. - Pulmonary arteries: Systolic pressure was mildly increased. PA   peak pressure: 36 mm Hg (S).  Subjective: Breathing much improved. Has some confusion which appears at baseline.Marland Kitchen  Discharge Exam: Vitals:   01/31/17 0459 01/31/17 0827  BP:  (!) 153/34   Pulse: 75   Resp: 20   Temp: 97.7 F (36.5 C)   SpO2: 97% 96%   Vitals:   01/30/17 2020 01/31/17 0100 01/31/17 0459 01/31/17 0827  BP: (!) 171/83  (!) 153/34   Pulse: (!) 41 85 75   Resp: 20  20   Temp: 98.5 F (36.9 C)  97.7 F (36.5 C)   TempSrc: Oral  Oral   SpO2: 95%  97% 96%  Weight:      Height:       Gen: not in distress HEENT:  moist mucosa, supple neck Chest: Clear breath sounds bilaterally CVS: N S1&S2, no murmurs,  GI: soft, NT, ND, bowel sounds present Musculoskeletal: warm, no edema, left arm sling     The results of significant diagnostics from this hospitalization (including imaging, microbiology, ancillary and laboratory) are listed below for reference.     Microbiology: Recent Results (from the past 240 hour(s))  Respiratory Panel by PCR     Status: Abnormal   Collection Time: 01/29/17  6:50 PM  Result Value Ref Range Status   Adenovirus NOT DETECTED NOT DETECTED Final   Coronavirus 229E NOT DETECTED NOT DETECTED Final   Coronavirus HKU1 NOT DETECTED NOT DETECTED Final   Coronavirus NL63 NOT DETECTED NOT DETECTED Final   Coronavirus OC43 NOT DETECTED NOT DETECTED Final   Metapneumovirus NOT DETECTED NOT DETECTED Final   Rhinovirus / Enterovirus DETECTED (A) NOT DETECTED Final   Influenza A NOT DETECTED NOT DETECTED Final   Influenza A H1 NOT DETECTED NOT DETECTED Final   Influenza A H1 2009 NOT DETECTED NOT DETECTED Final   Influenza A H3 NOT DETECTED NOT DETECTED Final   Influenza B NOT DETECTED NOT DETECTED Final   Parainfluenza Virus 1 NOT DETECTED NOT DETECTED Final   Parainfluenza Virus 2 NOT DETECTED NOT DETECTED Final   Parainfluenza Virus 3 NOT DETECTED NOT DETECTED Final   Parainfluenza Virus 4 NOT DETECTED NOT DETECTED Final   Respiratory Syncytial Virus NOT DETECTED NOT DETECTED Final   Bordetella pertussis NOT DETECTED NOT DETECTED Final   Chlamydophila pneumoniae NOT DETECTED NOT DETECTED Final   Mycoplasma  pneumoniae NOT DETECTED NOT DETECTED Final    Comment: Performed at [facility], [address], [place], [place] [address]  MRSA PCR Screening     Status: None   Collection Time: 01/30/17  3:49 AM  Result Value Ref Range Status   MRSA by  PCR NEGATIVE NEGATIVE Final    Comment:        The GeneXpert MRSA Assay (FDA approved for NASAL specimens only), is one component of a comprehensive MRSA colonization surveillance program. It is not intended to diagnose MRSA infection nor to guide or monitor treatment for MRSA infections.      Labs: BNP (last 3 results)  Recent Labs  12/27/16 0301  BNP 101.0*   Basic Metabolic Panel:  Recent Labs Lab 01/29/17 1033 01/29/17 1516 01/30/17 0606  NA 139  --  139  K 3.8  --  4.1  CL 106  --  106  CO2 26  --  26  GLUCOSE 102*  --  133*  BUN 24*  --  30*  CREATININE 0.82  --  0.97  CALCIUM 9.9  --  9.7  MG  --  1.8  --    Liver Function Tests:  Recent Labs Lab 01/29/17 1033 01/30/17 0606  AST 19 20  ALT 16 12*  ALKPHOS 208* 192*  BILITOT 0.4 0.4  PROT 6.5 6.1*  ALBUMIN 3.1* 2.9*   No results for input(s): LIPASE, AMYLASE in the last 168 hours. No results for input(s): AMMONIA in the last 168 hours. CBC:  Recent Labs Lab 01/29/17 1033 01/30/17 0606  WBC 10.9* 12.7*  NEUTROABS 8.9*  --   HGB 11.9* 11.4*  HCT 37.4 35.3*  MCV 95.9 95.7  PLT 284 314   Cardiac Enzymes: No results for input(s): CKTOTAL, CKMB, CKMBINDEX, TROPONINI in the last 168 hours. BNP: Invalid input(s): POCBNP CBG: No results for input(s): GLUCAP in the last 168 hours. D-Dimer No results for input(s): DDIMER in the last 72 hours. Hgb A1c No results for input(s): HGBA1C in the last 72 hours. Lipid Profile No results for input(s): CHOL, HDL, LDLCALC, TRIG, CHOLHDL, LDLDIRECT in the last 72 hours. Thyroid function studies No results for input(s): TSH, T4TOTAL, T3FREE, THYROIDAB in the last 72 hours.  Invalid input(s): FREET3 Anemia  work up No results for input(s): VITAMINB12, FOLATE, FERRITIN, TIBC, IRON, RETICCTPCT in the last 72 hours. Urinalysis    Component Value Date/Time   COLORURINE YELLOW 12/27/2016 0255   APPEARANCEUR CLOUDY (A) 12/27/2016 0255   LABSPEC 1.013 12/27/2016 0255   PHURINE 8.0 12/27/2016 0255   GLUCOSEU NEGATIVE 12/27/2016 0255   HGBUR NEGATIVE 12/27/2016 0255   BILIRUBINUR NEGATIVE 12/27/2016 0255   KETONESUR NEGATIVE 12/27/2016 0255   PROTEINUR 30 (A) 12/27/2016 0255   UROBILINOGEN 0.2 03/29/2015 0650   NITRITE NEGATIVE 12/27/2016 0255   LEUKOCYTESUR NEGATIVE 12/27/2016 0255   Sepsis Labs Invalid input(s): PROCALCITONIN,  WBC,  LACTICIDVEN Microbiology Recent Results (from the past 240 hour(s))  Respiratory Panel by PCR     Status: Abnormal   Collection Time: 01/29/17  6:50 PM  Result Value Ref Range Status   Adenovirus NOT DETECTED NOT DETECTED Final   Coronavirus 229E NOT DETECTED NOT DETECTED Final   Coronavirus HKU1 NOT DETECTED NOT DETECTED Final   Coronavirus NL63 NOT DETECTED NOT DETECTED Final   Coronavirus OC43 NOT DETECTED NOT DETECTED Final   Metapneumovirus NOT DETECTED NOT DETECTED Final   Rhinovirus / Enterovirus DETECTED (A) NOT DETECTED Final   Influenza A NOT DETECTED NOT DETECTED Final   Influenza A H1 NOT DETECTED NOT DETECTED Final   Influenza A H1 2009 NOT DETECTED NOT DETECTED Final   Influenza A H3 NOT DETECTED NOT DETECTED Final   Influenza B NOT DETECTED NOT DETECTED Final   Parainfluenza Virus 1 NOT DETECTED  NOT DETECTED Final   Parainfluenza Virus 2 NOT DETECTED NOT DETECTED Final   Parainfluenza Virus 3 NOT DETECTED NOT DETECTED Final   Parainfluenza Virus 4 NOT DETECTED NOT DETECTED Final   Respiratory Syncytial Virus NOT DETECTED NOT DETECTED Final   Bordetella pertussis NOT DETECTED NOT DETECTED Final   Chlamydophila pneumoniae NOT DETECTED NOT DETECTED Final   Mycoplasma pneumoniae NOT DETECTED NOT DETECTED Final    Comment: Performed at [facility], [address], [place], [place] [address]  MRSA PCR Screening     Status: None   Collection Time: 01/30/17  3:49 AM  Result Value Ref Range Status   MRSA by PCR NEGATIVE NEGATIVE Final    Comment:        The GeneXpert MRSA Assay (FDA approved for NASAL specimens only), is one component of a comprehensive MRSA colonization surveillance program. It is not intended to diagnose MRSA infection nor to guide or monitor treatment for MRSA infections.      Time coordinating discharge: <30 minutes  SIGNED:   Eddie North, MD  [facility] 01/31/2017, 11:46 AM Pager   If 7PM-7AM, please contact night-coverage [email or web address] Password [email or web address]

## 2017-01-31 NOTE — Progress Notes (Signed)
Discharged to [facility] with staff Discharged instructions read to patient and family Both verbalized understanding of all instruction

## 2017-01-31 NOTE — Care Management Important Message (Signed)
Important Message  Patient Details  Name: Debbie Webb MRN: [id number] Date of Birth: 1920-12-24   Medicare Important Message Given:  Yes    Malcolm Metro, RN 01/31/2017, 2:04 PM

## 2017-01-31 NOTE — Evaluation (Signed)
Physical Therapy Evaluation Patient Details Name: CLORIA CIRESI MRN: [id number] DOB: 11/29/1920 Today's Date: 01/31/2017   History of Present Illness  83 year old female with a history of carotid artery disease, CVA on   Plavix hypertension, previous history of stroke, recent left hip hemiarthroplasty on 01/01/17 , benign postoperative course except for some anemia and thrombocytopenia,   presents to the ED today from [facility] rehabilitation, with breathing issues,. Patient is a poor historian. Patient is being treated for acute bronchitis with albuterol and doxycycline at SNF. She has had some shortness of breath with wheezing noted by the nursing staff    Clinical Impression  Patient require use of Hemi-walker to comply with NWB LUE secondary to tendency to lean on LUE when attempting to use a RW.  Patient able to ambulate in room, limited mostly due to fatigue, sat up in chair and later transferred back to bed.  Patient limited for functional mobility as stated below secondary to BLE weakness, fatigue and poor standing balance.  Patient will benefit from continued physical therapy in hospital and recommended venue below to increase strength, balance, endurance for safe ADLs and gait.    Follow Up Recommendations SNF;Supervision/Assistance - 24 hour    Equipment Recommendations       Recommendations for Other Services OT consult     Precautions / Restrictions Precautions Precautions: Fall Required Braces or Orthoses: Sling Restrictions Weight Bearing Restrictions: Yes (NWB LUE, WBAT LLE) Other Position/Activity Restrictions: Sling left shoulder      Mobility  Bed Mobility Overal bed mobility: Needs Assistance Bed Mobility: Sit to Supine;Supine to Sit     Supine to sit: Min assist;Mod assist Sit to supine: Min assist;Mod assist      Transfers Overall transfer level: Needs assistance Equipment used: Hemi-walker;Rolling walker (2 wheeled) Transfers: Sit to/from [employer] Sit to Stand: Min assist;Mod assist Stand pivot transfers: Min assist;Mod assist          Ambulation/Gait Ambulation/Gait assistance: Min assist;Mod assist Ambulation Distance (Feet): 15 Feet Assistive device: Rolling walker (2 wheeled);Hemi-walker Gait Pattern/deviations: Decreased step length - right;Decreased step length - left;Decreased stance time - left;Decreased stride length   Gait velocity interpretation: Below normal speed for age/gender General Gait Details: Patient demonstrates poor return for keeping weight off LUE when using RW, tends to grip RW lightly with left hand, had to use Hemi-walker with RUE demonstrating slow labored cadence with 3 point gait pattern without loss of balance  Stairs            Wheelchair Mobility    Modified Rankin (Stroke Patients Only)       Balance Overall balance assessment: Needs assistance Sitting-balance support: Bilateral upper extremity supported;Feet supported Sitting balance-Leahy Scale: Fair     Standing balance support: During functional activity;Single extremity supported (RUE, NWB LUE) Standing balance-Leahy Scale: Poor Standing balance comment: overall fair/poor using Hemi-walker or RW leaning on RUE                             Pertinent Vitals/Pain Pain Assessment: No/denies pain    Home Living Family/patient expects to be discharged to:: Private residence Living Arrangements: Alone Available Help at Discharge: Family (her son lives next door "per patient") Type of Home: House Home Access: Stairs to enter Entrance Stairs-Rails: Left Entrance Stairs-Number of Steps: 5 Home Layout: One level Home Equipment: Grab bars - toilet;Walker - 4 wheels      Prior Function Level of Independence:  Needs assistance   Gait / Transfers Assistance Needed: Household gait with RW, her son helps her go up/down steps  ADL's / Homemaking Assistance Needed: does not use shower, assisted for  ADLs        Hand Dominance   Dominant Hand: Right    Extremity/Trunk Assessment   Upper Extremity Assessment Upper Extremity Assessment: Generalized weakness;LUE deficits/detail LUE Deficits / Details: not tested secondary in sling due to humeral fracture    Lower Extremity Assessment Lower Extremity Assessment: Generalized weakness;RLE deficits/detail;LLE deficits/detail RLE Deficits / Details: grossly 4+/5 LLE Deficits / Details: grossly 3+/5       Communication   Communication: HOH  Cognition Arousal/Alertness: Awake/alert Behavior During Therapy: WFL for tasks assessed/performed Overall Cognitive Status: Within Functional Limits for tasks assessed                                        General Comments      Exercises     Assessment/Plan    PT Assessment Patient needs continued PT services  PT Problem List Decreased strength;Decreased activity tolerance;Decreased balance;Decreased mobility       PT Treatment Interventions Gait training;Stair training;Functional mobility training;Therapeutic activities;Therapeutic exercise;Patient/family education    PT Goals (Current goals can be found in the Care Plan section)  Acute Rehab PT Goals Patient Stated Goal: return home after rehab  PT Goal Formulation: With patient Time For Goal Achievement: 02/07/17 Potential to Achieve Goals: Good    Frequency 7X/week   Barriers to discharge        Co-evaluation               AM-PAC PT "6 Clicks" Daily Activity  Outcome Measure Difficulty turning over in bed (including adjusting bedclothes, sheets and blankets)?: Unable Difficulty moving from lying on back to sitting on the side of the bed? : Unable Difficulty sitting down on and standing up from a chair with arms (e.g., wheelchair, bedside commode, etc,.)?: Unable Help needed moving to and from a bed to chair (including a wheelchair)?: A Little Help needed walking in hospital room?: A  Little Help needed climbing 3-5 steps with a railing? : A Lot 6 Click Score: 11    End of Session Equipment Utilized During Treatment: Gait belt;Other (comment) (sling LUE) Activity Tolerance: Patient tolerated treatment well;Patient limited by fatigue Patient left: in bed;with bed alarm set;with call bell/phone within reach Nurse Communication: Mobility status PT Visit Diagnosis: Unsteadiness on feet (R26.81);Other abnormalities of gait and mobility (R26.89);Muscle weakness (generalized) (M62.81)    Time: 4098-1191 PT Time Calculation (min) (ACUTE ONLY): 34 min   Charges:   PT Evaluation $PT Eval Low Complexity: 1 Low PT Treatments $Therapeutic Activity: 23-37 mins   PT G Codes:        11:56 AM, 03-Feb-2017 Ocie Bob, MPT Physical Therapist with [facility] [phone] office [phone] mobile phone

## 2017-01-31 NOTE — Care Management Note (Signed)
Case Management Note  Patient Details  Name: Debbie Webb MRN: [id number] Date of Birth: [birth year]  Subjective/Objective:                  Admitted with COPD exacerbation. Pt from [facility]. She has been recommended for SNF. Pt, family and facility wish for pt to return to facility. Pt active with [facility] pta.   Action/Plan: CSW has made arrangements for return to facility. CM has notified [facility] [employer] rep, of DC planned for today, she will obtain pt order from Epic. Facility will provide transport this afternoon. Pt/facility aware that [facility] has 48 hrs to make resumption visit.   Expected Discharge Date:  01/31/17               Expected Discharge Plan:  Assisted Living / Rest Home (with home health services)  In-House Referral:  Clinical Social Work  Discharge planning Services  CM Consult  Post Acute Care Choice:  Home Health, Resumption of Svcs/PTA Provider Choice offered to:  Patient, Adult Children  HH Arranged:  Charity fundraiser, PT HH Agency:  [facility]  Status of Service:  Completed, signed off Malcolm Metro, RN 01/31/2017, 2:06 PM

## 2017-01-31 NOTE — NC FL2 (Signed)
[place] MEDICAID FL2 LEVEL OF CARE SCREENING TOOL     IDENTIFICATION  Patient Name: Debbie Webb Birthdate: February 12, 1921 Sex: female Admission Date (Current Location): 01/29/2017  [facility] and [place] Number:  [employer] and Address:  [facility],  [address], [place] [address]      Provider Number: [id number]  Attending Physician Name and Address:  Eddie North, MD  Relative Name and Phone Number:       Current Level of Care: Hospital Recommended Level of Care: Assisted Living Facility Prior Approval Number:    Date Approved/Denied:   PASRR Number:    Discharge Plan: Other (Comment) [facility])    Current Diagnoses: Patient Active Problem List   Diagnosis Date Noted  . COPD with acute bronchitis (HCC) 01/29/2017  . S/P left hip hemiarthroplasty 01/01/2017  . Left displaced femoral neck fracture (HCC) 12/28/2016  . HLD (hyperlipidemia) 12/27/2016  . Fracture of femoral neck, left, closed (HCC) 12/27/2016  . Fx humeral neck, left, closed, initial encounter 12/27/2016  . Depression with anxiety 12/27/2016  . Essential hypertension, benign   . Aortic stenosis 09/12/2012  . GERD (gastroesophageal reflux disease) 01/26/2012  . Atypical chest pain 01/25/2012  . Carotid artery disease (HCC)   . Ankle sprain 08/23/2011  . Hypothermia 08/03/2011  . Fall 08/03/2011  . Hypokalemia 08/03/2011  . Leukocytosis 08/03/2011  . Azotemia 08/03/2011  . Left hip pain 08/03/2011  . Personal history of stroke with residual effects 08/03/2011  . Hemiparesis affecting left side as late effect of cerebrovascular accident (HCC) 08/03/2011  . Bradycardia 08/03/2011  . Hand ulceration (HCC) 08/03/2011  . Elevated LFTs 08/03/2011  . Elevated troponin I level 08/03/2011  . Rhabdomyolysis 08/03/2011  . Stroke (HCC) 05/15/1995    Orientation RESPIRATION BLADDER Height & Weight     Self, Time, Situation, Place  Normal Continent Weight: 117  lb 1 oz (53.1 kg) Height:  5\' 5"  (165.1 cm)  BEHAVIORAL SYMPTOMS/MOOD NEUROLOGICAL BOWEL NUTRITION STATUS      Continent Diet (Heart Healthy)  AMBULATORY STATUS COMMUNICATION OF NEEDS Skin   Limited Assist (Patient will need a Hemi Walker for ambulation) Verbally Normal                       Personal Care Assistance Level of Assistance  Bathing, Feeding, Dressing Bathing Assistance: Limited assistance Feeding assistance: Independent Dressing Assistance: Limited assistance     Functional Limitations Info  Sight, Hearing, Speech Sight Info: Adequate Hearing Info: Adequate Speech Info: Adequate    SPECIAL CARE FACTORS FREQUENCY  PT (By licensed PT)     PT Frequency: 3x/week              Contractures Contractures Info: Not present    Additional Factors Info  Psychotropic Code Status Info: Full Code    Psychotropic Info: Zoloft         Current Medications (01/31/2017):  This is the current hospital active medication list Current Facility-Administered Medications  Medication Dose Route Frequency Provider Last Rate Last Dose  . albuterol (PROVENTIL) tablet 2 mg  2 mg Oral BID Richarda Overlie, MD   2 mg at 01/31/17 1100  . aspirin chewable tablet 81 mg  81 mg Oral Daily Richarda Overlie, MD   81 mg at 01/31/17 1049  . clopidogrel (PLAVIX) tablet 75 mg  75 mg Oral Daily Richarda Overlie, MD   75 mg at 01/31/17 1049  . enoxaparin (LOVENOX) injection 30 mg  30  mg Subcutaneous Q24H Richarda Overlie, MD   30 mg at 01/30/17 1635  . feeding supplement (ENSURE ENLIVE) (ENSURE ENLIVE) liquid 237 mL  237 mL Oral BID BM Richarda Overlie, MD   237 mL at 01/30/17 1123  . levalbuterol (XOPENEX) nebulizer solution 1.25 mg  1.25 mg Nebulization Q6H PRN Richarda Overlie, MD   1.25 mg at 01/29/17 2003  . levalbuterol (XOPENEX) nebulizer solution 1.25 mg  1.25 mg Nebulization TID Richarda Overlie, MD   1.25 mg at 01/31/17 0827  . levofloxacin (LEVAQUIN) IVPB 500 mg  500 mg Intravenous Q48H Dhungel,  Nishant, MD 100 mL/hr at 01/31/17 1102 500 mg at 01/31/17 1102  . methocarbamol (ROBAXIN) tablet 500 mg  500 mg Oral Q8H PRN Richarda Overlie, MD      . methylPREDNISolone sodium succinate (SOLU-MEDROL) 125 mg/2 mL injection 60 mg  60 mg Intravenous Q12H Richarda Overlie, MD   60 mg at 01/31/17 0430  . ondansetron (ZOFRAN) tablet 4 mg  4 mg Oral Q6H PRN Richarda Overlie, MD       Or  . ondansetron (ZOFRAN) injection 4 mg  4 mg Intravenous Q6H PRN Richarda Overlie, MD      . oxybutynin (DITROPAN) tablet 5 mg  5 mg Oral Q8H PRN Richarda Overlie, MD      . oxyCODONE-acetaminophen (PERCOCET/ROXICET) 5-325 MG per tablet 1 tablet  1 tablet Oral Q4H PRN Richarda Overlie, MD   1 tablet at 01/30/17 2300  . polyethylene glycol (MIRALAX / GLYCOLAX) packet 17 g  17 g Oral Daily PRN Abrol, Germain Osgood, MD      . polyethylene glycol (MIRALAX / GLYCOLAX) packet 17 g  17 g Oral Daily Richarda Overlie, MD   17 g at 01/31/17 1101  . senna (SENOKOT) tablet 8.6 mg  1 tablet Oral BID Richarda Overlie, MD   8.6 mg at 01/31/17 1050  . sertraline (ZOLOFT) tablet 25 mg  25 mg Oral Daily Richarda Overlie, MD   25 mg at 01/31/17 1050  . simvastatin (ZOCOR) tablet 40 mg  40 mg Oral q1800 Richarda Overlie, MD   40 mg at 01/30/17 1757  . vitamin B-12 (CYANOCOBALAMIN) tablet 100 mcg  100 mcg Oral Daily Richarda Overlie, MD         Discharge Medications:  TAKE these medications           albuterol 2 MG tablet Commonly known as:  PROVENTIL Take 2 mg by mouth 2 (two) times daily.    aspirin 81 MG chewable tablet Chew 81 mg by mouth daily.    CENTRUM SILVER PO Take 1 tablet by mouth daily.    clopidogrel 75 MG tablet Commonly known as:  PLAVIX Take 75 mg by mouth daily.    docusate sodium 100 MG capsule Commonly known as:  COLACE Take 1 capsule (100 mg total) by mouth 2 (two) times daily.    ENSURE Take 237 mLs by mouth 2 (two) times daily between meals.    guaiFENesin-dextromethorphan 100-10 MG/5ML syrup Commonly known as:  ROBITUSSIN  DM Take 5 mLs by mouth every 4 (four) hours as needed for cough.    levofloxacin 500 MG tablet Commonly known as:  LEVAQUIN Take 1 tablet (500 mg total) by mouth daily.    methocarbamol 500 MG tablet Commonly known as:  ROBAXIN Take 1 tablet (500 mg total) by mouth every 8 (eight) hours as needed for muscle spasms.    oxybutynin 5 MG tablet Commonly known as:  DITROPAN Take 5 mg by mouth every  8 (eight) hours as needed for bladder spasms.    oxyCODONE-acetaminophen 5-325 MG tablet Commonly known as:  PERCOCET/ROXICET Take 1 tablet by mouth every 4 (four) hours as needed for moderate pain.    polyethylene glycol packet Commonly known as:  MIRALAX / GLYCOLAX Take 17 g by mouth daily as needed for mild constipation.    predniSONE 20 MG tablet Commonly known as:  DELTASONE Take 1 tablet (20 mg total) by mouth daily with breakfast.    senna 8.6 MG Tabs tablet Commonly known as:  SENOKOT Take 1 tablet (8.6 mg total) by mouth 2 (two) times daily.    sertraline 25 MG tablet Commonly known as:  ZOLOFT Take 25 mg by mouth daily.    simvastatin 40 MG tablet Commonly known as:  ZOCOR Take 40 mg by mouth daily.    vitamin B-12 100 MCG tablet Commonly known as:  CYANOCOBALAMIN Take 100 mcg by mouth daily.      Relevant Imaging Results:  Relevant Lab Results:   Additional Information Hemi Walker needed for ambulation  Jamaria Amborn, Juleen China, LCSW

## 2017-02-01 DIAGNOSIS — S72012D Unspecified intracapsular fracture of left femur, subsequent encounter for closed fracture with routine healing: Secondary | ICD-10-CM | POA: Diagnosis not present

## 2017-02-01 DIAGNOSIS — I10 Essential (primary) hypertension: Secondary | ICD-10-CM | POA: Diagnosis not present

## 2017-02-01 DIAGNOSIS — Z7902 Long term (current) use of antithrombotics/antiplatelets: Secondary | ICD-10-CM | POA: Diagnosis not present

## 2017-02-01 DIAGNOSIS — I2510 Atherosclerotic heart disease of native coronary artery without angina pectoris: Secondary | ICD-10-CM | POA: Diagnosis not present

## 2017-02-01 DIAGNOSIS — S42212D Unspecified displaced fracture of surgical neck of left humerus, subsequent encounter for fracture with routine healing: Secondary | ICD-10-CM | POA: Diagnosis not present

## 2017-02-01 DIAGNOSIS — Z9181 History of falling: Secondary | ICD-10-CM | POA: Diagnosis not present

## 2017-02-01 DIAGNOSIS — I69354 Hemiplegia and hemiparesis following cerebral infarction affecting left non-dominant side: Secondary | ICD-10-CM | POA: Diagnosis not present

## 2017-02-01 DIAGNOSIS — Z79891 Long term (current) use of opiate analgesic: Secondary | ICD-10-CM | POA: Diagnosis not present

## 2017-02-04 DIAGNOSIS — Z9181 History of falling: Secondary | ICD-10-CM | POA: Diagnosis not present

## 2017-02-04 DIAGNOSIS — I2510 Atherosclerotic heart disease of native coronary artery without angina pectoris: Secondary | ICD-10-CM | POA: Diagnosis not present

## 2017-02-04 DIAGNOSIS — S72012D Unspecified intracapsular fracture of left femur, subsequent encounter for closed fracture with routine healing: Secondary | ICD-10-CM | POA: Diagnosis not present

## 2017-02-04 DIAGNOSIS — I69354 Hemiplegia and hemiparesis following cerebral infarction affecting left non-dominant side: Secondary | ICD-10-CM | POA: Diagnosis not present

## 2017-02-04 DIAGNOSIS — S42212D Unspecified displaced fracture of surgical neck of left humerus, subsequent encounter for fracture with routine healing: Secondary | ICD-10-CM | POA: Diagnosis not present

## 2017-02-04 DIAGNOSIS — Z79891 Long term (current) use of opiate analgesic: Secondary | ICD-10-CM | POA: Diagnosis not present

## 2017-02-04 DIAGNOSIS — I10 Essential (primary) hypertension: Secondary | ICD-10-CM | POA: Diagnosis not present

## 2017-02-04 DIAGNOSIS — Z7902 Long term (current) use of antithrombotics/antiplatelets: Secondary | ICD-10-CM | POA: Diagnosis not present

## 2017-02-05 DIAGNOSIS — Z79891 Long term (current) use of opiate analgesic: Secondary | ICD-10-CM | POA: Diagnosis not present

## 2017-02-05 DIAGNOSIS — Z9181 History of falling: Secondary | ICD-10-CM | POA: Diagnosis not present

## 2017-02-05 DIAGNOSIS — I10 Essential (primary) hypertension: Secondary | ICD-10-CM | POA: Diagnosis not present

## 2017-02-05 DIAGNOSIS — I2510 Atherosclerotic heart disease of native coronary artery without angina pectoris: Secondary | ICD-10-CM | POA: Diagnosis not present

## 2017-02-05 DIAGNOSIS — I69354 Hemiplegia and hemiparesis following cerebral infarction affecting left non-dominant side: Secondary | ICD-10-CM | POA: Diagnosis not present

## 2017-02-05 DIAGNOSIS — S72012D Unspecified intracapsular fracture of left femur, subsequent encounter for closed fracture with routine healing: Secondary | ICD-10-CM | POA: Diagnosis not present

## 2017-02-05 DIAGNOSIS — S42212D Unspecified displaced fracture of surgical neck of left humerus, subsequent encounter for fracture with routine healing: Secondary | ICD-10-CM | POA: Diagnosis not present

## 2017-02-05 DIAGNOSIS — Z7902 Long term (current) use of antithrombotics/antiplatelets: Secondary | ICD-10-CM | POA: Diagnosis not present

## 2017-02-07 DIAGNOSIS — Z7902 Long term (current) use of antithrombotics/antiplatelets: Secondary | ICD-10-CM | POA: Diagnosis not present

## 2017-02-07 DIAGNOSIS — S72012D Unspecified intracapsular fracture of left femur, subsequent encounter for closed fracture with routine healing: Secondary | ICD-10-CM | POA: Diagnosis not present

## 2017-02-07 DIAGNOSIS — I69354 Hemiplegia and hemiparesis following cerebral infarction affecting left non-dominant side: Secondary | ICD-10-CM | POA: Diagnosis not present

## 2017-02-07 DIAGNOSIS — I2510 Atherosclerotic heart disease of native coronary artery without angina pectoris: Secondary | ICD-10-CM | POA: Diagnosis not present

## 2017-02-07 DIAGNOSIS — S42212D Unspecified displaced fracture of surgical neck of left humerus, subsequent encounter for fracture with routine healing: Secondary | ICD-10-CM | POA: Diagnosis not present

## 2017-02-07 DIAGNOSIS — I10 Essential (primary) hypertension: Secondary | ICD-10-CM | POA: Diagnosis not present

## 2017-02-07 DIAGNOSIS — Z79891 Long term (current) use of opiate analgesic: Secondary | ICD-10-CM | POA: Diagnosis not present

## 2017-02-07 DIAGNOSIS — Z9181 History of falling: Secondary | ICD-10-CM | POA: Diagnosis not present

## 2017-02-08 DIAGNOSIS — Z7902 Long term (current) use of antithrombotics/antiplatelets: Secondary | ICD-10-CM | POA: Diagnosis not present

## 2017-02-08 DIAGNOSIS — Z9181 History of falling: Secondary | ICD-10-CM | POA: Diagnosis not present

## 2017-02-08 DIAGNOSIS — I69354 Hemiplegia and hemiparesis following cerebral infarction affecting left non-dominant side: Secondary | ICD-10-CM | POA: Diagnosis not present

## 2017-02-08 DIAGNOSIS — S42212D Unspecified displaced fracture of surgical neck of left humerus, subsequent encounter for fracture with routine healing: Secondary | ICD-10-CM | POA: Diagnosis not present

## 2017-02-08 DIAGNOSIS — I10 Essential (primary) hypertension: Secondary | ICD-10-CM | POA: Diagnosis not present

## 2017-02-08 DIAGNOSIS — I2510 Atherosclerotic heart disease of native coronary artery without angina pectoris: Secondary | ICD-10-CM | POA: Diagnosis not present

## 2017-02-08 DIAGNOSIS — Z79891 Long term (current) use of opiate analgesic: Secondary | ICD-10-CM | POA: Diagnosis not present

## 2017-02-08 DIAGNOSIS — S72012D Unspecified intracapsular fracture of left femur, subsequent encounter for closed fracture with routine healing: Secondary | ICD-10-CM | POA: Diagnosis not present

## 2017-02-11 DIAGNOSIS — Z9181 History of falling: Secondary | ICD-10-CM | POA: Diagnosis not present

## 2017-02-11 DIAGNOSIS — S72012D Unspecified intracapsular fracture of left femur, subsequent encounter for closed fracture with routine healing: Secondary | ICD-10-CM | POA: Diagnosis not present

## 2017-02-11 DIAGNOSIS — S42212D Unspecified displaced fracture of surgical neck of left humerus, subsequent encounter for fracture with routine healing: Secondary | ICD-10-CM | POA: Diagnosis not present

## 2017-02-11 DIAGNOSIS — I2510 Atherosclerotic heart disease of native coronary artery without angina pectoris: Secondary | ICD-10-CM | POA: Diagnosis not present

## 2017-02-11 DIAGNOSIS — Z7902 Long term (current) use of antithrombotics/antiplatelets: Secondary | ICD-10-CM | POA: Diagnosis not present

## 2017-02-11 DIAGNOSIS — I10 Essential (primary) hypertension: Secondary | ICD-10-CM | POA: Diagnosis not present

## 2017-02-11 DIAGNOSIS — I69354 Hemiplegia and hemiparesis following cerebral infarction affecting left non-dominant side: Secondary | ICD-10-CM | POA: Diagnosis not present

## 2017-02-11 DIAGNOSIS — Z79891 Long term (current) use of opiate analgesic: Secondary | ICD-10-CM | POA: Diagnosis not present

## 2017-02-12 DIAGNOSIS — S72042D Displaced fracture of base of neck of left femur, subsequent encounter for closed fracture with routine healing: Secondary | ICD-10-CM | POA: Diagnosis not present

## 2017-02-12 DIAGNOSIS — S42292D Other displaced fracture of upper end of left humerus, subsequent encounter for fracture with routine healing: Secondary | ICD-10-CM | POA: Diagnosis not present

## 2017-02-13 DIAGNOSIS — I2510 Atherosclerotic heart disease of native coronary artery without angina pectoris: Secondary | ICD-10-CM | POA: Diagnosis not present

## 2017-02-13 DIAGNOSIS — S42212D Unspecified displaced fracture of surgical neck of left humerus, subsequent encounter for fracture with routine healing: Secondary | ICD-10-CM | POA: Diagnosis not present

## 2017-02-13 DIAGNOSIS — I10 Essential (primary) hypertension: Secondary | ICD-10-CM | POA: Diagnosis not present

## 2017-02-13 DIAGNOSIS — Z9181 History of falling: Secondary | ICD-10-CM | POA: Diagnosis not present

## 2017-02-13 DIAGNOSIS — I69354 Hemiplegia and hemiparesis following cerebral infarction affecting left non-dominant side: Secondary | ICD-10-CM | POA: Diagnosis not present

## 2017-02-13 DIAGNOSIS — Z7902 Long term (current) use of antithrombotics/antiplatelets: Secondary | ICD-10-CM | POA: Diagnosis not present

## 2017-02-13 DIAGNOSIS — Z79891 Long term (current) use of opiate analgesic: Secondary | ICD-10-CM | POA: Diagnosis not present

## 2017-02-13 DIAGNOSIS — S72012D Unspecified intracapsular fracture of left femur, subsequent encounter for closed fracture with routine healing: Secondary | ICD-10-CM | POA: Diagnosis not present

## 2017-02-15 DIAGNOSIS — I10 Essential (primary) hypertension: Secondary | ICD-10-CM | POA: Diagnosis not present

## 2017-02-15 DIAGNOSIS — I69354 Hemiplegia and hemiparesis following cerebral infarction affecting left non-dominant side: Secondary | ICD-10-CM | POA: Diagnosis not present

## 2017-02-15 DIAGNOSIS — S42212D Unspecified displaced fracture of surgical neck of left humerus, subsequent encounter for fracture with routine healing: Secondary | ICD-10-CM | POA: Diagnosis not present

## 2017-02-15 DIAGNOSIS — I2510 Atherosclerotic heart disease of native coronary artery without angina pectoris: Secondary | ICD-10-CM | POA: Diagnosis not present

## 2017-02-15 DIAGNOSIS — Z7902 Long term (current) use of antithrombotics/antiplatelets: Secondary | ICD-10-CM | POA: Diagnosis not present

## 2017-02-15 DIAGNOSIS — I6789 Other cerebrovascular disease: Secondary | ICD-10-CM | POA: Diagnosis not present

## 2017-02-15 DIAGNOSIS — Z79891 Long term (current) use of opiate analgesic: Secondary | ICD-10-CM | POA: Diagnosis not present

## 2017-02-15 DIAGNOSIS — S72012D Unspecified intracapsular fracture of left femur, subsequent encounter for closed fracture with routine healing: Secondary | ICD-10-CM | POA: Diagnosis not present

## 2017-02-15 DIAGNOSIS — Z9181 History of falling: Secondary | ICD-10-CM | POA: Diagnosis not present

## 2017-02-18 DIAGNOSIS — S42212D Unspecified displaced fracture of surgical neck of left humerus, subsequent encounter for fracture with routine healing: Secondary | ICD-10-CM | POA: Diagnosis not present

## 2017-02-18 DIAGNOSIS — Z9181 History of falling: Secondary | ICD-10-CM | POA: Diagnosis not present

## 2017-02-18 DIAGNOSIS — Z79891 Long term (current) use of opiate analgesic: Secondary | ICD-10-CM | POA: Diagnosis not present

## 2017-02-18 DIAGNOSIS — I2510 Atherosclerotic heart disease of native coronary artery without angina pectoris: Secondary | ICD-10-CM | POA: Diagnosis not present

## 2017-02-18 DIAGNOSIS — I69354 Hemiplegia and hemiparesis following cerebral infarction affecting left non-dominant side: Secondary | ICD-10-CM | POA: Diagnosis not present

## 2017-02-18 DIAGNOSIS — Z7902 Long term (current) use of antithrombotics/antiplatelets: Secondary | ICD-10-CM | POA: Diagnosis not present

## 2017-02-18 DIAGNOSIS — S72012D Unspecified intracapsular fracture of left femur, subsequent encounter for closed fracture with routine healing: Secondary | ICD-10-CM | POA: Diagnosis not present

## 2017-02-18 DIAGNOSIS — I10 Essential (primary) hypertension: Secondary | ICD-10-CM | POA: Diagnosis not present

## 2017-02-20 DIAGNOSIS — Z9181 History of falling: Secondary | ICD-10-CM | POA: Diagnosis not present

## 2017-02-20 DIAGNOSIS — I69354 Hemiplegia and hemiparesis following cerebral infarction affecting left non-dominant side: Secondary | ICD-10-CM | POA: Diagnosis not present

## 2017-02-20 DIAGNOSIS — I10 Essential (primary) hypertension: Secondary | ICD-10-CM | POA: Diagnosis not present

## 2017-02-20 DIAGNOSIS — I2510 Atherosclerotic heart disease of native coronary artery without angina pectoris: Secondary | ICD-10-CM | POA: Diagnosis not present

## 2017-02-20 DIAGNOSIS — S72012D Unspecified intracapsular fracture of left femur, subsequent encounter for closed fracture with routine healing: Secondary | ICD-10-CM | POA: Diagnosis not present

## 2017-02-20 DIAGNOSIS — S42212D Unspecified displaced fracture of surgical neck of left humerus, subsequent encounter for fracture with routine healing: Secondary | ICD-10-CM | POA: Diagnosis not present

## 2017-02-20 DIAGNOSIS — Z79891 Long term (current) use of opiate analgesic: Secondary | ICD-10-CM | POA: Diagnosis not present

## 2017-02-20 DIAGNOSIS — Z7902 Long term (current) use of antithrombotics/antiplatelets: Secondary | ICD-10-CM | POA: Diagnosis not present

## 2017-02-21 DIAGNOSIS — Z7902 Long term (current) use of antithrombotics/antiplatelets: Secondary | ICD-10-CM | POA: Diagnosis not present

## 2017-02-21 DIAGNOSIS — I10 Essential (primary) hypertension: Secondary | ICD-10-CM | POA: Diagnosis not present

## 2017-02-21 DIAGNOSIS — Z79891 Long term (current) use of opiate analgesic: Secondary | ICD-10-CM | POA: Diagnosis not present

## 2017-02-21 DIAGNOSIS — Z6820 Body mass index (BMI) 20.0-20.9, adult: Secondary | ICD-10-CM | POA: Diagnosis not present

## 2017-02-21 DIAGNOSIS — S72012D Unspecified intracapsular fracture of left femur, subsequent encounter for closed fracture with routine healing: Secondary | ICD-10-CM | POA: Diagnosis not present

## 2017-02-21 DIAGNOSIS — J449 Chronic obstructive pulmonary disease, unspecified: Secondary | ICD-10-CM | POA: Diagnosis not present

## 2017-02-21 DIAGNOSIS — I2510 Atherosclerotic heart disease of native coronary artery without angina pectoris: Secondary | ICD-10-CM | POA: Diagnosis not present

## 2017-02-21 DIAGNOSIS — R1312 Dysphagia, oropharyngeal phase: Secondary | ICD-10-CM | POA: Diagnosis not present

## 2017-02-21 DIAGNOSIS — Z9181 History of falling: Secondary | ICD-10-CM | POA: Diagnosis not present

## 2017-02-21 DIAGNOSIS — I69354 Hemiplegia and hemiparesis following cerebral infarction affecting left non-dominant side: Secondary | ICD-10-CM | POA: Diagnosis not present

## 2017-02-21 DIAGNOSIS — S42212D Unspecified displaced fracture of surgical neck of left humerus, subsequent encounter for fracture with routine healing: Secondary | ICD-10-CM | POA: Diagnosis not present

## 2017-02-26 DIAGNOSIS — S72012D Unspecified intracapsular fracture of left femur, subsequent encounter for closed fracture with routine healing: Secondary | ICD-10-CM | POA: Diagnosis not present

## 2017-02-26 DIAGNOSIS — Z7902 Long term (current) use of antithrombotics/antiplatelets: Secondary | ICD-10-CM | POA: Diagnosis not present

## 2017-02-26 DIAGNOSIS — I2510 Atherosclerotic heart disease of native coronary artery without angina pectoris: Secondary | ICD-10-CM | POA: Diagnosis not present

## 2017-02-26 DIAGNOSIS — I69354 Hemiplegia and hemiparesis following cerebral infarction affecting left non-dominant side: Secondary | ICD-10-CM | POA: Diagnosis not present

## 2017-02-26 DIAGNOSIS — S42212D Unspecified displaced fracture of surgical neck of left humerus, subsequent encounter for fracture with routine healing: Secondary | ICD-10-CM | POA: Diagnosis not present

## 2017-02-26 DIAGNOSIS — I10 Essential (primary) hypertension: Secondary | ICD-10-CM | POA: Diagnosis not present

## 2017-02-26 DIAGNOSIS — Z9181 History of falling: Secondary | ICD-10-CM | POA: Diagnosis not present

## 2017-02-26 DIAGNOSIS — Z79891 Long term (current) use of opiate analgesic: Secondary | ICD-10-CM | POA: Diagnosis not present

## 2017-02-27 DIAGNOSIS — S42212D Unspecified displaced fracture of surgical neck of left humerus, subsequent encounter for fracture with routine healing: Secondary | ICD-10-CM | POA: Diagnosis not present

## 2017-02-27 DIAGNOSIS — I2510 Atherosclerotic heart disease of native coronary artery without angina pectoris: Secondary | ICD-10-CM | POA: Diagnosis not present

## 2017-02-27 DIAGNOSIS — I10 Essential (primary) hypertension: Secondary | ICD-10-CM | POA: Diagnosis not present

## 2017-02-27 DIAGNOSIS — Z7902 Long term (current) use of antithrombotics/antiplatelets: Secondary | ICD-10-CM | POA: Diagnosis not present

## 2017-02-27 DIAGNOSIS — S72012D Unspecified intracapsular fracture of left femur, subsequent encounter for closed fracture with routine healing: Secondary | ICD-10-CM | POA: Diagnosis not present

## 2017-02-27 DIAGNOSIS — I69354 Hemiplegia and hemiparesis following cerebral infarction affecting left non-dominant side: Secondary | ICD-10-CM | POA: Diagnosis not present

## 2017-02-27 DIAGNOSIS — Z79891 Long term (current) use of opiate analgesic: Secondary | ICD-10-CM | POA: Diagnosis not present

## 2017-02-27 DIAGNOSIS — Z9181 History of falling: Secondary | ICD-10-CM | POA: Diagnosis not present

## 2017-02-28 ENCOUNTER — Other Ambulatory Visit (HOSPITAL_COMMUNITY): Payer: Self-pay | Admitting: Internal Medicine

## 2017-03-01 ENCOUNTER — Other Ambulatory Visit (HOSPITAL_COMMUNITY): Payer: Self-pay | Admitting: Internal Medicine

## 2017-03-01 DIAGNOSIS — Z79891 Long term (current) use of opiate analgesic: Secondary | ICD-10-CM | POA: Diagnosis not present

## 2017-03-01 DIAGNOSIS — Z9181 History of falling: Secondary | ICD-10-CM | POA: Diagnosis not present

## 2017-03-01 DIAGNOSIS — S72012D Unspecified intracapsular fracture of left femur, subsequent encounter for closed fracture with routine healing: Secondary | ICD-10-CM | POA: Diagnosis not present

## 2017-03-01 DIAGNOSIS — I69354 Hemiplegia and hemiparesis following cerebral infarction affecting left non-dominant side: Secondary | ICD-10-CM | POA: Diagnosis not present

## 2017-03-01 DIAGNOSIS — I10 Essential (primary) hypertension: Secondary | ICD-10-CM | POA: Diagnosis not present

## 2017-03-01 DIAGNOSIS — I2510 Atherosclerotic heart disease of native coronary artery without angina pectoris: Secondary | ICD-10-CM | POA: Diagnosis not present

## 2017-03-01 DIAGNOSIS — Z7902 Long term (current) use of antithrombotics/antiplatelets: Secondary | ICD-10-CM | POA: Diagnosis not present

## 2017-03-01 DIAGNOSIS — S42212D Unspecified displaced fracture of surgical neck of left humerus, subsequent encounter for fracture with routine healing: Secondary | ICD-10-CM | POA: Diagnosis not present

## 2017-03-01 DIAGNOSIS — R1310 Dysphagia, unspecified: Secondary | ICD-10-CM

## 2017-03-04 DIAGNOSIS — I69354 Hemiplegia and hemiparesis following cerebral infarction affecting left non-dominant side: Secondary | ICD-10-CM | POA: Diagnosis not present

## 2017-03-04 DIAGNOSIS — I2510 Atherosclerotic heart disease of native coronary artery without angina pectoris: Secondary | ICD-10-CM | POA: Diagnosis not present

## 2017-03-04 DIAGNOSIS — I10 Essential (primary) hypertension: Secondary | ICD-10-CM | POA: Diagnosis not present

## 2017-03-04 DIAGNOSIS — S72012D Unspecified intracapsular fracture of left femur, subsequent encounter for closed fracture with routine healing: Secondary | ICD-10-CM | POA: Diagnosis not present

## 2017-03-04 DIAGNOSIS — S42212D Unspecified displaced fracture of surgical neck of left humerus, subsequent encounter for fracture with routine healing: Secondary | ICD-10-CM | POA: Diagnosis not present

## 2017-03-04 DIAGNOSIS — Z79891 Long term (current) use of opiate analgesic: Secondary | ICD-10-CM | POA: Diagnosis not present

## 2017-03-04 DIAGNOSIS — Z7902 Long term (current) use of antithrombotics/antiplatelets: Secondary | ICD-10-CM | POA: Diagnosis not present

## 2017-03-04 DIAGNOSIS — Z9181 History of falling: Secondary | ICD-10-CM | POA: Diagnosis not present

## 2017-03-05 ENCOUNTER — Ambulatory Visit (HOSPITAL_COMMUNITY)
Admission: RE | Admit: 2017-03-05 | Discharge: 2017-03-05 | Disposition: A | Discharge status: Home / Self Care | Payer: Medicare Other | Source: Ambulatory Visit | Attending: Internal Medicine | Admitting: Internal Medicine

## 2017-03-05 DIAGNOSIS — Q396 Congenital diverticulum of esophagus: Secondary | ICD-10-CM | POA: Diagnosis not present

## 2017-03-05 DIAGNOSIS — K225 Diverticulum of esophagus, acquired: Secondary | ICD-10-CM | POA: Diagnosis not present

## 2017-03-05 DIAGNOSIS — R1310 Dysphagia, unspecified: Secondary | ICD-10-CM

## 2017-03-06 DIAGNOSIS — S72012D Unspecified intracapsular fracture of left femur, subsequent encounter for closed fracture with routine healing: Secondary | ICD-10-CM | POA: Diagnosis not present

## 2017-03-06 DIAGNOSIS — Z7902 Long term (current) use of antithrombotics/antiplatelets: Secondary | ICD-10-CM | POA: Diagnosis not present

## 2017-03-06 DIAGNOSIS — Z79891 Long term (current) use of opiate analgesic: Secondary | ICD-10-CM | POA: Diagnosis not present

## 2017-03-06 DIAGNOSIS — I2510 Atherosclerotic heart disease of native coronary artery without angina pectoris: Secondary | ICD-10-CM | POA: Diagnosis not present

## 2017-03-06 DIAGNOSIS — I69354 Hemiplegia and hemiparesis following cerebral infarction affecting left non-dominant side: Secondary | ICD-10-CM | POA: Diagnosis not present

## 2017-03-06 DIAGNOSIS — Z9181 History of falling: Secondary | ICD-10-CM | POA: Diagnosis not present

## 2017-03-06 DIAGNOSIS — S42212D Unspecified displaced fracture of surgical neck of left humerus, subsequent encounter for fracture with routine healing: Secondary | ICD-10-CM | POA: Diagnosis not present

## 2017-03-06 DIAGNOSIS — I10 Essential (primary) hypertension: Secondary | ICD-10-CM | POA: Diagnosis not present

## 2017-03-07 ENCOUNTER — Encounter (INDEPENDENT_AMBULATORY_CARE_PROVIDER_SITE_OTHER): Payer: Self-pay | Admitting: *Deleted

## 2017-03-07 ENCOUNTER — Other Ambulatory Visit (INDEPENDENT_AMBULATORY_CARE_PROVIDER_SITE_OTHER): Payer: Self-pay | Admitting: *Deleted

## 2017-03-07 DIAGNOSIS — I10 Essential (primary) hypertension: Secondary | ICD-10-CM | POA: Diagnosis not present

## 2017-03-07 DIAGNOSIS — I69354 Hemiplegia and hemiparesis following cerebral infarction affecting left non-dominant side: Secondary | ICD-10-CM | POA: Diagnosis not present

## 2017-03-07 DIAGNOSIS — S72012D Unspecified intracapsular fracture of left femur, subsequent encounter for closed fracture with routine healing: Secondary | ICD-10-CM | POA: Diagnosis not present

## 2017-03-07 DIAGNOSIS — Z9181 History of falling: Secondary | ICD-10-CM | POA: Diagnosis not present

## 2017-03-07 DIAGNOSIS — I2510 Atherosclerotic heart disease of native coronary artery without angina pectoris: Secondary | ICD-10-CM | POA: Diagnosis not present

## 2017-03-07 DIAGNOSIS — Z7902 Long term (current) use of antithrombotics/antiplatelets: Secondary | ICD-10-CM | POA: Diagnosis not present

## 2017-03-07 DIAGNOSIS — R195 Other fecal abnormalities: Secondary | ICD-10-CM

## 2017-03-07 DIAGNOSIS — Z79891 Long term (current) use of opiate analgesic: Secondary | ICD-10-CM | POA: Diagnosis not present

## 2017-03-07 DIAGNOSIS — S42212D Unspecified displaced fracture of surgical neck of left humerus, subsequent encounter for fracture with routine healing: Secondary | ICD-10-CM | POA: Diagnosis not present

## 2017-03-11 DIAGNOSIS — S42212D Unspecified displaced fracture of surgical neck of left humerus, subsequent encounter for fracture with routine healing: Secondary | ICD-10-CM | POA: Diagnosis not present

## 2017-03-11 DIAGNOSIS — Z9181 History of falling: Secondary | ICD-10-CM | POA: Diagnosis not present

## 2017-03-11 DIAGNOSIS — Z79891 Long term (current) use of opiate analgesic: Secondary | ICD-10-CM | POA: Diagnosis not present

## 2017-03-11 DIAGNOSIS — I10 Essential (primary) hypertension: Secondary | ICD-10-CM | POA: Diagnosis not present

## 2017-03-11 DIAGNOSIS — I2510 Atherosclerotic heart disease of native coronary artery without angina pectoris: Secondary | ICD-10-CM | POA: Diagnosis not present

## 2017-03-11 DIAGNOSIS — S72012D Unspecified intracapsular fracture of left femur, subsequent encounter for closed fracture with routine healing: Secondary | ICD-10-CM | POA: Diagnosis not present

## 2017-03-11 DIAGNOSIS — Z7902 Long term (current) use of antithrombotics/antiplatelets: Secondary | ICD-10-CM | POA: Diagnosis not present

## 2017-03-11 DIAGNOSIS — I69354 Hemiplegia and hemiparesis following cerebral infarction affecting left non-dominant side: Secondary | ICD-10-CM | POA: Diagnosis not present

## 2017-03-12 DIAGNOSIS — I2510 Atherosclerotic heart disease of native coronary artery without angina pectoris: Secondary | ICD-10-CM | POA: Diagnosis not present

## 2017-03-12 DIAGNOSIS — S42212D Unspecified displaced fracture of surgical neck of left humerus, subsequent encounter for fracture with routine healing: Secondary | ICD-10-CM | POA: Diagnosis not present

## 2017-03-12 DIAGNOSIS — I69354 Hemiplegia and hemiparesis following cerebral infarction affecting left non-dominant side: Secondary | ICD-10-CM | POA: Diagnosis not present

## 2017-03-12 DIAGNOSIS — Z79891 Long term (current) use of opiate analgesic: Secondary | ICD-10-CM | POA: Diagnosis not present

## 2017-03-12 DIAGNOSIS — Z9181 History of falling: Secondary | ICD-10-CM | POA: Diagnosis not present

## 2017-03-12 DIAGNOSIS — Z7902 Long term (current) use of antithrombotics/antiplatelets: Secondary | ICD-10-CM | POA: Diagnosis not present

## 2017-03-12 DIAGNOSIS — I10 Essential (primary) hypertension: Secondary | ICD-10-CM | POA: Diagnosis not present

## 2017-03-12 DIAGNOSIS — S72012D Unspecified intracapsular fracture of left femur, subsequent encounter for closed fracture with routine healing: Secondary | ICD-10-CM | POA: Diagnosis not present

## 2017-03-13 DIAGNOSIS — Z7902 Long term (current) use of antithrombotics/antiplatelets: Secondary | ICD-10-CM | POA: Diagnosis not present

## 2017-03-13 DIAGNOSIS — Z79891 Long term (current) use of opiate analgesic: Secondary | ICD-10-CM | POA: Diagnosis not present

## 2017-03-13 DIAGNOSIS — S72012D Unspecified intracapsular fracture of left femur, subsequent encounter for closed fracture with routine healing: Secondary | ICD-10-CM | POA: Diagnosis not present

## 2017-03-13 DIAGNOSIS — I69354 Hemiplegia and hemiparesis following cerebral infarction affecting left non-dominant side: Secondary | ICD-10-CM | POA: Diagnosis not present

## 2017-03-13 DIAGNOSIS — I10 Essential (primary) hypertension: Secondary | ICD-10-CM | POA: Diagnosis not present

## 2017-03-13 DIAGNOSIS — I2510 Atherosclerotic heart disease of native coronary artery without angina pectoris: Secondary | ICD-10-CM | POA: Diagnosis not present

## 2017-03-13 DIAGNOSIS — Z9181 History of falling: Secondary | ICD-10-CM | POA: Diagnosis not present

## 2017-03-13 DIAGNOSIS — S42212D Unspecified displaced fracture of surgical neck of left humerus, subsequent encounter for fracture with routine healing: Secondary | ICD-10-CM | POA: Diagnosis not present

## 2017-03-14 DIAGNOSIS — Z79891 Long term (current) use of opiate analgesic: Secondary | ICD-10-CM | POA: Diagnosis not present

## 2017-03-14 DIAGNOSIS — I2510 Atherosclerotic heart disease of native coronary artery without angina pectoris: Secondary | ICD-10-CM | POA: Diagnosis not present

## 2017-03-14 DIAGNOSIS — S72012D Unspecified intracapsular fracture of left femur, subsequent encounter for closed fracture with routine healing: Secondary | ICD-10-CM | POA: Diagnosis not present

## 2017-03-14 DIAGNOSIS — S42212D Unspecified displaced fracture of surgical neck of left humerus, subsequent encounter for fracture with routine healing: Secondary | ICD-10-CM | POA: Diagnosis not present

## 2017-03-14 DIAGNOSIS — I10 Essential (primary) hypertension: Secondary | ICD-10-CM | POA: Diagnosis not present

## 2017-03-14 DIAGNOSIS — Z9181 History of falling: Secondary | ICD-10-CM | POA: Diagnosis not present

## 2017-03-14 DIAGNOSIS — I69354 Hemiplegia and hemiparesis following cerebral infarction affecting left non-dominant side: Secondary | ICD-10-CM | POA: Diagnosis not present

## 2017-03-14 DIAGNOSIS — Z7902 Long term (current) use of antithrombotics/antiplatelets: Secondary | ICD-10-CM | POA: Diagnosis not present

## 2017-03-18 DIAGNOSIS — R300 Dysuria: Secondary | ICD-10-CM | POA: Diagnosis not present

## 2017-03-18 DIAGNOSIS — I6789 Other cerebrovascular disease: Secondary | ICD-10-CM | POA: Diagnosis not present

## 2017-03-20 ENCOUNTER — Encounter (INDEPENDENT_AMBULATORY_CARE_PROVIDER_SITE_OTHER): Payer: Self-pay | Admitting: Internal Medicine

## 2017-03-21 DIAGNOSIS — R195 Other fecal abnormalities: Secondary | ICD-10-CM | POA: Diagnosis not present

## 2017-03-22 LAB — CBC
Hematocrit: 38.3 % (ref 34.0–46.6)
Hemoglobin: 12.7 g/dL (ref 11.1–15.9)
MCH: 30.1 pg (ref 26.6–33.0)
MCHC: 33.2 g/dL (ref 31.5–35.7)
MCV: 91 fL (ref 79–97)
PLATELETS: 212 10*3/uL (ref 150–379)
RBC: 4.22 x10E6/uL (ref 3.77–5.28)
RDW: 14.9 % (ref 12.3–15.4)
WBC: 5.8 10*3/uL (ref 3.4–10.8)

## 2017-03-25 ENCOUNTER — Other Ambulatory Visit (INDEPENDENT_AMBULATORY_CARE_PROVIDER_SITE_OTHER): Payer: Self-pay | Admitting: *Deleted

## 2017-03-25 DIAGNOSIS — R195 Other fecal abnormalities: Secondary | ICD-10-CM

## 2017-03-26 ENCOUNTER — Encounter (INDEPENDENT_AMBULATORY_CARE_PROVIDER_SITE_OTHER): Payer: Self-pay | Admitting: Internal Medicine

## 2017-03-26 ENCOUNTER — Ambulatory Visit (INDEPENDENT_AMBULATORY_CARE_PROVIDER_SITE_OTHER): Payer: Medicare Other | Admitting: Internal Medicine

## 2017-03-26 VITALS — BP 130/58 | HR 70 | Temp 97.5°F | Resp 18 | Ht 60.0 in | Wt 123.0 lb

## 2017-03-26 DIAGNOSIS — K225 Diverticulum of esophagus, acquired: Secondary | ICD-10-CM

## 2017-03-26 DIAGNOSIS — R1310 Dysphagia, unspecified: Secondary | ICD-10-CM | POA: Diagnosis not present

## 2017-03-26 DIAGNOSIS — R1319 Other dysphagia: Secondary | ICD-10-CM

## 2017-03-26 DIAGNOSIS — Q396 Congenital diverticulum of esophagus: Secondary | ICD-10-CM

## 2017-03-26 NOTE — Patient Instructions (Addendum)
Continue diltiazem at current dose. Change diet to mechanical soft.  If not tolerated go back to pured diet.  Patient's son Debbie Webb will monitor symptoms until next office visit.

## 2017-03-26 NOTE — Progress Notes (Signed)
Reason for consultation;  Dysphagia and esophageal diverticuli.  History of present illness.:  Patient is a 83 year old Caucasian female who is referred through the courtesy of Dr. Sherwood GamblerFusco for evaluation of solid food dysphagia. Patient is accompanied by her son Aurther Lofterry. She was seen for dysphagia in October 2017.  She declined further testing.  Since then her swallowing difficulty has been occurring more and more frequently.  Dr. Sherwood GamblerFusco obtained a barium study which revealed 2 large esophageal diverticula and mid esophagus.  He did contact me to arrange for this visit.  I recommended trying her on low-dose diltiazem.  She also has been on pured diet. Patient states she is able to swallow much better.  In fact she has not had much difficulty since she was begun on diltiazem and her diet was changed to pured diet.  She has no difficulty with liquids.  She does not like to drink water but she likes Dr. Reino KentPepper.  She is not very happy that she is on pured diet.  She wants to go back to regular food.  She has had intermittent episodes of food impaction relieved with regurgitation.  She has not had any in the last 1 week. Her appetite is fair.  Patient believes she has lost 20 pounds in last year.  She feels weight loss is due to dietary changes and also while she was hospitalized for hip fracture. She is now staying at [facility].  She fell and broke her left hip in September 2018 when she had surgery and was then [facility].    Current Medications: Outpatient Encounter Medications as of 03/26/2017  Medication Sig  . albuterol (PROVENTIL) 2 MG tablet Take 2 mg by mouth 2 (two) times daily.  Marland Kitchen. aspirin 81 MG chewable tablet Chew 81 mg by mouth daily.  . ciprofloxacin (CIPRO) 500 MG tablet Take 500 mg 2 (two) times daily by mouth.  . clopidogrel (PLAVIX) 75 MG tablet Take 75 mg by mouth daily.  . Diclofenac Sodium-Menthol (DITHOL EX) Apply topically.  . docusate sodium  (COLACE) 100 MG capsule Take 1 capsule (100 mg total) by mouth 2 (two) times daily.  Marland Kitchen. ENSURE (ENSURE) Take 237 mLs by mouth 2 (two) times daily between meals.  Marland Kitchen. guaiFENesin-dextromethorphan (ROBITUSSIN DM) 100-10 MG/5ML syrup Take 5 mLs by mouth every 4 (four) hours as needed for cough.  . meprobamate (EQUANIL) 200 MG tablet Take 200 mg 3 (three) times daily by mouth.  . methocarbamol (ROBAXIN) 500 MG tablet Take 1 tablet (500 mg total) by mouth every 8 (eight) hours as needed for muscle spasms.  . Multiple Vitamins-Minerals (CENTRUM SILVER PO) Take 1 tablet by mouth daily.   Marland Kitchen. oxybutynin (DITROPAN) 5 MG tablet Take 5 mg by mouth every 8 (eight) hours as needed for bladder spasms.  Marland Kitchen. oxyCODONE-acetaminophen (PERCOCET/ROXICET) 5-325 MG tablet Take 1 tablet by mouth every 4 (four) hours as needed for moderate pain.  . polyethylene glycol (MIRALAX / GLYCOLAX) packet Take 17 g by mouth daily as needed for mild constipation.  . senna (SENOKOT) 8.6 MG TABS tablet Take 1 tablet (8.6 mg total) by mouth 2 (two) times daily.  . vitamin B-12 (CYANOCOBALAMIN) 100 MCG tablet Take 100 mcg by mouth daily.  . [DISCONTINUED] predniSONE (DELTASONE) 20 MG tablet Take 1 tablet (20 mg total) by mouth daily with breakfast. (Patient not taking: Reported on 03/26/2017)  . [DISCONTINUED] sertraline (ZOLOFT) 25 MG tablet Take 25 mg by mouth daily.  . [DISCONTINUED] simvastatin (ZOCOR) 40  MG tablet Take 40 mg by mouth daily.   No facility-administered encounter medications on file as of 03/26/2017.    Past Medical History:  Diagnosis Date  . Carotid artery disease (HCC)    Status post carotid endarterectomy x2  . Essential hypertension, benign   . Hemiparesis affecting left side as late effect of cerebrovascular accident (HCC) 08/03/2011  . Stroke (HCC) 1997  . Tremor    Past Surgical History:  Procedure Laterality Date  . CAROTID ENDARTERECTOMY     Right     Objective: Blood pressure (!) 130/58, pulse 70,  temperature (!) 97.5 F (36.4 C), temperature source Oral, resp. rate 18, height 5' (1.524 m), weight 123 lb (55.8 kg). The patient appears to be very comfortable in wheelchair. She is in no acute distress. Conjunctiva is pink. Sclera is nonicteric Oropharyngeal mucosa is normal. No neck masses or thyromegaly noted. Cardiac exam with regular rhythm normal S1 and S2.  She has faint systolic murmur heard in aortic area and left sternal border. Lungs are clear to auscultation. Abdomen is soft and nontender without organomegaly or masses. Trace edema noted around ankles.  Labs/studies Results:  Esophagogram from 03/05/2017 reveals 2 large diverticula in mid/thoracic esophagus.  Of these 2 diverticula proximal one is much larger.  No definite stricture noted.  Barium pill became lodged within the proximal diverticulum.  Study also revealed abnormal peristalsis consistent with esophageal dysmotility.  Assessment:  #1.  Esophageal dysphagia secondary to esophageal motility disorder.  Patient has developed 2 large esophageal diverticula.  She is not a candidate for surgical intervention.  It is nice to know that she is doing much better with low-dose diltiazem which she is tolerating well.  Changing her to pured diet may have also helped.  Since she is not happy with this diet will allow her to go back on mechanical soft diet and see how she does.  I have asked her son Aurther Lofterry to visit her at mealtime and see how she does.   If dysphagia becomes a daily symptom would consider esophagogastroduodenoscopy with esophageal dilation of the distal segment or Botox injection.   Plan:  Continue diltiazem at present dose which is 30 mg before each meal. Change diet to mechanical soft diet. Patient's son Aurther Lofterry will keep symptom diary until office visit in 2 months.

## 2017-04-08 ENCOUNTER — Encounter (INDEPENDENT_AMBULATORY_CARE_PROVIDER_SITE_OTHER): Payer: Self-pay | Admitting: Internal Medicine

## 2017-04-16 DIAGNOSIS — S42292D Other displaced fracture of upper end of left humerus, subsequent encounter for fracture with routine healing: Secondary | ICD-10-CM | POA: Diagnosis not present

## 2017-04-17 DIAGNOSIS — I6789 Other cerebrovascular disease: Secondary | ICD-10-CM | POA: Diagnosis not present

## 2017-04-18 DIAGNOSIS — Z79891 Long term (current) use of opiate analgesic: Secondary | ICD-10-CM | POA: Diagnosis not present

## 2017-04-18 DIAGNOSIS — Z7982 Long term (current) use of aspirin: Secondary | ICD-10-CM | POA: Diagnosis not present

## 2017-04-18 DIAGNOSIS — Z7902 Long term (current) use of antithrombotics/antiplatelets: Secondary | ICD-10-CM | POA: Diagnosis not present

## 2017-04-18 DIAGNOSIS — I10 Essential (primary) hypertension: Secondary | ICD-10-CM | POA: Diagnosis not present

## 2017-04-18 DIAGNOSIS — I69354 Hemiplegia and hemiparesis following cerebral infarction affecting left non-dominant side: Secondary | ICD-10-CM | POA: Diagnosis not present

## 2017-04-18 DIAGNOSIS — Z9181 History of falling: Secondary | ICD-10-CM | POA: Diagnosis not present

## 2017-04-18 DIAGNOSIS — Z96642 Presence of left artificial hip joint: Secondary | ICD-10-CM | POA: Diagnosis not present

## 2017-04-18 DIAGNOSIS — Z7952 Long term (current) use of systemic steroids: Secondary | ICD-10-CM | POA: Diagnosis not present

## 2017-04-18 DIAGNOSIS — S42202D Unspecified fracture of upper end of left humerus, subsequent encounter for fracture with routine healing: Secondary | ICD-10-CM | POA: Diagnosis not present

## 2017-04-19 DIAGNOSIS — Z7982 Long term (current) use of aspirin: Secondary | ICD-10-CM | POA: Diagnosis not present

## 2017-04-19 DIAGNOSIS — Z9181 History of falling: Secondary | ICD-10-CM | POA: Diagnosis not present

## 2017-04-19 DIAGNOSIS — S42202D Unspecified fracture of upper end of left humerus, subsequent encounter for fracture with routine healing: Secondary | ICD-10-CM | POA: Diagnosis not present

## 2017-04-19 DIAGNOSIS — I10 Essential (primary) hypertension: Secondary | ICD-10-CM | POA: Diagnosis not present

## 2017-04-19 DIAGNOSIS — Z96642 Presence of left artificial hip joint: Secondary | ICD-10-CM | POA: Diagnosis not present

## 2017-04-19 DIAGNOSIS — Z79891 Long term (current) use of opiate analgesic: Secondary | ICD-10-CM | POA: Diagnosis not present

## 2017-04-19 DIAGNOSIS — Z7902 Long term (current) use of antithrombotics/antiplatelets: Secondary | ICD-10-CM | POA: Diagnosis not present

## 2017-04-19 DIAGNOSIS — Z7952 Long term (current) use of systemic steroids: Secondary | ICD-10-CM | POA: Diagnosis not present

## 2017-04-19 DIAGNOSIS — I69354 Hemiplegia and hemiparesis following cerebral infarction affecting left non-dominant side: Secondary | ICD-10-CM | POA: Diagnosis not present

## 2017-04-23 DIAGNOSIS — I69354 Hemiplegia and hemiparesis following cerebral infarction affecting left non-dominant side: Secondary | ICD-10-CM | POA: Diagnosis not present

## 2017-04-23 DIAGNOSIS — Z7982 Long term (current) use of aspirin: Secondary | ICD-10-CM | POA: Diagnosis not present

## 2017-04-23 DIAGNOSIS — Z96642 Presence of left artificial hip joint: Secondary | ICD-10-CM | POA: Diagnosis not present

## 2017-04-23 DIAGNOSIS — Z7952 Long term (current) use of systemic steroids: Secondary | ICD-10-CM | POA: Diagnosis not present

## 2017-04-23 DIAGNOSIS — Z9181 History of falling: Secondary | ICD-10-CM | POA: Diagnosis not present

## 2017-04-23 DIAGNOSIS — Z79891 Long term (current) use of opiate analgesic: Secondary | ICD-10-CM | POA: Diagnosis not present

## 2017-04-23 DIAGNOSIS — S42202D Unspecified fracture of upper end of left humerus, subsequent encounter for fracture with routine healing: Secondary | ICD-10-CM | POA: Diagnosis not present

## 2017-04-23 DIAGNOSIS — Z7902 Long term (current) use of antithrombotics/antiplatelets: Secondary | ICD-10-CM | POA: Diagnosis not present

## 2017-04-23 DIAGNOSIS — I10 Essential (primary) hypertension: Secondary | ICD-10-CM | POA: Diagnosis not present

## 2017-04-24 DIAGNOSIS — Z79891 Long term (current) use of opiate analgesic: Secondary | ICD-10-CM | POA: Diagnosis not present

## 2017-04-24 DIAGNOSIS — I69354 Hemiplegia and hemiparesis following cerebral infarction affecting left non-dominant side: Secondary | ICD-10-CM | POA: Diagnosis not present

## 2017-04-24 DIAGNOSIS — Z7982 Long term (current) use of aspirin: Secondary | ICD-10-CM | POA: Diagnosis not present

## 2017-04-24 DIAGNOSIS — S42202D Unspecified fracture of upper end of left humerus, subsequent encounter for fracture with routine healing: Secondary | ICD-10-CM | POA: Diagnosis not present

## 2017-04-24 DIAGNOSIS — Z96642 Presence of left artificial hip joint: Secondary | ICD-10-CM | POA: Diagnosis not present

## 2017-04-24 DIAGNOSIS — I10 Essential (primary) hypertension: Secondary | ICD-10-CM | POA: Diagnosis not present

## 2017-04-24 DIAGNOSIS — Z7902 Long term (current) use of antithrombotics/antiplatelets: Secondary | ICD-10-CM | POA: Diagnosis not present

## 2017-04-24 DIAGNOSIS — Z7952 Long term (current) use of systemic steroids: Secondary | ICD-10-CM | POA: Diagnosis not present

## 2017-04-24 DIAGNOSIS — Z9181 History of falling: Secondary | ICD-10-CM | POA: Diagnosis not present

## 2017-04-25 DIAGNOSIS — Z7982 Long term (current) use of aspirin: Secondary | ICD-10-CM | POA: Diagnosis not present

## 2017-04-25 DIAGNOSIS — I10 Essential (primary) hypertension: Secondary | ICD-10-CM | POA: Diagnosis not present

## 2017-04-25 DIAGNOSIS — Z9181 History of falling: Secondary | ICD-10-CM | POA: Diagnosis not present

## 2017-04-25 DIAGNOSIS — S42202D Unspecified fracture of upper end of left humerus, subsequent encounter for fracture with routine healing: Secondary | ICD-10-CM | POA: Diagnosis not present

## 2017-04-25 DIAGNOSIS — Z7902 Long term (current) use of antithrombotics/antiplatelets: Secondary | ICD-10-CM | POA: Diagnosis not present

## 2017-04-25 DIAGNOSIS — Z96642 Presence of left artificial hip joint: Secondary | ICD-10-CM | POA: Diagnosis not present

## 2017-04-25 DIAGNOSIS — Z7952 Long term (current) use of systemic steroids: Secondary | ICD-10-CM | POA: Diagnosis not present

## 2017-04-25 DIAGNOSIS — Z79891 Long term (current) use of opiate analgesic: Secondary | ICD-10-CM | POA: Diagnosis not present

## 2017-04-25 DIAGNOSIS — I69354 Hemiplegia and hemiparesis following cerebral infarction affecting left non-dominant side: Secondary | ICD-10-CM | POA: Diagnosis not present

## 2017-04-29 DIAGNOSIS — Z7982 Long term (current) use of aspirin: Secondary | ICD-10-CM | POA: Diagnosis not present

## 2017-04-29 DIAGNOSIS — Z79891 Long term (current) use of opiate analgesic: Secondary | ICD-10-CM | POA: Diagnosis not present

## 2017-04-29 DIAGNOSIS — Z9181 History of falling: Secondary | ICD-10-CM | POA: Diagnosis not present

## 2017-04-29 DIAGNOSIS — S42202D Unspecified fracture of upper end of left humerus, subsequent encounter for fracture with routine healing: Secondary | ICD-10-CM | POA: Diagnosis not present

## 2017-04-29 DIAGNOSIS — I10 Essential (primary) hypertension: Secondary | ICD-10-CM | POA: Diagnosis not present

## 2017-04-29 DIAGNOSIS — Z96642 Presence of left artificial hip joint: Secondary | ICD-10-CM | POA: Diagnosis not present

## 2017-04-29 DIAGNOSIS — Z7952 Long term (current) use of systemic steroids: Secondary | ICD-10-CM | POA: Diagnosis not present

## 2017-04-29 DIAGNOSIS — Z7902 Long term (current) use of antithrombotics/antiplatelets: Secondary | ICD-10-CM | POA: Diagnosis not present

## 2017-04-29 DIAGNOSIS — I69354 Hemiplegia and hemiparesis following cerebral infarction affecting left non-dominant side: Secondary | ICD-10-CM | POA: Diagnosis not present

## 2017-05-01 DIAGNOSIS — Z7902 Long term (current) use of antithrombotics/antiplatelets: Secondary | ICD-10-CM | POA: Diagnosis not present

## 2017-05-01 DIAGNOSIS — Z96642 Presence of left artificial hip joint: Secondary | ICD-10-CM | POA: Diagnosis not present

## 2017-05-01 DIAGNOSIS — I69354 Hemiplegia and hemiparesis following cerebral infarction affecting left non-dominant side: Secondary | ICD-10-CM | POA: Diagnosis not present

## 2017-05-01 DIAGNOSIS — S42202D Unspecified fracture of upper end of left humerus, subsequent encounter for fracture with routine healing: Secondary | ICD-10-CM | POA: Diagnosis not present

## 2017-05-01 DIAGNOSIS — Z7982 Long term (current) use of aspirin: Secondary | ICD-10-CM | POA: Diagnosis not present

## 2017-05-01 DIAGNOSIS — Z7952 Long term (current) use of systemic steroids: Secondary | ICD-10-CM | POA: Diagnosis not present

## 2017-05-01 DIAGNOSIS — Z79891 Long term (current) use of opiate analgesic: Secondary | ICD-10-CM | POA: Diagnosis not present

## 2017-05-01 DIAGNOSIS — Z9181 History of falling: Secondary | ICD-10-CM | POA: Diagnosis not present

## 2017-05-01 DIAGNOSIS — I10 Essential (primary) hypertension: Secondary | ICD-10-CM | POA: Diagnosis not present

## 2017-05-05 DIAGNOSIS — Z79891 Long term (current) use of opiate analgesic: Secondary | ICD-10-CM | POA: Diagnosis not present

## 2017-05-05 DIAGNOSIS — S42202D Unspecified fracture of upper end of left humerus, subsequent encounter for fracture with routine healing: Secondary | ICD-10-CM | POA: Diagnosis not present

## 2017-05-05 DIAGNOSIS — I10 Essential (primary) hypertension: Secondary | ICD-10-CM | POA: Diagnosis not present

## 2017-05-05 DIAGNOSIS — Z7982 Long term (current) use of aspirin: Secondary | ICD-10-CM | POA: Diagnosis not present

## 2017-05-05 DIAGNOSIS — Z9181 History of falling: Secondary | ICD-10-CM | POA: Diagnosis not present

## 2017-05-05 DIAGNOSIS — Z7952 Long term (current) use of systemic steroids: Secondary | ICD-10-CM | POA: Diagnosis not present

## 2017-05-05 DIAGNOSIS — I69354 Hemiplegia and hemiparesis following cerebral infarction affecting left non-dominant side: Secondary | ICD-10-CM | POA: Diagnosis not present

## 2017-05-05 DIAGNOSIS — Z96642 Presence of left artificial hip joint: Secondary | ICD-10-CM | POA: Diagnosis not present

## 2017-05-05 DIAGNOSIS — Z7902 Long term (current) use of antithrombotics/antiplatelets: Secondary | ICD-10-CM | POA: Diagnosis not present

## 2017-05-09 DIAGNOSIS — Z96642 Presence of left artificial hip joint: Secondary | ICD-10-CM | POA: Diagnosis not present

## 2017-05-09 DIAGNOSIS — Z7952 Long term (current) use of systemic steroids: Secondary | ICD-10-CM | POA: Diagnosis not present

## 2017-05-09 DIAGNOSIS — Z7982 Long term (current) use of aspirin: Secondary | ICD-10-CM | POA: Diagnosis not present

## 2017-05-09 DIAGNOSIS — S42202D Unspecified fracture of upper end of left humerus, subsequent encounter for fracture with routine healing: Secondary | ICD-10-CM | POA: Diagnosis not present

## 2017-05-09 DIAGNOSIS — I69354 Hemiplegia and hemiparesis following cerebral infarction affecting left non-dominant side: Secondary | ICD-10-CM | POA: Diagnosis not present

## 2017-05-09 DIAGNOSIS — Z79891 Long term (current) use of opiate analgesic: Secondary | ICD-10-CM | POA: Diagnosis not present

## 2017-05-09 DIAGNOSIS — Z9181 History of falling: Secondary | ICD-10-CM | POA: Diagnosis not present

## 2017-05-09 DIAGNOSIS — Z7902 Long term (current) use of antithrombotics/antiplatelets: Secondary | ICD-10-CM | POA: Diagnosis not present

## 2017-05-09 DIAGNOSIS — I10 Essential (primary) hypertension: Secondary | ICD-10-CM | POA: Diagnosis not present

## 2017-05-15 DIAGNOSIS — Z7982 Long term (current) use of aspirin: Secondary | ICD-10-CM | POA: Diagnosis not present

## 2017-05-15 DIAGNOSIS — Z7952 Long term (current) use of systemic steroids: Secondary | ICD-10-CM | POA: Diagnosis not present

## 2017-05-15 DIAGNOSIS — S42202D Unspecified fracture of upper end of left humerus, subsequent encounter for fracture with routine healing: Secondary | ICD-10-CM | POA: Diagnosis not present

## 2017-05-15 DIAGNOSIS — Z96642 Presence of left artificial hip joint: Secondary | ICD-10-CM | POA: Diagnosis not present

## 2017-05-15 DIAGNOSIS — Z7902 Long term (current) use of antithrombotics/antiplatelets: Secondary | ICD-10-CM | POA: Diagnosis not present

## 2017-05-15 DIAGNOSIS — Z9181 History of falling: Secondary | ICD-10-CM | POA: Diagnosis not present

## 2017-05-15 DIAGNOSIS — Z79891 Long term (current) use of opiate analgesic: Secondary | ICD-10-CM | POA: Diagnosis not present

## 2017-05-15 DIAGNOSIS — I10 Essential (primary) hypertension: Secondary | ICD-10-CM | POA: Diagnosis not present

## 2017-05-15 DIAGNOSIS — I69354 Hemiplegia and hemiparesis following cerebral infarction affecting left non-dominant side: Secondary | ICD-10-CM | POA: Diagnosis not present

## 2017-05-16 DIAGNOSIS — Z7952 Long term (current) use of systemic steroids: Secondary | ICD-10-CM | POA: Diagnosis not present

## 2017-05-16 DIAGNOSIS — I10 Essential (primary) hypertension: Secondary | ICD-10-CM | POA: Diagnosis not present

## 2017-05-16 DIAGNOSIS — Z79891 Long term (current) use of opiate analgesic: Secondary | ICD-10-CM | POA: Diagnosis not present

## 2017-05-16 DIAGNOSIS — Z7982 Long term (current) use of aspirin: Secondary | ICD-10-CM | POA: Diagnosis not present

## 2017-05-16 DIAGNOSIS — Z7902 Long term (current) use of antithrombotics/antiplatelets: Secondary | ICD-10-CM | POA: Diagnosis not present

## 2017-05-16 DIAGNOSIS — L0390 Cellulitis, unspecified: Secondary | ICD-10-CM | POA: Diagnosis not present

## 2017-05-16 DIAGNOSIS — Z9181 History of falling: Secondary | ICD-10-CM | POA: Diagnosis not present

## 2017-05-16 DIAGNOSIS — I69354 Hemiplegia and hemiparesis following cerebral infarction affecting left non-dominant side: Secondary | ICD-10-CM | POA: Diagnosis not present

## 2017-05-16 DIAGNOSIS — Z96642 Presence of left artificial hip joint: Secondary | ICD-10-CM | POA: Diagnosis not present

## 2017-05-16 DIAGNOSIS — Z6821 Body mass index (BMI) 21.0-21.9, adult: Secondary | ICD-10-CM | POA: Diagnosis not present

## 2017-05-16 DIAGNOSIS — S80811A Abrasion, right lower leg, initial encounter: Secondary | ICD-10-CM | POA: Diagnosis not present

## 2017-05-16 DIAGNOSIS — L03116 Cellulitis of left lower limb: Secondary | ICD-10-CM | POA: Diagnosis not present

## 2017-05-16 DIAGNOSIS — S42202D Unspecified fracture of upper end of left humerus, subsequent encounter for fracture with routine healing: Secondary | ICD-10-CM | POA: Diagnosis not present

## 2017-05-18 DIAGNOSIS — I6789 Other cerebrovascular disease: Secondary | ICD-10-CM | POA: Diagnosis not present

## 2017-05-20 DIAGNOSIS — Z7902 Long term (current) use of antithrombotics/antiplatelets: Secondary | ICD-10-CM | POA: Diagnosis not present

## 2017-05-20 DIAGNOSIS — Z96642 Presence of left artificial hip joint: Secondary | ICD-10-CM | POA: Diagnosis not present

## 2017-05-20 DIAGNOSIS — Z7952 Long term (current) use of systemic steroids: Secondary | ICD-10-CM | POA: Diagnosis not present

## 2017-05-20 DIAGNOSIS — Z79891 Long term (current) use of opiate analgesic: Secondary | ICD-10-CM | POA: Diagnosis not present

## 2017-05-20 DIAGNOSIS — Z7982 Long term (current) use of aspirin: Secondary | ICD-10-CM | POA: Diagnosis not present

## 2017-05-20 DIAGNOSIS — I10 Essential (primary) hypertension: Secondary | ICD-10-CM | POA: Diagnosis not present

## 2017-05-20 DIAGNOSIS — S42202D Unspecified fracture of upper end of left humerus, subsequent encounter for fracture with routine healing: Secondary | ICD-10-CM | POA: Diagnosis not present

## 2017-05-20 DIAGNOSIS — Z9181 History of falling: Secondary | ICD-10-CM | POA: Diagnosis not present

## 2017-05-20 DIAGNOSIS — I69354 Hemiplegia and hemiparesis following cerebral infarction affecting left non-dominant side: Secondary | ICD-10-CM | POA: Diagnosis not present

## 2017-05-22 DIAGNOSIS — I10 Essential (primary) hypertension: Secondary | ICD-10-CM | POA: Diagnosis not present

## 2017-05-22 DIAGNOSIS — Z7982 Long term (current) use of aspirin: Secondary | ICD-10-CM | POA: Diagnosis not present

## 2017-05-22 DIAGNOSIS — S42202D Unspecified fracture of upper end of left humerus, subsequent encounter for fracture with routine healing: Secondary | ICD-10-CM | POA: Diagnosis not present

## 2017-05-22 DIAGNOSIS — Z7902 Long term (current) use of antithrombotics/antiplatelets: Secondary | ICD-10-CM | POA: Diagnosis not present

## 2017-05-22 DIAGNOSIS — Z79891 Long term (current) use of opiate analgesic: Secondary | ICD-10-CM | POA: Diagnosis not present

## 2017-05-22 DIAGNOSIS — Z9181 History of falling: Secondary | ICD-10-CM | POA: Diagnosis not present

## 2017-05-22 DIAGNOSIS — Z7952 Long term (current) use of systemic steroids: Secondary | ICD-10-CM | POA: Diagnosis not present

## 2017-05-22 DIAGNOSIS — I69354 Hemiplegia and hemiparesis following cerebral infarction affecting left non-dominant side: Secondary | ICD-10-CM | POA: Diagnosis not present

## 2017-05-22 DIAGNOSIS — Z96642 Presence of left artificial hip joint: Secondary | ICD-10-CM | POA: Diagnosis not present

## 2017-05-27 DIAGNOSIS — Z7952 Long term (current) use of systemic steroids: Secondary | ICD-10-CM | POA: Diagnosis not present

## 2017-05-27 DIAGNOSIS — Z7982 Long term (current) use of aspirin: Secondary | ICD-10-CM | POA: Diagnosis not present

## 2017-05-27 DIAGNOSIS — I69354 Hemiplegia and hemiparesis following cerebral infarction affecting left non-dominant side: Secondary | ICD-10-CM | POA: Diagnosis not present

## 2017-05-27 DIAGNOSIS — Z96642 Presence of left artificial hip joint: Secondary | ICD-10-CM | POA: Diagnosis not present

## 2017-05-27 DIAGNOSIS — Z79891 Long term (current) use of opiate analgesic: Secondary | ICD-10-CM | POA: Diagnosis not present

## 2017-05-27 DIAGNOSIS — I10 Essential (primary) hypertension: Secondary | ICD-10-CM | POA: Diagnosis not present

## 2017-05-27 DIAGNOSIS — Z9181 History of falling: Secondary | ICD-10-CM | POA: Diagnosis not present

## 2017-05-27 DIAGNOSIS — S42202D Unspecified fracture of upper end of left humerus, subsequent encounter for fracture with routine healing: Secondary | ICD-10-CM | POA: Diagnosis not present

## 2017-05-27 DIAGNOSIS — Z7902 Long term (current) use of antithrombotics/antiplatelets: Secondary | ICD-10-CM | POA: Diagnosis not present

## 2017-05-29 DIAGNOSIS — Z79891 Long term (current) use of opiate analgesic: Secondary | ICD-10-CM | POA: Diagnosis not present

## 2017-05-29 DIAGNOSIS — Z96642 Presence of left artificial hip joint: Secondary | ICD-10-CM | POA: Diagnosis not present

## 2017-05-29 DIAGNOSIS — S42202D Unspecified fracture of upper end of left humerus, subsequent encounter for fracture with routine healing: Secondary | ICD-10-CM | POA: Diagnosis not present

## 2017-05-29 DIAGNOSIS — Z9181 History of falling: Secondary | ICD-10-CM | POA: Diagnosis not present

## 2017-05-29 DIAGNOSIS — Z7902 Long term (current) use of antithrombotics/antiplatelets: Secondary | ICD-10-CM | POA: Diagnosis not present

## 2017-05-29 DIAGNOSIS — I69354 Hemiplegia and hemiparesis following cerebral infarction affecting left non-dominant side: Secondary | ICD-10-CM | POA: Diagnosis not present

## 2017-05-29 DIAGNOSIS — Z7982 Long term (current) use of aspirin: Secondary | ICD-10-CM | POA: Diagnosis not present

## 2017-05-29 DIAGNOSIS — Z7952 Long term (current) use of systemic steroids: Secondary | ICD-10-CM | POA: Diagnosis not present

## 2017-05-29 DIAGNOSIS — I10 Essential (primary) hypertension: Secondary | ICD-10-CM | POA: Diagnosis not present

## 2017-05-30 DIAGNOSIS — Z6821 Body mass index (BMI) 21.0-21.9, adult: Secondary | ICD-10-CM | POA: Diagnosis not present

## 2017-05-30 DIAGNOSIS — L110 Acquired keratosis follicularis: Secondary | ICD-10-CM | POA: Diagnosis not present

## 2017-05-30 DIAGNOSIS — M79672 Pain in left foot: Secondary | ICD-10-CM | POA: Diagnosis not present

## 2017-05-30 DIAGNOSIS — I739 Peripheral vascular disease, unspecified: Secondary | ICD-10-CM | POA: Diagnosis not present

## 2017-05-30 DIAGNOSIS — I639 Cerebral infarction, unspecified: Secondary | ICD-10-CM | POA: Diagnosis not present

## 2017-05-30 DIAGNOSIS — I6350 Cerebral infarction due to unspecified occlusion or stenosis of unspecified cerebral artery: Secondary | ICD-10-CM | POA: Diagnosis not present

## 2017-05-30 DIAGNOSIS — M79671 Pain in right foot: Secondary | ICD-10-CM | POA: Diagnosis not present

## 2017-05-30 DIAGNOSIS — R600 Localized edema: Secondary | ICD-10-CM | POA: Diagnosis not present

## 2017-05-30 DIAGNOSIS — M7989 Other specified soft tissue disorders: Secondary | ICD-10-CM | POA: Diagnosis not present

## 2017-05-30 DIAGNOSIS — L03116 Cellulitis of left lower limb: Secondary | ICD-10-CM | POA: Diagnosis not present

## 2017-06-04 DIAGNOSIS — Z7952 Long term (current) use of systemic steroids: Secondary | ICD-10-CM | POA: Diagnosis not present

## 2017-06-04 DIAGNOSIS — Z7902 Long term (current) use of antithrombotics/antiplatelets: Secondary | ICD-10-CM | POA: Diagnosis not present

## 2017-06-04 DIAGNOSIS — I10 Essential (primary) hypertension: Secondary | ICD-10-CM | POA: Diagnosis not present

## 2017-06-04 DIAGNOSIS — I69354 Hemiplegia and hemiparesis following cerebral infarction affecting left non-dominant side: Secondary | ICD-10-CM | POA: Diagnosis not present

## 2017-06-04 DIAGNOSIS — Z79891 Long term (current) use of opiate analgesic: Secondary | ICD-10-CM | POA: Diagnosis not present

## 2017-06-04 DIAGNOSIS — Z9181 History of falling: Secondary | ICD-10-CM | POA: Diagnosis not present

## 2017-06-04 DIAGNOSIS — S42202D Unspecified fracture of upper end of left humerus, subsequent encounter for fracture with routine healing: Secondary | ICD-10-CM | POA: Diagnosis not present

## 2017-06-04 DIAGNOSIS — Z96642 Presence of left artificial hip joint: Secondary | ICD-10-CM | POA: Diagnosis not present

## 2017-06-04 DIAGNOSIS — Z7982 Long term (current) use of aspirin: Secondary | ICD-10-CM | POA: Diagnosis not present

## 2017-06-06 ENCOUNTER — Ambulatory Visit (INDEPENDENT_AMBULATORY_CARE_PROVIDER_SITE_OTHER): Payer: Medicare Other | Admitting: Internal Medicine

## 2017-06-06 ENCOUNTER — Encounter (INDEPENDENT_AMBULATORY_CARE_PROVIDER_SITE_OTHER): Payer: Self-pay | Admitting: Internal Medicine

## 2017-06-06 VITALS — BP 130/82 | HR 72 | Temp 97.7°F | Resp 18 | Ht 60.0 in | Wt 118.3 lb

## 2017-06-06 DIAGNOSIS — Z96642 Presence of left artificial hip joint: Secondary | ICD-10-CM | POA: Diagnosis not present

## 2017-06-06 DIAGNOSIS — R1319 Other dysphagia: Secondary | ICD-10-CM

## 2017-06-06 DIAGNOSIS — S42202D Unspecified fracture of upper end of left humerus, subsequent encounter for fracture with routine healing: Secondary | ICD-10-CM | POA: Diagnosis not present

## 2017-06-06 DIAGNOSIS — Z79891 Long term (current) use of opiate analgesic: Secondary | ICD-10-CM | POA: Diagnosis not present

## 2017-06-06 DIAGNOSIS — I69354 Hemiplegia and hemiparesis following cerebral infarction affecting left non-dominant side: Secondary | ICD-10-CM | POA: Diagnosis not present

## 2017-06-06 DIAGNOSIS — I10 Essential (primary) hypertension: Secondary | ICD-10-CM | POA: Diagnosis not present

## 2017-06-06 DIAGNOSIS — R1310 Dysphagia, unspecified: Secondary | ICD-10-CM | POA: Diagnosis not present

## 2017-06-06 DIAGNOSIS — Z9181 History of falling: Secondary | ICD-10-CM | POA: Diagnosis not present

## 2017-06-06 DIAGNOSIS — Q396 Congenital diverticulum of esophagus: Secondary | ICD-10-CM

## 2017-06-06 DIAGNOSIS — Z7952 Long term (current) use of systemic steroids: Secondary | ICD-10-CM | POA: Diagnosis not present

## 2017-06-06 DIAGNOSIS — Z7902 Long term (current) use of antithrombotics/antiplatelets: Secondary | ICD-10-CM | POA: Diagnosis not present

## 2017-06-06 DIAGNOSIS — K225 Diverticulum of esophagus, acquired: Secondary | ICD-10-CM

## 2017-06-06 DIAGNOSIS — Z7982 Long term (current) use of aspirin: Secondary | ICD-10-CM | POA: Diagnosis not present

## 2017-06-06 NOTE — Progress Notes (Signed)
Presenting complaint;  Follow-up for dysphagia.  Database and subjective:  Patient is a 84 year old Caucasian female who was last seen on 03/26/2017 for esophageal dysphagia.  Barium study revealed 2 esophageal diverticula and abnormality to suggest motility disorder.  Patient was begun on low-dose diltiazem.  Patient is a resident of [facility] and is accompanied by her son [name] and employee of the facility. Patient states she is able to swallow well.  According to her son she had single episode of dysphagia while she was eating turnip greens few weeks ago.  This morning she was trying to swallow her pills rapidly and developed nausea and regurgitation of the pills.  She has not had similar problems in the past.  According to [employer] employee she eats well.  According to the records she has not lost any weight.  According to our records she has lost 5 pounds since her last visit.  She denies abdominal pain or melena. She wants to try regular diet rather than mechanical soft diet.   Current Medications: Outpatient Encounter Medications as of 06/06/2017  Medication Sig  . aspirin 81 MG chewable tablet Chew 81 mg by mouth daily.  . clopidogrel (PLAVIX) 75 MG tablet Take 75 mg by mouth daily.  Marland Kitchen diltiazem (CARDIZEM) 30 MG tablet Take 30 mg by mouth 3 (three) times daily before meals. For swallowing before meals   . docusate sodium (COLACE) 100 MG capsule Take 1 capsule (100 mg total) by mouth 2 (two) times daily.  Marland Kitchen doxycycline (VIBRA-TABS) 100 MG tablet Take 100 mg by mouth 2 (two) times daily. For 7 days  . ENSURE (ENSURE) Take 237 mLs by mouth 2 (two) times daily between meals.  Marland Kitchen guaiFENesin-dextromethorphan (ROBITUSSIN DM) 100-10 MG/5ML syrup Take 5 mLs by mouth every 4 (four) hours as needed for cough.  . meprobamate (EQUANIL) 200 MG tablet Take 200 mg 3 (three) times daily by mouth.  . methocarbamol (ROBAXIN) 500 MG tablet Take 1 tablet (500 mg total) by mouth every 8 (eight) hours as  needed for muscle spasms.  . Multiple Vitamins-Minerals (CENTRUM SILVER PO) Take 1 tablet by mouth daily.   Marland Kitchen oxybutynin (DITROPAN) 5 MG tablet Take 5 mg by mouth every 8 (eight) hours as needed for bladder spasms.  Marland Kitchen oxyCODONE-acetaminophen (PERCOCET/ROXICET) 5-325 MG tablet Take 1 tablet by mouth every 4 (four) hours as needed for moderate pain.  . polyethylene glycol (MIRALAX / GLYCOLAX) packet Take 17 g by mouth daily as needed for mild constipation.  . senna (SENOKOT) 8.6 MG TABS tablet Take 1 tablet (8.6 mg total) by mouth 2 (two) times daily.  . sertraline (ZOLOFT) 50 MG tablet Take 50 mg by mouth daily.  Marland Kitchen terbinafine (LAMISIL) 250 MG tablet Take 250 mg by mouth daily. Patient is on for 12 weeks  . vitamin B-12 (CYANOCOBALAMIN) 100 MCG tablet Take 100 mcg by mouth daily.  Marland Kitchen albuterol (PROVENTIL) 2 MG tablet Take 2 mg by mouth 2 (two) times daily.  . ciprofloxacin (CIPRO) 500 MG tablet Take 500 mg 2 (two) times daily by mouth.  . [DISCONTINUED] Diclofenac Sodium-Menthol (DITHOL EX) Apply topically.   No facility-administered encounter medications on file as of 06/06/2017.      Objective: Blood pressure 130/82, pulse 72, temperature 97.7 F (36.5 C), temperature source Oral, resp. rate 18, height 5' (1.524 m), weight 118 lb 4.8 oz (53.7 kg). Patient is alert and in no acute distress. She is using her walker/chair. Conjunctiva is pink. Sclera is nonicteric Oropharyngeal mucosa is normal.  No neck masses or thyromegaly noted. Cardiac exam with regular rhythm normal S1 and S2. No murmur or gallop noted. Lungs are clear to auscultation. Abdomentake oxygen at abdomen is soft and nontender without organomegaly or masses. No LE edema or clubbing noted.  Labs/studies Results:  Barium pill esophagogram reviewed with patient and her son Aurther Lofterry.   Assessment:  #1.  Esophageal dysphagia secondary to motility disorder.  She has developed to esophageal diverticulum.  1 of these diverticuli  is quite large.  She seemed to have responded to calcium channel blocker which she is tolerating well.  As long as she is doing well she would not need endoscopic evaluation and/or dilation.   Plan:  Patient can be on combination of heart healthy and mechanical soft diet.  Foods that seem to give her trouble should be planted and other food she can eat as such. Patient advised to swallow pills slowly and one at a time. She will call if dysphagia recurs in which case we will consider EGD with ED. Continue diltiazem at current dose of 30 mg by mouth 30 minutes before each meal. Office visit on as-needed basis.

## 2017-06-06 NOTE — Patient Instructions (Signed)
Notify if swallowing difficulty recurs.

## 2017-06-07 ENCOUNTER — Encounter (INDEPENDENT_AMBULATORY_CARE_PROVIDER_SITE_OTHER): Payer: Self-pay | Admitting: *Deleted

## 2017-06-07 ENCOUNTER — Telehealth (INDEPENDENT_AMBULATORY_CARE_PROVIDER_SITE_OTHER): Payer: Self-pay | Admitting: *Deleted

## 2017-06-07 DIAGNOSIS — R195 Other fecal abnormalities: Secondary | ICD-10-CM

## 2017-06-07 NOTE — Telephone Encounter (Signed)
Patient's lab has been completed, and a letter will be sent as a reminder.

## 2017-06-10 DIAGNOSIS — Z7952 Long term (current) use of systemic steroids: Secondary | ICD-10-CM | POA: Diagnosis not present

## 2017-06-10 DIAGNOSIS — S42202D Unspecified fracture of upper end of left humerus, subsequent encounter for fracture with routine healing: Secondary | ICD-10-CM | POA: Diagnosis not present

## 2017-06-10 DIAGNOSIS — I10 Essential (primary) hypertension: Secondary | ICD-10-CM | POA: Diagnosis not present

## 2017-06-10 DIAGNOSIS — Z7902 Long term (current) use of antithrombotics/antiplatelets: Secondary | ICD-10-CM | POA: Diagnosis not present

## 2017-06-10 DIAGNOSIS — I69354 Hemiplegia and hemiparesis following cerebral infarction affecting left non-dominant side: Secondary | ICD-10-CM | POA: Diagnosis not present

## 2017-06-10 DIAGNOSIS — Z96642 Presence of left artificial hip joint: Secondary | ICD-10-CM | POA: Diagnosis not present

## 2017-06-10 DIAGNOSIS — Z9181 History of falling: Secondary | ICD-10-CM | POA: Diagnosis not present

## 2017-06-10 DIAGNOSIS — Z7982 Long term (current) use of aspirin: Secondary | ICD-10-CM | POA: Diagnosis not present

## 2017-06-10 DIAGNOSIS — Z79891 Long term (current) use of opiate analgesic: Secondary | ICD-10-CM | POA: Diagnosis not present

## 2017-06-12 DIAGNOSIS — Z7952 Long term (current) use of systemic steroids: Secondary | ICD-10-CM | POA: Diagnosis not present

## 2017-06-12 DIAGNOSIS — Z7982 Long term (current) use of aspirin: Secondary | ICD-10-CM | POA: Diagnosis not present

## 2017-06-12 DIAGNOSIS — I10 Essential (primary) hypertension: Secondary | ICD-10-CM | POA: Diagnosis not present

## 2017-06-12 DIAGNOSIS — S42202D Unspecified fracture of upper end of left humerus, subsequent encounter for fracture with routine healing: Secondary | ICD-10-CM | POA: Diagnosis not present

## 2017-06-12 DIAGNOSIS — Z9181 History of falling: Secondary | ICD-10-CM | POA: Diagnosis not present

## 2017-06-12 DIAGNOSIS — Z79891 Long term (current) use of opiate analgesic: Secondary | ICD-10-CM | POA: Diagnosis not present

## 2017-06-12 DIAGNOSIS — Z7902 Long term (current) use of antithrombotics/antiplatelets: Secondary | ICD-10-CM | POA: Diagnosis not present

## 2017-06-12 DIAGNOSIS — I69354 Hemiplegia and hemiparesis following cerebral infarction affecting left non-dominant side: Secondary | ICD-10-CM | POA: Diagnosis not present

## 2017-06-12 DIAGNOSIS — Z96642 Presence of left artificial hip joint: Secondary | ICD-10-CM | POA: Diagnosis not present

## 2017-06-18 DIAGNOSIS — I6789 Other cerebrovascular disease: Secondary | ICD-10-CM | POA: Diagnosis not present

## 2017-06-20 DIAGNOSIS — I779 Disorder of arteries and arterioles, unspecified: Secondary | ICD-10-CM | POA: Diagnosis not present

## 2017-06-20 DIAGNOSIS — Z1389 Encounter for screening for other disorder: Secondary | ICD-10-CM | POA: Diagnosis not present

## 2017-06-20 DIAGNOSIS — Z6821 Body mass index (BMI) 21.0-21.9, adult: Secondary | ICD-10-CM | POA: Diagnosis not present

## 2017-06-20 DIAGNOSIS — M1991 Primary osteoarthritis, unspecified site: Secondary | ICD-10-CM | POA: Diagnosis not present

## 2017-06-20 DIAGNOSIS — R5383 Other fatigue: Secondary | ICD-10-CM | POA: Diagnosis not present

## 2017-06-22 ENCOUNTER — Encounter (HOSPITAL_COMMUNITY): Payer: Self-pay | Admitting: Emergency Medicine

## 2017-06-22 ENCOUNTER — Other Ambulatory Visit: Payer: Self-pay

## 2017-06-22 ENCOUNTER — Inpatient Hospital Stay (HOSPITAL_COMMUNITY)
Admission: EM | Admit: 2017-06-22 | Discharge: 2017-06-24 | DRG: 292 | Disposition: A | Discharge status: Home Health | Payer: Medicare Other | Attending: Internal Medicine | Admitting: Internal Medicine

## 2017-06-22 ENCOUNTER — Emergency Department (HOSPITAL_COMMUNITY): Payer: Medicare Other

## 2017-06-22 DIAGNOSIS — F419 Anxiety disorder, unspecified: Secondary | ICD-10-CM | POA: Diagnosis present

## 2017-06-22 DIAGNOSIS — Z800 Family history of malignant neoplasm of digestive organs: Secondary | ICD-10-CM | POA: Diagnosis not present

## 2017-06-22 DIAGNOSIS — Z8249 Family history of ischemic heart disease and other diseases of the circulatory system: Secondary | ICD-10-CM | POA: Diagnosis not present

## 2017-06-22 DIAGNOSIS — Z66 Do not resuscitate: Secondary | ICD-10-CM | POA: Diagnosis not present

## 2017-06-22 DIAGNOSIS — E785 Hyperlipidemia, unspecified: Secondary | ICD-10-CM | POA: Diagnosis not present

## 2017-06-22 DIAGNOSIS — J440 Chronic obstructive pulmonary disease with acute lower respiratory infection: Secondary | ICD-10-CM

## 2017-06-22 DIAGNOSIS — Z8673 Personal history of transient ischemic attack (TIA), and cerebral infarction without residual deficits: Secondary | ICD-10-CM | POA: Diagnosis not present

## 2017-06-22 DIAGNOSIS — G8929 Other chronic pain: Secondary | ICD-10-CM | POA: Diagnosis present

## 2017-06-22 DIAGNOSIS — J470 Bronchiectasis with acute lower respiratory infection: Secondary | ICD-10-CM | POA: Diagnosis not present

## 2017-06-22 DIAGNOSIS — J209 Acute bronchitis, unspecified: Secondary | ICD-10-CM | POA: Diagnosis present

## 2017-06-22 DIAGNOSIS — I340 Nonrheumatic mitral (valve) insufficiency: Secondary | ICD-10-CM | POA: Diagnosis not present

## 2017-06-22 DIAGNOSIS — J810 Acute pulmonary edema: Secondary | ICD-10-CM | POA: Diagnosis not present

## 2017-06-22 DIAGNOSIS — Z8052 Family history of malignant neoplasm of bladder: Secondary | ICD-10-CM | POA: Diagnosis not present

## 2017-06-22 DIAGNOSIS — R0602 Shortness of breath: Secondary | ICD-10-CM | POA: Diagnosis not present

## 2017-06-22 DIAGNOSIS — I10 Essential (primary) hypertension: Secondary | ICD-10-CM | POA: Diagnosis not present

## 2017-06-22 DIAGNOSIS — Z96642 Presence of left artificial hip joint: Secondary | ICD-10-CM | POA: Diagnosis not present

## 2017-06-22 DIAGNOSIS — L89151 Pressure ulcer of sacral region, stage 1: Secondary | ICD-10-CM | POA: Diagnosis present

## 2017-06-22 DIAGNOSIS — I110 Hypertensive heart disease with heart failure: Secondary | ICD-10-CM | POA: Diagnosis not present

## 2017-06-22 DIAGNOSIS — K219 Gastro-esophageal reflux disease without esophagitis: Secondary | ICD-10-CM | POA: Diagnosis present

## 2017-06-22 DIAGNOSIS — R0603 Acute respiratory distress: Secondary | ICD-10-CM | POA: Diagnosis present

## 2017-06-22 DIAGNOSIS — I639 Cerebral infarction, unspecified: Secondary | ICD-10-CM | POA: Diagnosis present

## 2017-06-22 DIAGNOSIS — Z7982 Long term (current) use of aspirin: Secondary | ICD-10-CM

## 2017-06-22 DIAGNOSIS — J441 Chronic obstructive pulmonary disease with (acute) exacerbation: Secondary | ICD-10-CM

## 2017-06-22 DIAGNOSIS — I5033 Acute on chronic diastolic (congestive) heart failure: Secondary | ICD-10-CM | POA: Diagnosis not present

## 2017-06-22 DIAGNOSIS — L8990 Pressure ulcer of unspecified site, unspecified stage: Secondary | ICD-10-CM

## 2017-06-22 DIAGNOSIS — F329 Major depressive disorder, single episode, unspecified: Secondary | ICD-10-CM | POA: Diagnosis present

## 2017-06-22 DIAGNOSIS — Z7902 Long term (current) use of antithrombotics/antiplatelets: Secondary | ICD-10-CM | POA: Diagnosis not present

## 2017-06-22 DIAGNOSIS — Z23 Encounter for immunization: Secondary | ICD-10-CM

## 2017-06-22 DIAGNOSIS — J471 Bronchiectasis with (acute) exacerbation: Secondary | ICD-10-CM | POA: Diagnosis present

## 2017-06-22 DIAGNOSIS — R05 Cough: Secondary | ICD-10-CM | POA: Diagnosis not present

## 2017-06-22 LAB — CBC WITH DIFFERENTIAL/PLATELET
Basophils Absolute: 0 10*3/uL (ref 0.0–0.1)
Basophils Relative: 0 %
Eosinophils Absolute: 0.3 10*3/uL (ref 0.0–0.7)
Eosinophils Relative: 5 %
HEMATOCRIT: 36.4 % (ref 36.0–46.0)
Hemoglobin: 11.6 g/dL — ABNORMAL LOW (ref 12.0–15.0)
LYMPHS ABS: 0.7 10*3/uL (ref 0.7–4.0)
Lymphocytes Relative: 12 %
MCH: 30 pg (ref 26.0–34.0)
MCHC: 31.9 g/dL (ref 30.0–36.0)
MCV: 94.1 fL (ref 78.0–100.0)
MONO ABS: 0.6 10*3/uL (ref 0.1–1.0)
MONOS PCT: 11 %
NEUTROS ABS: 4.1 10*3/uL (ref 1.7–7.7)
Neutrophils Relative %: 72 %
Platelets: 150 10*3/uL (ref 150–400)
RBC: 3.87 MIL/uL (ref 3.87–5.11)
RDW: 14.8 % (ref 11.5–15.5)
WBC: 5.8 10*3/uL (ref 4.0–10.5)

## 2017-06-22 LAB — BRAIN NATRIURETIC PEPTIDE: B Natriuretic Peptide: 260 pg/mL — ABNORMAL HIGH (ref 0.0–100.0)

## 2017-06-22 LAB — BASIC METABOLIC PANEL
Anion gap: 10 (ref 5–15)
BUN: 19 mg/dL (ref 6–20)
CALCIUM: 9.5 mg/dL (ref 8.9–10.3)
CO2: 24 mmol/L (ref 22–32)
CREATININE: 0.86 mg/dL (ref 0.44–1.00)
Chloride: 106 mmol/L (ref 101–111)
GFR calc non Af Amer: 56 mL/min — ABNORMAL LOW (ref >60)
GLUCOSE: 93 mg/dL (ref 65–99)
Potassium: 4.2 mmol/L (ref 3.5–5.1)
Sodium: 140 mmol/L (ref 135–145)

## 2017-06-22 LAB — INFLUENZA PANEL BY PCR (TYPE A & B)
Influenza A By PCR: NEGATIVE
Influenza B By PCR: NEGATIVE

## 2017-06-22 LAB — MAGNESIUM: Magnesium: 1.8 mg/dL (ref 1.7–2.4)

## 2017-06-22 LAB — MRSA PCR SCREENING: MRSA BY PCR: NEGATIVE

## 2017-06-22 LAB — GLUCOSE, CAPILLARY: Glucose-Capillary: 184 mg/dL — ABNORMAL HIGH (ref 65–99)

## 2017-06-22 LAB — TROPONIN I
TROPONIN I: 0.04 ng/mL — AB (ref <0.03)
TROPONIN I: 0.03 ng/mL — AB (ref <0.03)
Troponin I: 0.04 ng/mL (ref <0.03)
Troponin I: 0.03 ng/mL (ref <0.03)

## 2017-06-22 LAB — TSH: TSH: 2.05 u[IU]/mL (ref 0.350–4.500)

## 2017-06-22 MED ORDER — POLYETHYLENE GLYCOL 3350 17 G PO PACK: 17.0000 g | PACK | Freq: Every day | ORAL | Status: DC | PRN

## 2017-06-22 MED ORDER — IPRATROPIUM-ALBUTEROL 0.5-2.5 (3) MG/3ML IN SOLN
3.0000 mL | Freq: Four times a day (QID) | RESPIRATORY_TRACT | Status: DC
  Administered 2017-06-22 – 2017-06-24 (×9): 3 mL via RESPIRATORY_TRACT
  Filled 2017-06-22 (×10): qty 3

## 2017-06-22 MED ORDER — ACETAMINOPHEN 325 MG PO TABS: 650.0000 mg | ORAL_TABLET | ORAL | Status: DC | PRN

## 2017-06-22 MED ORDER — SODIUM CHLORIDE 0.9% FLUSH: 3.0000 mL | INTRAVENOUS | Status: DC | PRN

## 2017-06-22 MED ORDER — BUDESONIDE 0.25 MG/2ML IN SUSP
0.2500 mg | Freq: Two times a day (BID) | RESPIRATORY_TRACT | Status: DC
  Administered 2017-06-22 – 2017-06-24 (×4): 0.25 mg via RESPIRATORY_TRACT
  Filled 2017-06-22 (×4): qty 2

## 2017-06-22 MED ORDER — POTASSIUM CHLORIDE CRYS ER 20 MEQ PO TBCR
20.0000 meq | EXTENDED_RELEASE_TABLET | Freq: Every day | ORAL | Status: DC
  Administered 2017-06-22 – 2017-06-24 (×3): 20 meq via ORAL
  Filled 2017-06-22 (×3): qty 1

## 2017-06-22 MED ORDER — METHYLPREDNISOLONE SODIUM SUCC 40 MG IJ SOLR
40.0000 mg | Freq: Three times a day (TID) | INTRAMUSCULAR | Status: DC
  Administered 2017-06-22 – 2017-06-24 (×8): 40 mg via INTRAVENOUS
  Filled 2017-06-22 (×8): qty 1

## 2017-06-22 MED ORDER — DOXYCYCLINE HYCLATE 100 MG PO TABS
100.0000 mg | ORAL_TABLET | Freq: Two times a day (BID) | ORAL | Status: DC
  Administered 2017-06-22 – 2017-06-24 (×5): 100 mg via ORAL
  Filled 2017-06-22 (×5): qty 1

## 2017-06-22 MED ORDER — VITAMIN B-12 100 MCG PO TABS
100.0000 ug | ORAL_TABLET | Freq: Every day | ORAL | Status: DC
  Administered 2017-06-22 – 2017-06-24 (×3): 100 ug via ORAL
  Filled 2017-06-22 (×3): qty 1

## 2017-06-22 MED ORDER — HEPARIN SODIUM (PORCINE) 5000 UNIT/ML IJ SOLN
5000.0000 [IU] | Freq: Three times a day (TID) | INTRAMUSCULAR | Status: DC
  Administered 2017-06-22 – 2017-06-24 (×7): 5000 [IU] via SUBCUTANEOUS
  Filled 2017-06-22 (×7): qty 1

## 2017-06-22 MED ORDER — INFLUENZA VAC SPLIT HIGH-DOSE 0.5 ML IM SUSY
0.5000 mL | PREFILLED_SYRINGE | INTRAMUSCULAR | Status: AC
  Administered 2017-06-23: 0.5 mL via INTRAMUSCULAR
  Filled 2017-06-22: qty 0.5

## 2017-06-22 MED ORDER — ENSURE ENLIVE PO LIQD
237.0000 mL | Freq: Two times a day (BID) | ORAL | Status: DC
  Administered 2017-06-22 – 2017-06-24 (×6): 237 mL via ORAL

## 2017-06-22 MED ORDER — DILTIAZEM HCL 30 MG PO TABS
30.0000 mg | ORAL_TABLET | Freq: Two times a day (BID) | ORAL | Status: DC
  Administered 2017-06-22 – 2017-06-24 (×5): 30 mg via ORAL
  Filled 2017-06-22 (×5): qty 1

## 2017-06-22 MED ORDER — SERTRALINE HCL 50 MG PO TABS
50.0000 mg | ORAL_TABLET | Freq: Every day | ORAL | Status: DC
  Administered 2017-06-22 – 2017-06-24 (×3): 50 mg via ORAL
  Filled 2017-06-22 (×3): qty 1

## 2017-06-22 MED ORDER — ONDANSETRON HCL 4 MG/2ML IJ SOLN: 4.0000 mg | Freq: Four times a day (QID) | INTRAMUSCULAR | Status: DC | PRN

## 2017-06-22 MED ORDER — ASPIRIN 81 MG PO CHEW
81.0000 mg | CHEWABLE_TABLET | Freq: Every day | ORAL | Status: DC
  Administered 2017-06-22 – 2017-06-24 (×3): 81 mg via ORAL
  Filled 2017-06-22 (×3): qty 1

## 2017-06-22 MED ORDER — DM-GUAIFENESIN ER 30-600 MG PO TB12
1.0000 | ORAL_TABLET | Freq: Two times a day (BID) | ORAL | Status: DC
  Administered 2017-06-22 – 2017-06-24 (×5): 1 via ORAL
  Filled 2017-06-22 (×5): qty 1

## 2017-06-22 MED ORDER — PNEUMOCOCCAL VAC POLYVALENT 25 MCG/0.5ML IJ INJ
0.5000 mL | INJECTION | INTRAMUSCULAR | Status: AC
Start: 2017-06-23 — End: 2017-06-23
  Administered 2017-06-23: 0.5 mL via INTRAMUSCULAR
  Filled 2017-06-22: qty 0.5

## 2017-06-22 MED ORDER — OXYBUTYNIN CHLORIDE 5 MG PO TABS: 5.0000 mg | ORAL_TABLET | Freq: Three times a day (TID) | ORAL | Status: DC | PRN

## 2017-06-22 MED ORDER — ALBUTEROL SULFATE (2.5 MG/3ML) 0.083% IN NEBU
5.0000 mg | INHALATION_SOLUTION | Freq: Once | RESPIRATORY_TRACT | Status: AC
  Administered 2017-06-22: 5 mg via RESPIRATORY_TRACT
  Filled 2017-06-22: qty 6

## 2017-06-22 MED ORDER — FUROSEMIDE 10 MG/ML IJ SOLN
20.0000 mg | Freq: Two times a day (BID) | INTRAMUSCULAR | Status: DC
  Administered 2017-06-22 – 2017-06-24 (×5): 20 mg via INTRAVENOUS
  Filled 2017-06-22 (×5): qty 2

## 2017-06-22 MED ORDER — LEVOFLOXACIN IN D5W 500 MG/100ML IV SOLN
500.0000 mg | Freq: Once | INTRAVENOUS | Status: AC
  Administered 2017-06-22: 500 mg via INTRAVENOUS
  Filled 2017-06-22: qty 100

## 2017-06-22 MED ORDER — CLOPIDOGREL BISULFATE 75 MG PO TABS
75.0000 mg | ORAL_TABLET | Freq: Every day | ORAL | Status: DC
  Administered 2017-06-22 – 2017-06-24 (×3): 75 mg via ORAL
  Filled 2017-06-22 (×3): qty 1

## 2017-06-22 MED ORDER — FUROSEMIDE 10 MG/ML IJ SOLN
20.0000 mg | Freq: Once | INTRAMUSCULAR | Status: AC
  Administered 2017-06-22: 20 mg via INTRAVENOUS
  Filled 2017-06-22: qty 2

## 2017-06-22 MED ORDER — IPRATROPIUM-ALBUTEROL 0.5-2.5 (3) MG/3ML IN SOLN
3.0000 mL | Freq: Once | RESPIRATORY_TRACT | Status: AC
  Administered 2017-06-22: 3 mL via RESPIRATORY_TRACT
  Filled 2017-06-22: qty 3

## 2017-06-22 MED ORDER — DOCUSATE SODIUM 100 MG PO CAPS
100.0000 mg | ORAL_CAPSULE | Freq: Two times a day (BID) | ORAL | Status: DC
  Administered 2017-06-22 – 2017-06-24 (×5): 100 mg via ORAL
  Filled 2017-06-22 (×5): qty 1

## 2017-06-22 MED ORDER — MEPROBAMATE 400 MG PO TABS
200.0000 mg | ORAL_TABLET | Freq: Three times a day (TID) | ORAL | Status: DC
  Administered 2017-06-22 – 2017-06-24 (×7): 200 mg via ORAL
  Filled 2017-06-22 (×7): qty 1

## 2017-06-22 MED ORDER — SODIUM CHLORIDE 0.9 % IV SOLN: 250.0000 mL | INTRAVENOUS | Status: DC | PRN

## 2017-06-22 MED ORDER — IPRATROPIUM-ALBUTEROL 0.5-2.5 (3) MG/3ML IN SOLN
3.0000 mL | Freq: Once | RESPIRATORY_TRACT | Status: AC
Start: 2017-06-22 — End: 2017-06-22
  Administered 2017-06-22: 3 mL via RESPIRATORY_TRACT
  Filled 2017-06-22: qty 3

## 2017-06-22 MED ORDER — METHYLPREDNISOLONE SODIUM SUCC 125 MG IJ SOLR
80.0000 mg | Freq: Once | INTRAMUSCULAR | Status: AC
  Administered 2017-06-22: 80 mg via INTRAVENOUS
  Filled 2017-06-22: qty 2

## 2017-06-22 MED ORDER — OXYCODONE-ACETAMINOPHEN 5-325 MG PO TABS
1.0000 | ORAL_TABLET | Freq: Four times a day (QID) | ORAL | Status: DC | PRN
  Administered 2017-06-22: 1 via ORAL
  Filled 2017-06-22 (×2): qty 1

## 2017-06-22 MED ORDER — SODIUM CHLORIDE 0.9% FLUSH
3.0000 mL | Freq: Two times a day (BID) | INTRAVENOUS | Status: DC
  Administered 2017-06-22 – 2017-06-24 (×5): 3 mL via INTRAVENOUS

## 2017-06-22 NOTE — Progress Notes (Addendum)
CRITICAL VALUE ALERT  Critical Value:  Troponin 0.03    Date & Time Notied:  06/22/2017 at 1600  Provider Notified: Dr. Gwenlyn PerkingMadera  [facility] Re[facility]Actions taken[facility]

## 2017-06-22 NOTE — ED Notes (Signed)
Date and time results received: 06/22/17 6:36 AM Test: troponin Critical Value: 0.04  Name of Provider Notified: dr.rancour  Orders Received? Or Actions Taken?: md notified.

## 2017-06-22 NOTE — ED Triage Notes (Signed)
Pt brought in by son, pt lives at [facility]. Pt says that shes been coughing for a week and last night had some shortness of breath while laying down. Denies having any pain.

## 2017-06-22 NOTE — H&P (Signed)
History and Physical    Debbie Musselreva T Varnadore WUX:[id number] DOB: 12/03/1920 DOA: 06/22/2017  PCP: Elfredia NevinsFusco, Lawrence, MD   I have briefly reviewed patients previous medical reports in [facility].  Patient coming from: Assisted living facility  Chief Complaint: Shortness of breath, productive cough, orthopnea, increased wheezing and lower extremity swelling.  HPI: Debbie Webb is a 84 year old female with a past medical history significant for hypertension, diastolic heart failure, gastroesophageal reflux disease, COPD and prior stroke (with mild to moderate left side hemiparesis as residual deficit); who was brought to the emergency department by her son secondary to increased shortness of breath.  Patient reports that for the last 4 days she has noticed increase shortness of breath, difficulty breathing with activity, productive cough and just not feeling good.  Patient denies chest pain, palpitations, fever/chills, nausea, vomiting, abdominal pain, dysuria, melena or hematochezia. Patient has also noticed orthopnea and increased swelling in her lower extremities.  Of note, son other residents at the assisted living facility has demonstrated positive upper respiratory infection symptoms.  Patient denies direct sick contacts.  ED Course: Elevated BNP, chest x-ray demonstrating vascular congestion and interstitial edema (no acute infiltrate), negative influenza by PCR.  Patient with expiratory wheezing, crackles, increased lower extremity edema.  Given nebulizer treatment, IV Lasix, IV antibiotics and steroids.  Started on oxygen supplementation to facilitate/assist with her respiratory distress.  TRH called to admit the patient for further evaluation and treatment.  Review of Systems:  All other systems reviewed and apart from HPI, are negative.  Past Medical History:  Diagnosis Date  . Carotid artery disease (HCC)    Status post carotid endarterectomy x2  . Essential hypertension, benign   .  Hemiparesis affecting left side as late effect of cerebrovascular accident (HCC) 08/03/2011  . Stroke (HCC) 1997  . Tremor     Past Surgical History:  Procedure Laterality Date  . ANTERIOR APPROACH HEMI HIP ARTHROPLASTY Left 12/28/2016   Procedure: ANTERIOR APPROACH HEMI HIP ARTHROPLASTY;  Surgeon: Samson FredericSwinteck, Brian, MD;  Location: [facility] OR;  Service: Orthopedics;  Laterality: Left;  . CAROTID ENDARTERECTOMY     Right    Social History  reports that  has never smoked. she has never used smokeless tobacco. She reports that she does not drink alcohol or use drugs.  Allergies  Allergen Reactions  . No Known Allergies     Family History  Problem Relation Age of Onset  . Colon cancer Mother   . Heart disease Mother   . Bladder Cancer Brother   . Cirrhosis Brother        Liver cirrhosis     Prior to Admission medications   Medication Sig Start Date End Date Taking? Authorizing Provider  albuterol (PROVENTIL) 2 MG tablet Take 2 mg by mouth 2 (two) times daily.    [provider]  aspirin 81 MG chewable tablet Chew 81 mg by mouth daily.    [provider]  ciprofloxacin (CIPRO) 500 MG tablet Take 500 mg 2 (two) times daily by mouth.    [provider]  clopidogrel (PLAVIX) 75 MG tablet Take 75 mg by mouth daily.    [provider]  diltiazem (CARDIZEM) 30 MG tablet Take 30 mg by mouth 3 (three) times daily before meals. For swallowing before meals     [provider]  docusate sodium (COLACE) 100 MG capsule Take 1 capsule (100 mg total) by mouth 2 (two) times daily. 12/31/16   Glade LloydAlekh, Kshitiz, MD  doxycycline (  VIBRA-TABS) 100 MG tablet Take 100 mg by mouth 2 (two) times daily. For 7 days    [provider]  ENSURE (ENSURE) Take 237 mLs by mouth 2 (two) times daily between meals.    [provider]  guaiFENesin-dextromethorphan (ROBITUSSIN DM) 100-10 MG/5ML syrup Take 5 mLs by mouth every 4 (four) hours as needed for cough. 01/31/17    Dhungel, Theda Belfast, MD  meprobamate (EQUANIL) 200 MG tablet Take 200 mg 3 (three) times daily by mouth.    [provider]  methocarbamol (ROBAXIN) 500 MG tablet Take 1 tablet (500 mg total) by mouth every 8 (eight) hours as needed for muscle spasms. 12/31/16   Glade Lloyd, MD  Multiple Vitamins-Minerals (CENTRUM SILVER PO) Take 1 tablet by mouth daily.     [provider]  oxybutynin (DITROPAN) 5 MG tablet Take 5 mg by mouth every 8 (eight) hours as needed for bladder spasms.    [provider]  oxyCODONE-acetaminophen (PERCOCET/ROXICET) 5-325 MG tablet Take 1 tablet by mouth every 4 (four) hours as needed for moderate pain. 12/31/16   Glade Lloyd, MD  polyethylene glycol (MIRALAX / GLYCOLAX) packet Take 17 g by mouth daily as needed for mild constipation. 12/31/16   Glade Lloyd, MD  senna (SENOKOT) 8.6 MG TABS tablet Take 1 tablet (8.6 mg total) by mouth 2 (two) times daily. 12/31/16   Glade Lloyd, MD  sertraline (ZOLOFT) 50 MG tablet Take 50 mg by mouth daily.    [provider]  terbinafine (LAMISIL) 250 MG tablet Take 250 mg by mouth daily. Patient is on for 12 weeks    [provider]  vitamin B-12 (CYANOCOBALAMIN) 100 MCG tablet Take 100 mcg by mouth daily.    [provider]    Physical Exam: Vitals:   06/22/17 0600 06/22/17 0629 06/22/17 0630 06/22/17 0802  BP: (!) 172/59  (!) 180/60   Pulse: 77     Resp: (!) 29  (!) 29   Temp:      TempSrc:      SpO2: 96% 95% 100% 96%  Weight:      Height:        Constitutional: Afebrile, denying chest pain, patient reports being short of breath and having orthopnea.  Breathing is slightly better after Lasix, nebulizer treatment and oxygen supplementation given NAD. Eyes: PERTLA, lids and conjunctivae normal, no icterus, no nystagmus ENMT: Mucous membranes are moist. Posterior pharynx clear of any exudate or lesions. No thrush.  Neck: supple, no masses, no thyromegaly, no carotid  bruits appreciated. Respiratory: Diffuse rhonchi, positive expiratory wheezing, bibasilar crackles, fair air movement; positive tachypnea.    Cardiovascular: S1 & S2 heard, regular rate and rhythm, no murmurs / rubs / gallops.  Mild JVD on exam.    Abdomen: No distension, no tenderness, no masses palpated. No hepatosplenomegaly. Bowel sounds normal.  Musculoskeletal: no clubbing / cyanosis. No joint deformity upper and lower extremities. Good ROM, no contractures.  1-2+ lower extremity edema bilaterally up to her knees.  Skin: no rashes, lesions, ulcers. No induration Neurologic: CN 2-12 grossly intact. Sensation intact, moving 4 limbs spontaneously; Strength 3-4/5 (L > right) due to poor effort and residual effect from previous stroke.  Psychiatric: Normal judgment and insight. Normal mood.  Alert, awake and oriented x3.    Labs on Admission: I have personally reviewed following labs and imaging studies  CBC: Recent Labs  Lab 06/22/17 0523  WBC 5.8  NEUTROABS 4.1  HGB 11.6*  HCT 36.4  MCV  94.1  PLT 150   Basic Metabolic Panel: Recent Labs  Lab 06/22/17 0523  NA 140  K 4.2  CL 106  CO2 24  GLUCOSE 93  BUN 19  CREATININE 0.86  CALCIUM 9.5   Cardiac Enzymes: Recent Labs  Lab 06/22/17 0523  TROPONINI 0.04*   Urine analysis:    Component Value Date/Time   COLORURINE YELLOW 12/27/2016 0255   APPEARANCEUR CLOUDY (A) 12/27/2016 0255   LABSPEC 1.013 12/27/2016 0255   PHURINE 8.0 12/27/2016 0255   GLUCOSEU NEGATIVE 12/27/2016 0255   HGBUR NEGATIVE 12/27/2016 0255   BILIRUBINUR NEGATIVE 12/27/2016 0255   KETONESUR NEGATIVE 12/27/2016 0255   PROTEINUR 30 (A) 12/27/2016 0255   UROBILINOGEN 0.2 03/29/2015 0650   NITRITE NEGATIVE 12/27/2016 0255   LEUKOCYTESUR NEGATIVE 12/27/2016 0255     Radiological Exams on Admission: Dg Chest 2 View  Result Date: 06/22/2017 CLINICAL DATA:  Acute onset of cough and shortness of breath. EXAM: CHEST  2 VIEW COMPARISON:  Chest  radiograph performed 01/29/2017 FINDINGS: The lungs are well-aerated. Vascular congestion is noted. Increased interstitial markings raise concern for pulmonary edema. There is no evidence of pleural effusion or pneumothorax. The heart is mildly enlarged. No acute osseous abnormalities are seen. Diffuse calcification is seen along the abdominal aorta. IMPRESSION: Vascular congestion and mild cardiomegaly. Increased interstitial markings raise concern for pulmonary edema. Electronically Signed   By: Roanna Raider M.D.   On: 06/22/2017 06:38    EKG:  Normal axis, regular rate and rhythm; no acute ischemic changes appreciated.  Assessment/Plan 1-acute on chronic diastolic CHF (congestive heart failure) (HCC) -Patient with increased lower extremity edema, positive crackles on exam, vascular congestion and interstitial edema on her chest x-ray and elevated BNP. -Admitted to telemetry bed -Follow daily weights, low-sodium diet and a strict intake and output -No beta-blocker secondary to bradycardia and no ACE inhibitors in the setting of acute exacerbation with soft blood pressure. -Started on IV Lasix -Cycle troponin and check 2D echo -TED hose ordered -PRN oxygen supplementation -continue ASA -follow BMET daily  2-COPD exacerbation and bronchiectasis  -Patient with underlying COPD has been experiencing upper respiratory infection symptoms for over 4 days now. -Increased expiratory wheezing, tachypnea and productive cough. -Will start DuoNeb, Pulmicort twice a day, Solu-Medrol and doxycycline -Oxygen supplementation as needed -Flutter valve and Mucinex -Influenza by PCR negative.  3-GERD (gastroesophageal reflux disease) -Continue PPI  4-Stroke [facility]): Prior history of stroke without significant residual deficit. -Will continue aspirin and Plavix for secondary prevention -No new focal deficits/weakness currently.  5-essential hypertension, benign -will continue cardizem, but dose  adjusted given soft BP on admission and need of room for diuresis -follow heart healthy diet.  6-depression -continue Zoloft  7-chronic pain in her hip after hip surgery   -continue PRN analgesics -continue bowel regimen   Time: 70 minutes.   DVT prophylaxis: Heparin Code Status: DNR Family Communication: Son at bedside Disposition Plan: To be determined.  But anticipating discharge back to assisted living facility once medically stable. Consults called: None Admission status: Telemetry bed, LOS more than 2 midnights; inpatient status.   Vassie Loll MD [facility] Pager [phone]  If 7PM-7AM, please contact night-coverage [email or web address] Password [email or web address]  06/22/2017, 9:05 AM

## 2017-06-22 NOTE — ED Provider Notes (Signed)
[facility] EMERGENCY DEPARTMENT Provider Note   CSN: [id number] Arrival date & time: 06/22/17  0455     History   Chief Complaint Chief Complaint  Patient presents with  . Cough    HPI Debbie Webb is a 84 y.o. female.  Patient from assisted living this morning with difficulty breathing, coughing and wheezing.  Patient reports that she is been sick for about a week and had acute difficulty breathing last night when trying to sleep.  She was given cough syrup throughout the night by nursing home staff.  Called her son stating that she could not breathe.  She does have a history of COPD but does not take any regular inhalers.  She denies any chest pain or fever.  Shortness of breath is worse when she tries to lay down.  She normally sleeps somewhat elevated.  Does not have any history of heart failure.  Good p.o. intake and urine output.  No fever.  Similar presentation to the hospital last fall.   The history is provided by the patient and a relative.  Cough  Associated symptoms include shortness of breath and wheezing. Pertinent negatives include no chest pain, no headaches, no rhinorrhea and no myalgias.    Past Medical History:  Diagnosis Date  . Carotid artery disease (HCC)    Status post carotid endarterectomy x2  . Essential hypertension, benign   . Hemiparesis affecting left side as late effect of cerebrovascular accident (HCC) 08/03/2011  . Stroke (HCC) 1997  . Tremor     Patient Active Problem List   Diagnosis Date Noted  . COPD with acute bronchitis (HCC) 01/29/2017  . S/P left hip hemiarthroplasty 01/01/2017  . Left displaced femoral neck fracture (HCC) 12/28/2016  . HLD (hyperlipidemia) 12/27/2016  . Fracture of femoral neck, left, closed (HCC) 12/27/2016  . Fx humeral neck, left, closed, initial encounter 12/27/2016  . Depression with anxiety 12/27/2016  . Essential hypertension, benign   . Aortic stenosis 09/12/2012  . GERD (gastroesophageal reflux  disease) 01/26/2012  . Atypical chest pain 01/25/2012  . Carotid artery disease (HCC)   . Ankle sprain 08/23/2011  . Hypothermia 08/03/2011  . Fall 08/03/2011  . Hypokalemia 08/03/2011  . Leukocytosis 08/03/2011  . Azotemia 08/03/2011  . Left hip pain 08/03/2011  . Personal history of stroke with residual effects 08/03/2011  . Hemiparesis affecting left side as late effect of cerebrovascular accident (HCC) 08/03/2011  . Bradycardia 08/03/2011  . Hand ulceration (HCC) 08/03/2011  . Elevated LFTs 08/03/2011  . Elevated troponin I level 08/03/2011  . Rhabdomyolysis 08/03/2011  . Stroke [facility](HCC) 05/15/1995    Past Surgical History:  Procedure Laterality Date  . ANTERIOR APPROACH HEMI HIP ARTHROPLASTY Left 12/28/2016   Procedure: ANTERIOR APPROACH HEMI HIP ARTHROPLASTY;  Surgeon: Samson FredericSwinteck, Brian, MD;  Location: [facility] OR;  Service: Orthopedics;  Laterality: Left;  . CAROTID ENDARTERECTOMY     Right    OB History    No data available       Home Medications    Prior to Admission medications   Medication Sig Start Date End Date Taking? Authorizing Provider  albuterol (PROVENTIL) 2 MG tablet Take 2 mg by mouth 2 (two) times daily.    [provider]  aspirin 81 MG chewable tablet Chew 81 mg by mouth daily.    [provider]  ciprofloxacin (CIPRO) 500 MG tablet Take 500 mg 2 (two) times daily by mouth.    [provider]  clopidogrel (PLAVIX) 75 MG  tablet Take 75 mg by mouth daily.    [provider]  diltiazem (CARDIZEM) 30 MG tablet Take 30 mg by mouth 3 (three) times daily before meals. For swallowing before meals     [provider]  docusate sodium (COLACE) 100 MG capsule Take 1 capsule (100 mg total) by mouth 2 (two) times daily. 12/31/16   Glade Lloyd, MD  doxycycline (VIBRA-TABS) 100 MG tablet Take 100 mg by mouth 2 (two) times daily. For 7 days    [provider]  ENSURE (ENSURE) Take 237 mLs by mouth 2 (two) times daily  between meals.    [provider]  guaiFENesin-dextromethorphan (ROBITUSSIN DM) 100-10 MG/5ML syrup Take 5 mLs by mouth every 4 (four) hours as needed for cough. 01/31/17   Dhungel, Theda Belfast, MD  meprobamate (EQUANIL) 200 MG tablet Take 200 mg 3 (three) times daily by mouth.    [provider]  methocarbamol (ROBAXIN) 500 MG tablet Take 1 tablet (500 mg total) by mouth every 8 (eight) hours as needed for muscle spasms. 12/31/16   Glade Lloyd, MD  Multiple Vitamins-Minerals (CENTRUM SILVER PO) Take 1 tablet by mouth daily.     [provider]  oxybutynin (DITROPAN) 5 MG tablet Take 5 mg by mouth every 8 (eight) hours as needed for bladder spasms.    [provider]  oxyCODONE-acetaminophen (PERCOCET/ROXICET) 5-325 MG tablet Take 1 tablet by mouth every 4 (four) hours as needed for moderate pain. 12/31/16   Glade Lloyd, MD  polyethylene glycol (MIRALAX / GLYCOLAX) packet Take 17 g by mouth daily as needed for mild constipation. 12/31/16   Glade Lloyd, MD  senna (SENOKOT) 8.6 MG TABS tablet Take 1 tablet (8.6 mg total) by mouth 2 (two) times daily. 12/31/16   Glade Lloyd, MD  sertraline (ZOLOFT) 50 MG tablet Take 50 mg by mouth daily.    [provider]  terbinafine (LAMISIL) 250 MG tablet Take 250 mg by mouth daily. Patient is on for 12 weeks    [provider]  vitamin B-12 (CYANOCOBALAMIN) 100 MCG tablet Take 100 mcg by mouth daily.    [provider]    Family History Family History  Problem Relation Age of Onset  . Colon cancer Mother   . Heart disease Mother   . Bladder Cancer Brother   . Cirrhosis Brother        Liver cirrhosis    Social History Social History   Tobacco Use  . Smoking status: Never Smoker  . Smokeless tobacco: Never Used  Substance Use Topics  . Alcohol use: No  . Drug use: No     Allergies   No known allergies   Review of Systems Review of Systems  Constitutional: Negative for activity  change, appetite change and fever.  HENT: Negative for congestion and rhinorrhea.   Respiratory: Positive for cough, shortness of breath and wheezing.   Cardiovascular: Negative for chest pain.  Gastrointestinal: Negative for abdominal pain, nausea and vomiting.  Genitourinary: Negative for dysuria, hematuria, vaginal bleeding and vaginal discharge.  Musculoskeletal: Negative for arthralgias and myalgias.  Skin: Negative for rash.  Neurological: Negative for dizziness, weakness and headaches.    all other systems are negative except as noted in the HPI and PMH.    Physical Exam Updated Vital Signs BP (!) 164/44 (BP Location: Right Arm)   Pulse 70   Temp 98 F (36.7 C) (Oral)   Resp (!) 32   Ht 5' (1.524 m)   Wt  53.5 kg (118 lb)   SpO2 95%   BMI 23.05 kg/m   Physical Exam  Constitutional: She is oriented to person, place, and time. She appears well-developed and well-nourished. She appears distressed.  Mild respiratory distress Distant with conversation with auditory wheezing  HENT:  Head: Normocephalic and atraumatic.  Mouth/Throat: Oropharynx is clear and moist. No oropharyngeal exudate.  Eyes: Conjunctivae and EOM are normal. Pupils are equal, round, and reactive to light.  Neck: Normal range of motion. Neck supple.  No meningismus. Coarse upper airway wheezing  Cardiovascular: Normal rate, regular rhythm, normal heart sounds and intact distal pulses.  No murmur heard. Pulmonary/Chest: She is in respiratory distress. She has wheezes.  Coarse rhonchi throughout with expiratory wheezing  Abdominal: Soft. There is no tenderness. There is no rebound and no guarding.  Musculoskeletal: Normal range of motion. She exhibits edema. She exhibits no tenderness.  +1 edema to knees  Neurological: She is alert and oriented to person, place, and time. No cranial nerve deficit. She exhibits normal muscle tone. Coordination normal.  No ataxia on finger to nose bilaterally. No pronator  drift. 5/5 strength throughout. CN 2-12 intact.Equal grip strength. Sensation intact.   Skin: Skin is warm.  Psychiatric: She has a normal mood and affect. Her behavior is normal.  Nursing note and vitals reviewed.    ED Treatments / Results  Labs (all labs ordered are listed, but only abnormal results are displayed) Labs Reviewed  CBC WITH DIFFERENTIAL/PLATELET - Abnormal; Notable for the following components:      Result Value   Hemoglobin 11.6 (*)    All other components within normal limits  BASIC METABOLIC PANEL - Abnormal; Notable for the following components:   GFR calc non Af Amer 56 (*)    All other components within normal limits  BRAIN NATRIURETIC PEPTIDE - Abnormal; Notable for the following components:   B Natriuretic Peptide 260.0 (*)    All other components within normal limits  TROPONIN I - Abnormal; Notable for the following components:   Troponin I 0.04 (*)    All other components within normal limits  INFLUENZA PANEL BY PCR (TYPE A & B)    EKG  EKG Interpretation  Date/Time:  Saturday June 22 2017 05:17:10 EST Ventricular Rate:  85 PR Interval:    QRS Duration: 92 QT Interval:  388 QTC Calculation: 378 R Axis:   83 Text Interpretation:  Sinus rhythm Ventricular bigeminy Borderline right axis deviation Nonspecific T abnormalities, lateral leads wandering baseline Interpretation limited secondary to artifact Confirmed by Glynn Octave [phone]) on 06/22/2017 5:59:41 AM       Radiology Dg Chest 2 View  Result Date: 06/22/2017 CLINICAL DATA:  Acute onset of cough and shortness of breath. EXAM: CHEST  2 VIEW COMPARISON:  Chest radiograph performed 01/29/2017 FINDINGS: The lungs are well-aerated. Vascular congestion is noted. Increased interstitial markings raise concern for pulmonary edema. There is no evidence of pleural effusion or pneumothorax. The heart is mildly enlarged. No acute osseous abnormalities are seen. Diffuse calcification is seen along the  abdominal aorta. IMPRESSION: Vascular congestion and mild cardiomegaly. Increased interstitial markings raise concern for pulmonary edema. Electronically Signed   By: Roanna Raider M.D.   On: 06/22/2017 06:38    Procedures Procedures (including critical care time)  Medications Ordered in ED Medications  albuterol (PROVENTIL) (2.5 MG/3ML) 0.083% nebulizer solution 5 mg (not administered)  ipratropium-albuterol (DUONEB) 0.5-2.5 (3) MG/3ML nebulizer solution 3 mL (not administered)  methylPREDNISolone sodium succinate (SOLU-MEDROL) 125 mg/2  mL injection 80 mg (not administered)     Initial Impression / Assessment and Plan / ED Course  I have reviewed the triage vital signs and the nursing notes.  Pertinent labs & imaging results that were available during my care of the patient were reviewed by me and considered in my medical decision making (see chart for details).    Patient with cough, shortness of breath and wheezing.  She is tachypneic and wheezing on exam.  She is given nebulizers and steroids  Chest x-ray does show interstitial edema.  IV Lasix given.  Suspect combination of COPD as well as CHF causing patient's tachypnea and respiratory distress.  Will continue nebulizers, steroids and antibiotics and IV Lasix.  Patient continues with increased work of breathing and tachypnea. Admission discussed with Dr. Gwenlyn Perking. Final Clinical Impressions(s) / ED Diagnoses   Final diagnoses:  COPD exacerbation (HCC)  Acute pulmonary edema [facility])    ED Discharge Orders    None       Glynn Octave, MD 06/22/17 0930

## 2017-06-23 ENCOUNTER — Inpatient Hospital Stay (HOSPITAL_COMMUNITY): Payer: Medicare Other

## 2017-06-23 DIAGNOSIS — I340 Nonrheumatic mitral (valve) insufficiency: Secondary | ICD-10-CM

## 2017-06-23 DIAGNOSIS — J441 Chronic obstructive pulmonary disease with (acute) exacerbation: Secondary | ICD-10-CM

## 2017-06-23 LAB — BASIC METABOLIC PANEL
ANION GAP: 10 (ref 5–15)
BUN: 27 mg/dL — ABNORMAL HIGH (ref 6–20)
CHLORIDE: 101 mmol/L (ref 101–111)
CO2: 29 mmol/L (ref 22–32)
Calcium: 9.5 mg/dL (ref 8.9–10.3)
Creatinine, Ser: 1.03 mg/dL — ABNORMAL HIGH (ref 0.44–1.00)
GFR calc non Af Amer: 45 mL/min — ABNORMAL LOW (ref >60)
GFR, EST AFRICAN AMERICAN: 52 mL/min — AB (ref >60)
GLUCOSE: 138 mg/dL — AB (ref 65–99)
POTASSIUM: 4 mmol/L (ref 3.5–5.1)
Sodium: 140 mmol/L (ref 135–145)

## 2017-06-23 LAB — ECHOCARDIOGRAM COMPLETE
Height: 65 in
Weight: 1887.14 oz

## 2017-06-23 NOTE — Progress Notes (Signed)
[facility] PROGRESS NOTE  Debbie Webb EPP:[id number] DOB: [birth year] DOA: 06/22/2017 PCP: [name], MD  Interim summary and HPI 84 year old female with a past medical history significant for hypertension, diastolic heart failure, gastroesophageal reflux disease, COPD and prior stroke (with mild to moderate left side hemiparesis as residual deficit); who was brought to the emergency department by her son secondary to increased shortness of breath.  Patient reports that for the last 4 days she has noticed increase shortness of breath, difficulty breathing with activity, productive cough and just not feeling good.  Patient denies chest pain, palpitations, fever/chills, nausea, vomiting, abdominal pain, dysuria, melena or hematochezia. Patient has also noticed orthopnea and increased swelling in her lower extremities.  Assessment/Plan: 1-acute on chronic diastolic CHF (congestive heart failure) (HCC) -Continue IV Lasix and telemetry monitoring. -Continue daily weights, low-sodium diet strict intake and output. -No beta-blocker secondary to bradycardia and no ACE inhibitors in the setting of acute exacerbation with soft blood pressure. -Follow 2D echo results -Troponin remain in a flat elevation suggesting demand ischemia in the setting of acute heart failure exacerbation. -Continue TED hose 12 hours on and 12 hours off. -Continue aspirin -Continue weaning oxygen supplementation as tolerated.    2-COPD exacerbation and bronchiectasis  -Patient with underlying COPD has been experiencing upper respiratory infection symptoms for over 4 days now. -No frank wheezing appreciated on exam, patient with improved air movement; still uses on oxygen supplementation. -Continue DuoNeb, Pulmicort, steroids and doxycycline.  -Continue weaning oxygen supplementation as tolerated -Continue flutter valve and Mucinex -Influenza by PCR negative.  3-GERD (gastroesophageal reflux disease) -Continue  PPI  4-Stroke [facility]): Prior history of stroke without significant residual deficit. -Overall stable -Continue aspirin and Plavix for secondary prevention -No new focal deficits/weakness currently.  5-essential hypertension, benign -Continue Cardizem at current dose -Continue heart healthy diet -Follow vital signs and adjust antihypertensive regimen as needed. -Lasix also contributing to control her blood pressure.  6-depression -continue Zoloft -Mood stable  7-chronic pain in her hip after hip surgery   -continue PRN analgesics -continue bowel regimen   Code Status: DNR Family Communication: Son at bedside. Disposition Plan: Anticipate discharge back to assisted living facility in the next 24-48 hours.  Continue IV Lasix at least for another 24 hours, follow daily weights, strict intake and output and low-sodium diet.  Follow 2D echo results.   Consultants:  None  Procedures:  2D echo: Pending  Antibiotics:  None  HPI/Subjective: No fever, no chest pain, no palpitations, no nausea vomiting.  Patient reports improvement in her breathing.  Still requiring oxygen supplementation (2 L), mild JVD ; No frank crackles.  Objective: Vitals:   06/23/17 1254 06/23/17 1355  BP:  (!) 123/42  Pulse:  92  Resp:  20  Temp:  98.4 F (36.9 C)  SpO2: 98% 94%    Intake/Output Summary (Last 24 hours) at 06/23/2017 1823 Last data filed at 06/23/2017 6063 Gross per 24 hour  Intake -  Output 700 ml  Net -700 ml   Filed Weights   06/22/17 0515 06/22/17 1026  Weight: 53.5 kg (118 lb) 53.5 kg (117 lb 15.1 oz)    Exam:   General: Feeling a lot better, denies chest pain, no nausea vomiting.  Still using oxygen supplementation 2 L per express significant improvement in her breathing.  Cardiovascular: Rate controlled, No rubs, no gallop.  Positive mild JVD on exam.  Respiratory: Decreased breath sounds at the bases, no wheezing, no frank crackles, no using accessory muscles.  Positive scattered rhonchi.  Abdomen: Soft, nontender, nondistended, positive bowel sounds  Musculoskeletal: Trace edema bilaterally, no cyanosis, no clubbing  Skin: Patient with a stage I in her sacrum; no superimposed infection, no drainage.  No other open wounds or induration.  Patient pressure injury was present prior to admission.    Data Reviewed: Basic Metabolic Panel: Recent Labs  Lab 06/22/17 0523 06/22/17 0919 06/23/17 0634  NA 140  --  140  K 4.2  --  4.0  CL 106  --  101  CO2 24  --  29  GLUCOSE 93  --  138*  BUN 19  --  27*  CREATININE 0.86  --  1.03*  CALCIUM 9.5  --  9.5  MG  --  1.8  --    CBC: Recent Labs  Lab 06/22/17 0523  WBC 5.8  NEUTROABS 4.1  HGB 11.6*  HCT 36.4  MCV 94.1  PLT 150   Cardiac Enzymes: Recent Labs  Lab 06/22/17 0523 06/22/17 0919 06/22/17 1436 06/22/17 2021  TROPONINI 0.04* 0.04* 0.03* 0.03*   BNP (last 3 results) Recent Labs    12/27/16 0301 06/22/17 0523  BNP 101.0* 260.0*    CBG: Recent Labs  Lab 06/22/17 2030  GLUCAP 184*    Recent Results (from the past 240 hour(s))  MRSA PCR Screening     Status: None   Collection Time: 06/22/17 11:04 AM  Result Value Ref Range Status   MRSA by PCR NEGATIVE NEGATIVE Final    Comment:        The GeneXpert MRSA Assay (FDA approved for NASAL specimens only), is one component of a comprehensive MRSA colonization surveillance program. It is not intended to diagnose MRSA infection nor to guide or monitor treatment for MRSA infections. Performed at [facility], [address], [place], [place] [address]      Studies: Dg Chest 2 View  Result Date: 06/22/2017 CLINICAL DATA:  Acute onset of cough and shortness of breath. EXAM: CHEST  2 VIEW COMPARISON:  Chest radiograph performed 01/29/2017 FINDINGS: The lungs are well-aerated. Vascular congestion is noted. Increased interstitial markings raise concern for pulmonary edema. There is no evidence of pleural effusion or  pneumothorax. The heart is mildly enlarged. No acute osseous abnormalities are seen. Diffuse calcification is seen along the abdominal aorta. IMPRESSION: Vascular congestion and mild cardiomegaly. Increased interstitial markings raise concern for pulmonary edema. Electronically Signed   By: Roanna Raider M.D.   On: 06/22/2017 06:38    Scheduled Meds: . aspirin  81 mg Oral Daily  . budesonide (PULMICORT) nebulizer solution  0.25 mg Nebulization BID  . clopidogrel  75 mg Oral Daily  . dextromethorphan-guaiFENesin  1 tablet Oral BID  . diltiazem  30 mg Oral Q12H  . docusate sodium  100 mg Oral BID  . doxycycline  100 mg Oral Q12H  . feeding supplement (ENSURE ENLIVE)  237 mL Oral BID BM  . furosemide  20 mg Intravenous Q12H  . heparin  5,000 Units Subcutaneous Q8H  . ipratropium-albuterol  3 mL Nebulization QID  . meprobamate  200 mg Oral TID  . methylPREDNISolone (SOLU-MEDROL) injection  40 mg Intravenous Q8H  . potassium chloride  20 mEq Oral Daily  . sertraline  50 mg Oral Daily  . sodium chloride flush  3 mL Intravenous Q12H  . vitamin B-12  100 mcg Oral Daily   Continuous Infusions: . sodium chloride      Principal Problem:   Acute on chronic diastolic CHF (  congestive heart failure) (HCC) Active Problems:   GERD (gastroesophageal reflux disease)   Stroke [facility])   Essential hypertension, benign   HLD (hyperlipidemia)   COPD with acute bronchitis (HCC)    Time spent: 25 minutes    Vassie Loll  [facility] Pager [phone]. If 7PM-7AM, please contact night-coverage at [email or web address], password [facility] 06/23/2017, 6:23 PM  LOS: 1 day

## 2017-06-23 NOTE — Progress Notes (Signed)
Echocardiogram 2D Echocardiogram has been performed.  Pieter PartridgeBrooke S Trea Carnegie 06/23/2017, 9:23 AM

## 2017-06-24 DIAGNOSIS — L89151 Pressure ulcer of sacral region, stage 1: Secondary | ICD-10-CM

## 2017-06-24 DIAGNOSIS — Z23 Encounter for immunization: Secondary | ICD-10-CM | POA: Diagnosis not present

## 2017-06-24 DIAGNOSIS — L8990 Pressure ulcer of unspecified site, unspecified stage: Secondary | ICD-10-CM

## 2017-06-24 LAB — BASIC METABOLIC PANEL
ANION GAP: 9 (ref 5–15)
BUN: 38 mg/dL — ABNORMAL HIGH (ref 6–20)
CALCIUM: 9.9 mg/dL (ref 8.9–10.3)
CO2: 32 mmol/L (ref 22–32)
Chloride: 99 mmol/L — ABNORMAL LOW (ref 101–111)
Creatinine, Ser: 1.17 mg/dL — ABNORMAL HIGH (ref 0.44–1.00)
GFR, EST AFRICAN AMERICAN: 45 mL/min — AB (ref >60)
GFR, EST NON AFRICAN AMERICAN: 39 mL/min — AB (ref >60)
GLUCOSE: 126 mg/dL — AB (ref 65–99)
Potassium: 4.1 mmol/L (ref 3.5–5.1)
SODIUM: 140 mmol/L (ref 135–145)

## 2017-06-24 MED ORDER — CYANOCOBALAMIN 1000 MCG PO TABS: 1000.0000 ug | ORAL_TABLET | Freq: Every day | ORAL | 1 refills | Status: AC

## 2017-06-24 MED ORDER — PREDNISONE 20 MG PO TABS: ORAL_TABLET | ORAL | 0 refills | Status: AC

## 2017-06-24 MED ORDER — DOXYCYCLINE HYCLATE 100 MG PO TABS: 100.0000 mg | ORAL_TABLET | Freq: Two times a day (BID) | ORAL | 0 refills | Status: AC

## 2017-06-24 MED ORDER — MEPROBAMATE 200 MG PO TABS: 200.0000 mg | ORAL_TABLET | Freq: Three times a day (TID) | ORAL | 0 refills | Status: AC

## 2017-06-24 MED ORDER — IPRATROPIUM-ALBUTEROL 20-100 MCG/ACT IN AERS: 1.0000 | INHALATION_SPRAY | Freq: Four times a day (QID) | RESPIRATORY_TRACT | 1 refills | Status: AC | PRN

## 2017-06-24 MED ORDER — FUROSEMIDE 40 MG PO TABS: 40.0000 mg | ORAL_TABLET | Freq: Every day | ORAL | 1 refills | Status: AC

## 2017-06-24 MED ORDER — BENZONATATE 100 MG PO CAPS: 100.0000 mg | ORAL_CAPSULE | Freq: Three times a day (TID) | ORAL | 0 refills | Status: AC | PRN

## 2017-06-24 NOTE — Evaluation (Signed)
Physical Therapy Evaluation Patient Details Name: Debbie Musselreva T Fiscus MRN: [id number] DOB: 06/03/1920 Today's Date: 06/24/2017   History of Present Illness  Debbie Webb is a 84 year old female with a past medical history significant for hypertension, diastolic heart failure, gastroesophageal reflux disease, COPD and prior stroke (with mild to moderate left side hemiparesis as residual deficit); who was brought to the emergency department by her son secondary to increased shortness of breath.  Patient reports that for the last 4 days she has noticed increase shortness of breath, difficulty breathing with activity, productive cough and just not feeling good.  Patient denies chest pain, palpitations, fever/chills, nausea, vomiting, abdominal pain, dysuria, melena or hematochezia.    Clinical Impression  Patient able to ambulate in hallway on room air with O2 saturation at 95%, required Min guard assist for functional activity and walking, no loss of balance and tolerated sitting up in chair with her son in room.  Patient will benefit from continued physical therapy in hospital and recommended venue below to increase strength, balance, endurance for safe ADLs and gait.    Follow Up Recommendations Home health PT;Supervision for mobility/OOB    Equipment Recommendations  None recommended by PT    Recommendations for Other Services       Precautions / Restrictions Precautions Precautions: Fall Restrictions Weight Bearing Restrictions: No      Mobility  Bed Mobility Overal bed mobility: Needs Assistance Bed Mobility: Supine to Sit     Supine to sit: Min guard     General bed mobility comments: slow labored movement  Transfers Overall transfer level: Needs assistance Equipment used: Rolling walker (2 wheeled) Transfers: Sit to/from [employer] CorporationStand;Stand Pivot Transfers Sit to Stand: Min guard Stand pivot transfers: Min guard          Ambulation/Gait Ambulation/Gait assistance: Min  guard Ambulation Distance (Feet): 35 Feet Assistive device: Rolling walker (2 wheeled) Gait Pattern/deviations: Decreased step length - right;Decreased step length - left;Decreased stride length   Gait velocity interpretation: Below normal speed for age/gender General Gait Details: demonstrates slow labored cadence without loss of balance, limited secondary to fatigue, on room air with O2 sats at 95%  Stairs            Wheelchair Mobility    Modified Rankin (Stroke Patients Only)       Balance Overall balance assessment: Needs assistance Sitting-balance support: Feet supported;No upper extremity supported Sitting balance-Leahy Scale: Good     Standing balance support: Bilateral upper extremity supported;During functional activity Standing balance-Leahy Scale: Fair                               Pertinent Vitals/Pain Pain Assessment: No/denies pain    Home Living Family/patient expects to be discharged to:: Assisted living               Home Equipment: Grab bars - toilet;Walker - 4 wheels;Wheelchair - manual      Prior Function Level of Independence: Needs assistance   Gait / Transfers Assistance Needed: Supervised household gait with Rollator  ADL's / Homemaking Assistance Needed: assited by ALF staff        Hand Dominance   Dominant Hand: Right    Extremity/Trunk Assessment   Upper Extremity Assessment Upper Extremity Assessment: Overall WFL for tasks assessed;LUE deficits/detail LUE Deficits / Details: LUE grossly 3+/5 (baseline since CVA)    Lower Extremity Assessment Lower Extremity Assessment: Overall WFL for tasks assessed;LLE deficits/detail LLE Deficits /  Details: grossly -4/5 (baseline since CVA)    Cervical / Trunk Assessment Cervical / Trunk Assessment: Normal  Communication   Communication: HOH  Cognition Arousal/Alertness: Awake/alert Behavior During Therapy: WFL for tasks assessed/performed Overall Cognitive  Status: Within Functional Limits for tasks assessed                                        General Comments      Exercises     Assessment/Plan    PT Assessment Patient needs continued PT services  PT Problem List Decreased strength;Decreased activity tolerance;Decreased balance;Decreased mobility       PT Treatment Interventions Gait training;Functional mobility training;Therapeutic activities;Therapeutic exercise;Patient/family education    PT Goals (Current goals can be found in the Care Plan section)  Acute Rehab PT Goals Patient Stated Goal: return to ALF PT Goal Formulation: With patient/family Time For Goal Achievement: 06/28/17 Potential to Achieve Goals: Good    Frequency Min 3X/week   Barriers to discharge        Co-evaluation               AM-PAC PT "6 Clicks" Daily Activity  Outcome Measure Difficulty turning over in bed (including adjusting bedclothes, sheets and blankets)?: None Difficulty moving from lying on back to sitting on the side of the bed? : A Little Difficulty sitting down on and standing up from a chair with arms (e.g., wheelchair, bedside commode, etc,.)?: A Little Help needed moving to and from a bed to chair (including a wheelchair)?: A Little Help needed walking in hospital room?: A Little Help needed climbing 3-5 steps with a railing? : A Lot 6 Click Score: 18    End of Session   Activity Tolerance: Patient tolerated treatment well;Patient limited by fatigue Patient left: in chair;with call bell/phone within reach;with family/visitor present Nurse Communication: Mobility status PT Visit Diagnosis: Unsteadiness on feet (R26.81);Other abnormalities of gait and mobility (R26.89);Muscle weakness (generalized) (M62.81)    Time: 1030-1055 PT Time Calculation (min) (ACUTE ONLY): 25 min   Charges:   PT Evaluation $PT Eval Moderate Complexity: 1 Mod PT Treatments $Therapeutic Activity: 23-37 mins   PT G Codes:         11:36 AM, 2017-06-29 Ocie Bob, MPT Physical Therapist with [facility] [phone] office [phone] mobile phone

## 2017-06-24 NOTE — Care Management Note (Signed)
Case Management Note  Patient Details  Name: Gershon Musselreva T Griffith MRN: [id number] Date of Birth: 02/01/1921  Subjective/Objective:      Admitted with CHF. [facility] in [place]. Pt's son at bedside. Pt mostly ind with ADL's. She uses RW with ambulation. She is no longer requiring supplemental oxygen. PT recommends HH PT, pt agreeable. Facility [facility], pt agreeable. Aware HH has 48 hrs to make fist visit.                Action/Plan: DC back to ALF today. CSW has made arrangements for return to facility. CM has called referral to Crist InfanteSarah Napier, [facility] rep who will pull pt info from Epic.   Expected Discharge Date:  06/25/17               Expected Discharge Plan:  Assisted Living / Rest Home(with HH services)  In-House Referral:  Clinical Social Work  Discharge planning Services  CM Consult  Post Acute Care Choice:  Home Health Choice offered to:  Patient  HH Arranged:  PT HH Agency:  [facility]  Status of Service:  Completed, signed off  Malcolm MetroChildress, Dorsey Authement Demske, RN 06/24/2017, 1:45 PM

## 2017-06-24 NOTE — Care Management Important Message (Signed)
Important Message  Patient Details  Name: Debbie Webb MRN: [id number] Date of Birth: 01/11/1921   Medicare Important Message Given:  Yes    Malcolm MetroChildress, Maycel Riffe Demske, RN 06/24/2017, 1:44 PM

## 2017-06-24 NOTE — Clinical Social Work Note (Signed)
Clinical Social Work Assessment  Patient Details  Name: Debbie Webb T Minasyan MRN: [id number] Date of Birth: 08/03/1920  Date of referral:  06/24/17               Reason for consult:  Facility Placement                Permission sought to share information with:    Permission granted to share information::     Name::        Agency::  [facility].  Relationship::     Contact Information:     Housing/Transportation Living arrangements for the past 2 months:  Assisted Living Facility Source of Information:  Facility, Patient Patient Interpreter Needed:  None Criminal Activity/Legal Involvement Pertinent to Current Situation/Hospitalization:  No - Comment as needed Significant Relationships:  None Lives with:  Facility Resident Do you feel safe going back to the place where you live?  Yes Need for family participation in patient care:  Yes (Comment)  Care giving concerns:  None identified at baseline. Facility resident.    Social Worker assessment / plan:  [facility] [place]. Patient is continent, uses a walker with escort and is independent in eating and toileting. Patient requires assistance with ambulation, showers and dressing. She has a supportive family. LCSW advised Marcelino DusterMichelle that patient would discharge today. Patient was agreeable to return to the facility.  Employment status:  Retired Database administratornsurance information:  Managed Medicare PT Recommendations:  Home with Home Health Information / Referral to community resources:     Patient/Family's Response to care:  Patient is agreeable to return.   Patient/Family's Understanding of and Emotional Response to Diagnosis, Current Treatment, and Prognosis:  Patient and family understand patient's diagnosis, treatment and prognosis.   Emotional Assessment Appearance:  Appears stated age Attitude/Demeanor/Rapport:    Affect (typically observed):  Accepting Orientation:  Oriented to Self, Oriented to  Place, Oriented to  Time, Oriented to Situation Alcohol / Substance use:  Not Applicable Psych involvement (Current and /or in the community):  No (Comment)  Discharge Needs  Concerns to be addressed:  No discharge needs identified Readmission within the last 30 days:  No Current discharge risk:  None Barriers to Discharge:  No Barriers Identified   Annice NeedySettle, Barbie Croston D, LCSW 06/24/2017, 12:34 PM

## 2017-06-24 NOTE — Plan of Care (Signed)
  Acute Rehab PT Goals(only PT should resolve) Pt Will Go Supine/Side To Sit 06/24/2017 1138 - Progressing by Ocie BobWatkins, Toshi Ishii, PT Flowsheets Taken 06/24/2017 1138  Pt will go Supine/Side to Sit with supervision Patient Will Transfer Sit To/From Stand 06/24/2017 1138 - Progressing by Ocie BobWatkins, Kanaya Gunnarson, PT Flowsheets Taken 06/24/2017 1138  Patient will transfer sit to/from stand with supervision Pt Will Transfer Bed To Chair/Chair To Bed 06/24/2017 1138 - Progressing by Ocie BobWatkins, Jhada Risk, PT Flowsheets Taken 06/24/2017 1138  Pt will Transfer Bed to Chair/Chair to Bed with supervision Pt Will Ambulate 06/24/2017 1138 - Progressing by Ocie BobWatkins, Helem Reesor, PT Flowsheets Taken 06/24/2017 1138  Pt will Ambulate with supervision;50 feet;with rolling walker  11:39 AM, 06/24/17 Ocie BobJames Kairos Panetta, MPT Physical Therapist with [facility] [phone] office [phone] mobile phone

## 2017-06-24 NOTE — Progress Notes (Signed)
Patient's son states understanding of discharge instructions, [facility] called and informed of patient's return

## 2017-06-24 NOTE — [place] FL2 (Signed)
[place] MEDICAID FL2 LEVEL OF CARE SCREENING TOOL     IDENTIFICATION  Patient Name: Debbie Webb Birthdate: 03-Jan-1921 Sex: female Admission Date (Current Location): 06/22/2017  [facility] and [place] Number:  [employer] and Address:  [facility],  [address], [place] [address]      Provider Number: [id number]  Attending Physician Name and Address:  Vassie Loll, MD  Relative Name and Phone Number:       Current Level of Care: Hospital Recommended Level of Care: Assisted Living Facility Prior Approval Number:    Date Approved/Denied:   PASRR Number:    Discharge Plan: Other (Comment)([facility])    Current Diagnoses: Patient Active Problem List   Diagnosis Date Noted  . Pressure injury of skin 06/24/2017  . Acute on chronic diastolic CHF (congestive heart failure) (HCC) 06/22/2017  . COPD with acute bronchitis (HCC) 01/29/2017  . S/P left hip hemiarthroplasty 01/01/2017  . Left displaced femoral neck fracture (HCC) 12/28/2016  . HLD (hyperlipidemia) 12/27/2016  . Fracture of femoral neck, left, closed (HCC) 12/27/2016  . Fx humeral neck, left, closed, initial encounter 12/27/2016  . Depression with anxiety 12/27/2016  . Essential hypertension, benign   . Aortic stenosis 09/12/2012  . GERD (gastroesophageal reflux disease) 01/26/2012  . Atypical chest pain 01/25/2012  . Carotid artery disease (HCC)   . Ankle sprain 08/23/2011  . Hypothermia 08/03/2011  . Fall 08/03/2011  . Hypokalemia 08/03/2011  . Leukocytosis 08/03/2011  . Azotemia 08/03/2011  . Left hip pain 08/03/2011  . Personal history of stroke with residual effects 08/03/2011  . Hemiparesis affecting left side as late effect of cerebrovascular accident (HCC) 08/03/2011  . Bradycardia 08/03/2011  . Hand ulceration (HCC) 08/03/2011  . Elevated LFTs 08/03/2011  . Elevated troponin I level 08/03/2011  . Rhabdomyolysis 08/03/2011  . Stroke (HCC) 05/15/1995     Orientation RESPIRATION BLADDER Height & Weight     Self, Time, Situation, Place  Normal Continent Weight: 117 lb 8.1 oz (53.3 kg) Height:  5\' 5"  (165.1 cm)  BEHAVIORAL SYMPTOMS/MOOD NEUROLOGICAL BOWEL NUTRITION STATUS      Continent Diet(Heart Healthy)  AMBULATORY STATUS COMMUNICATION OF NEEDS Skin   Limited Assist Verbally Normal                       Personal Care Assistance Level of Assistance  Bathing, Feeding, Dressing Bathing Assistance: Limited assistance Feeding assistance: Independent Dressing Assistance: Limited assistance     Functional Limitations Info  Sight, Hearing, Speech Sight Info: Adequate Hearing Info: Adequate Speech Info: Adequate    SPECIAL CARE FACTORS FREQUENCY  PT (By licensed PT)     PT Frequency: 3x/week              Contractures Contractures Info: Not present    Additional Factors Info  Psychotropic Code Status Info: DNR Allergies Info: NKA Psychotropic Info: Zoloft         Current Medications (06/24/2017):  This is the current hospital active medication list Current Facility-Administered Medications  Medication Dose Route Frequency Provider Last Rate Last Dose  . 0.9 %  sodium chloride infusion  250 mL Intravenous PRN Vassie Loll, MD      . acetaminophen (TYLENOL) tablet 650 mg  650 mg Oral Q4H PRN Vassie Loll, MD      . aspirin chewable tablet 81 mg  81 mg Oral Daily Vassie Loll, MD   81 mg at 06/24/17 2130  . budesonide (PULMICORT) nebulizer solution  0.25 mg  0.25 mg Nebulization BID Vassie Loll, MD   0.25 mg at 06/24/17 0757  . clopidogrel (PLAVIX) tablet 75 mg  75 mg Oral Daily Vassie Loll, MD   75 mg at 06/24/17 1610  . dextromethorphan-guaiFENesin (MUCINEX DM) 30-600 MG per 12 hr tablet 1 tablet  1 tablet Oral BID Vassie Loll, MD   1 tablet at 06/24/17 0831  . diltiazem (CARDIZEM) tablet 30 mg  30 mg Oral Q12H Vassie Loll, MD   30 mg at 06/24/17 0831  . docusate sodium (COLACE) capsule 100 mg   100 mg Oral BID Vassie Loll, MD   100 mg at 06/24/17 0831  . doxycycline (VIBRA-TABS) tablet 100 mg  100 mg Oral Q12H Vassie Loll, MD   100 mg at 06/24/17 0831  . feeding supplement (ENSURE ENLIVE) (ENSURE ENLIVE) liquid 237 mL  237 mL Oral BID BM Vassie Loll, MD   237 mL at 06/24/17 1418  . furosemide (LASIX) injection 20 mg  20 mg Intravenous Q12H Vassie Loll, MD   20 mg at 06/24/17 1205  . heparin injection 5,000 Units  5,000 Units Subcutaneous Q8H Vassie Loll, MD   5,000 Units at 06/24/17 1418  . ipratropium-albuterol (DUONEB) 0.5-2.5 (3) MG/3ML nebulizer solution 3 mL  3 mL Nebulization QID Vassie Loll, MD   3 mL at 06/24/17 1149  . meprobamate (EQUANIL) tablet 200 mg  200 mg Oral TID Vassie Loll, MD   200 mg at 06/24/17 9604  . methylPREDNISolone sodium succinate (SOLU-MEDROL) 40 mg/mL injection 40 mg  40 mg Intravenous Q8H Vassie Loll, MD   40 mg at 06/24/17 1418  . ondansetron (ZOFRAN) injection 4 mg  4 mg Intravenous Q6H PRN Vassie Loll, MD      . oxybutynin (DITROPAN) tablet 5 mg  5 mg Oral Q8H PRN Vassie Loll, MD      . oxyCODONE-acetaminophen (PERCOCET/ROXICET) 5-325 MG per tablet 1 tablet  1 tablet Oral Q6H PRN Vassie Loll, MD   1 tablet at 06/22/17 1801  . polyethylene glycol (MIRALAX / GLYCOLAX) packet 17 g  17 g Oral Daily PRN Vassie Loll, MD      . potassium chloride SA (K-DUR,KLOR-CON) CR tablet 20 mEq  20 mEq Oral Daily Vassie Loll, MD   20 mEq at 06/24/17 0831  . sertraline (ZOLOFT) tablet 50 mg  50 mg Oral Daily Vassie Loll, MD   50 mg at 06/24/17 5409  . sodium chloride flush (NS) 0.9 % injection 3 mL  3 mL Intravenous Q12H Vassie Loll, MD   3 mL at 06/24/17 8119  . sodium chloride flush (NS) 0.9 % injection 3 mL  3 mL Intravenous PRN Vassie Loll, MD      . vitamin B-12 (CYANOCOBALAMIN) tablet 100 mcg  100 mcg Oral Daily Vassie Loll, MD   100 mcg at 06/24/17 0831     Discharge Medications: Medication List     STOP taking  these medications   terbinafine 250 MG tablet Commonly known as:  LAMISIL     TAKE these medications   aspirin 81 MG chewable tablet Chew 81 mg by mouth daily.   benzonatate 100 MG capsule Commonly known as:  TESSALON PERLES Take 1 capsule (100 mg total) by mouth 3 (three) times daily as needed for cough.   CENTRUM SILVER PO Take 1 tablet by mouth daily.   clopidogrel 75 MG tablet Commonly known as:  PLAVIX Take 75 mg by mouth daily.   cyanocobalamin 1000 MCG tablet Take 1 tablet (  1,000 mcg total) by mouth daily. Start taking on:  06/25/2017 What changed:    medication strength  how much to take   diltiazem 30 MG tablet Commonly known as:  CARDIZEM Take 30 mg by mouth 3 (three) times daily before meals. For swallowing before meals   docusate sodium 100 MG capsule Commonly known as:  COLACE Take 1 capsule (100 mg total) by mouth 2 (two) times daily.   doxycycline 100 MG tablet Commonly known as:  VIBRA-TABS Take 1 tablet (100 mg total) by mouth every 12 (twelve) hours.   ENSURE Take 237 mLs by mouth 2 (two) times daily between meals.   furosemide 40 MG tablet Commonly known as:  LASIX Take 1 tablet (40 mg total) by mouth daily. What changed:    medication strength  how much to take  how to take this  when to take this   guaiFENesin-dextromethorphan 100-10 MG/5ML syrup Commonly known as:  ROBITUSSIN DM Take 5 mLs by mouth every 4 (four) hours as needed for cough.   Ipratropium-Albuterol 20-100 MCG/ACT Aers respimat Commonly known as:  COMBIVENT Inhale 1 puff into the lungs every 6 (six) hours as needed for wheezing or shortness of breath.   meprobamate 200 MG tablet Commonly known as:  EQUANIL Take 1 tablet (200 mg total) by mouth 3 (three) times daily.   methocarbamol 500 MG tablet Commonly known as:  ROBAXIN Take 1 tablet (500 mg total) by mouth every 8 (eight) hours as needed for muscle spasms.   oxybutynin 5 MG tablet Commonly  known as:  DITROPAN Take 5 mg by mouth every 8 (eight) hours as needed for bladder spasms.   oxyCODONE-acetaminophen 5-325 MG tablet Commonly known as:  PERCOCET/ROXICET Take 1 tablet by mouth every 4 (four) hours as needed for moderate pain.   polyethylene glycol packet Commonly known as:  MIRALAX / GLYCOLAX Take 17 g by mouth daily as needed for mild constipation.   predniSONE 20 MG tablet Commonly known as:  DELTASONE Take 3 tablets by mouth daily times 1 day; then 2 tablets by mouth daily times 2 days; then 1 tablet by mouth daily times 3 days; then half tablet by mouth daily times 3 days and stop prednisone.   senna 8.6 MG Tabs tablet Commonly known as:  SENOKOT Take 1 tablet (8.6 mg total) by mouth 2 (two) times daily.   sertraline 50 MG tablet Commonly known as:  ZOLOFT Take 50 mg by mouth daily.      Relevant Imaging Results:  Relevant Lab Results:   Additional Information    Debbie Webb, Juleen China, LCSW

## 2017-06-24 NOTE — Discharge Summary (Signed)
Physician Discharge Summary  Debbie Webb NFA:[id number] DOB: 02-02-1921 DOA: 06/22/2017  PCP: [name], MD  Admit date: 06/22/2017 Discharge date: 06/24/2017  Time spent: 35 minutes  Recommendations for Outpatient Follow-up:  1. Repeat basic metabolic panel to follow electrolytes and renal function 2. Reassess volume status and further adjust diuretic regimen as needed.  Discharge Diagnoses:  Principal Problem:   Acute on chronic diastolic CHF (congestive heart failure) (HCC) Active Problems:   GERD (gastroesophageal reflux disease)   Stroke (HCC)   Essential hypertension, benign   HLD (hyperlipidemia)   COPD with acute bronchitis (HCC)   Pressure injury of skin   Discharge Condition: Stable and improved.  Patient discharged back to assisted living facility with instruction to follow-up with PCP in 10 days.  Diet recommendation: Heart healthy diet (low sodium, less than 2000 mg in 24 hours).  Filed Weights   06/22/17 1026 06/23/17 2243 06/24/17 0600  Weight: 53.5 kg (117 lb 15.1 oz) 53.7 kg (118 lb 6.2 oz) 53.3 kg (117 lb 8.1 oz)    History of present illness:  84 year old female with a past medical history significant for hypertension, diastolic heart failure, gastroesophageal reflux disease, COPD and prior stroke (with mild to moderate left side hemiparesis as residual deficit);who was brought to the emergency department by her son secondary to increased shortness of breath. Patient reports that for the last 4 days she has noticed increase shortness of breath, difficulty breathing with activity, productive cough and just not feeling good. Patient denies chest pain, palpitations, fever/chills, nausea, vomiting, abdominal pain, dysuria, melena or hematochezia. Patient has also noticed orthopnea and increased swelling in her lower extremities.    Hospital Course:  1-acute on chronic diastolic CHF (congestive heart failure) (HCC) -will discharge on lasix 40 mg  daily -Continue daily weights and low-sodium diet  -No beta-blocker secondary to bradycardia and no ACE inhibitors in the setting of acute exacerbation with soft blood pressure. -2-D echo with diastolic HF, preserved EF and no wall motion abnormalities. -Troponin remain in a flat elevation suggesting demand ischemia in the setting of acute heart failure exacerbation. -Continue aspirin -no oxygen needed at discharge  2-COPD exacerbation and bronchiectasis -Patient with underlying COPD has been experiencing upper respiratory infection symptoms for over 4 days now. -No frank wheezing appreciated on exam, patient with improved air movement. -Discharged on steroids tapering, PRN combivent and doxycycline. -No oxygen supplementation needed at discharge. -Continue flutter valve and Mucinex -Influenza by PCR negative.  3-GERD (gastroesophageal reflux disease) -Continue PPI  4-Stroke (HCC):Prior history of stroke without significant residual deficit. -Overall stable -Continue aspirin and Plavix for secondary prevention -No new focal deficits/weakness currently.  5-essential hypertension, benign -Continue Cardizem at current dose -Continue heart healthy diet -patient lasix adjusted to 40 mg daily (which would also contribute controlling BP)  6-depression/anxiety -continue Zoloft and equanil -Mood stable -No suicidal ideation or hallucinations  7-chronic pain in her hip after hip surgery  -continue PRN analgesics -continue bowel regimen  8-physical deconditioning: -Will arrange for home health PT at discharge.  Procedures: 2D echo: Demonstrating preserved ejection fraction, no wall motion abnormalities grade 1 diastolic dysfunction.  Consultations:  None  Discharge Exam: Vitals:   06/24/17 0757 06/24/17 1149  BP:    Pulse:    Resp:    Temp:    SpO2: 100% 92%     General: Feeling a lot better, denies chest pain, no nausea or vomiting. Breathing has improved  significantly and is not requiring oxygen supplementation.  Cardiovascular: Rate  controlled, No rubs, no gallop.  no JVD and no Le swelling on exam.  Respiratory:  Overall with improved air movement, no frank crackles, no wheezing; positive scattered rhonchi.   Abdomen: Soft, nontender, nondistended, positive bowel sounds  Musculoskeletal: Trace edema bilaterally, no cyanosis, no clubbing  Skin: Patient with a stage I in her sacrum; no superimposed infection, no drainage.  No other open wounds or induration.  Patient pressure injury was present prior to admission.     Discharge Instructions   Discharge Instructions    Diet - low sodium heart healthy   Complete by:  As directed    Discharge instructions   Complete by:  As directed    Check weight on daily basis Follow heart healthy diet (less than 2 g on daily basis) Maintain adequate hydration Follow-up with PCP in 10 days Please repeat basic metabolic panel in 1 week to follow renal function and electrolytes.   Increase activity slowly   Complete by:  As directed      Allergies as of 06/24/2017      Reactions   No Known Allergies       Medication List    STOP taking these medications   terbinafine 250 MG tablet Commonly known as:  LAMISIL     TAKE these medications   aspirin 81 MG chewable tablet Chew 81 mg by mouth daily.   benzonatate 100 MG capsule Commonly known as:  TESSALON PERLES Take 1 capsule (100 mg total) by mouth 3 (three) times daily as needed for cough.   CENTRUM SILVER PO Take 1 tablet by mouth daily.   clopidogrel 75 MG tablet Commonly known as:  PLAVIX Take 75 mg by mouth daily.   cyanocobalamin 1000 MCG tablet Take 1 tablet (1,000 mcg total) by mouth daily. Start taking on:  06/25/2017 What changed:    medication strength  how much to take   diltiazem 30 MG tablet Commonly known as:  CARDIZEM Take 30 mg by mouth 3 (three) times daily before meals. For swallowing before meals    docusate sodium 100 MG capsule Commonly known as:  COLACE Take 1 capsule (100 mg total) by mouth 2 (two) times daily.   doxycycline 100 MG tablet Commonly known as:  VIBRA-TABS Take 1 tablet (100 mg total) by mouth every 12 (twelve) hours.   ENSURE Take 237 mLs by mouth 2 (two) times daily between meals.   furosemide 40 MG tablet Commonly known as:  LASIX Take 1 tablet (40 mg total) by mouth daily. What changed:    medication strength  how much to take  how to take this  when to take this   guaiFENesin-dextromethorphan 100-10 MG/5ML syrup Commonly known as:  ROBITUSSIN DM Take 5 mLs by mouth every 4 (four) hours as needed for cough.   Ipratropium-Albuterol 20-100 MCG/ACT Aers respimat Commonly known as:  COMBIVENT Inhale 1 puff into the lungs every 6 (six) hours as needed for wheezing or shortness of breath.   meprobamate 200 MG tablet Commonly known as:  EQUANIL Take 1 tablet (200 mg total) by mouth 3 (three) times daily.   methocarbamol 500 MG tablet Commonly known as:  ROBAXIN Take 1 tablet (500 mg total) by mouth every 8 (eight) hours as needed for muscle spasms.   oxybutynin 5 MG tablet Commonly known as:  DITROPAN Take 5 mg by mouth every 8 (eight) hours as needed for bladder spasms.   oxyCODONE-acetaminophen 5-325 MG tablet Commonly known as:  PERCOCET/ROXICET Take 1 tablet  by mouth every 4 (four) hours as needed for moderate pain.   polyethylene glycol packet Commonly known as:  MIRALAX / GLYCOLAX Take 17 g by mouth daily as needed for mild constipation.   predniSONE 20 MG tablet Commonly known as:  DELTASONE Take 3 tablets by mouth daily times 1 day; then 2 tablets by mouth daily times 2 days; then 1 tablet by mouth daily times 3 days; then half tablet by mouth daily times 3 days and stop prednisone.   senna 8.6 MG Tabs tablet Commonly known as:  SENOKOT Take 1 tablet (8.6 mg total) by mouth 2 (two) times daily.   sertraline 50 MG  tablet Commonly known as:  ZOLOFT Take 50 mg by mouth daily.      Allergies  Allergen Reactions  . No Known Allergies    Follow-up Information    [name], MD. Schedule an appointment as soon as possible for a visit in 10 day(s).   Specialty:  Internal Medicine Contact information: [address] [place] [address] [phone]           The results of significant diagnostics from this hospitalization (including imaging, microbiology, ancillary and laboratory) are listed below for reference.    Significant Diagnostic Studies: Dg Chest 2 View  Result Date: 06/22/2017 CLINICAL DATA:  Acute onset of cough and shortness of breath. EXAM: CHEST  2 VIEW COMPARISON:  Chest radiograph performed 01/29/2017 FINDINGS: The lungs are well-aerated. Vascular congestion is noted. Increased interstitial markings raise concern for pulmonary edema. There is no evidence of pleural effusion or pneumothorax. The heart is mildly enlarged. No acute osseous abnormalities are seen. Diffuse calcification is seen along the abdominal aorta. IMPRESSION: Vascular congestion and mild cardiomegaly. Increased interstitial markings raise concern for pulmonary edema. Electronically Signed   By: Roanna Raider M.D.   On: 06/22/2017 06:38    Microbiology: Recent Results (from the past 240 hour(s))  MRSA PCR Screening     Status: None   Collection Time: 06/22/17 11:04 AM  Result Value Ref Range Status   MRSA by PCR NEGATIVE NEGATIVE Final    Comment:        The GeneXpert MRSA Assay (FDA approved for NASAL specimens only), is one component of a comprehensive MRSA colonization surveillance program. It is not intended to diagnose MRSA infection nor to guide or monitor treatment for MRSA infections. Performed at [facility], [address] [place], [place] [address]      Labs: Basic Metabolic Panel: Recent Labs  Lab 06/22/17 0523 06/22/17 0919 06/23/17 0634 06/24/17 0606  NA 140  --   140 140  K 4.2  --  4.0 4.1  CL 106  --  101 99*  CO2 24  --  29 32  GLUCOSE 93  --  138* 126*  BUN 19  --  27* 38*  CREATININE 0.86  --  1.03* 1.17*  CALCIUM 9.5  --  9.5 9.9  MG  --  1.8  --   --    CBC: Recent Labs  Lab 06/22/17 0523  WBC 5.8  NEUTROABS 4.1  HGB 11.6*  HCT 36.4  MCV 94.1  PLT 150   Cardiac Enzymes: Recent Labs  Lab 06/22/17 0523 06/22/17 0919 06/22/17 1436 06/22/17 2021  TROPONINI 0.04* 0.04* 0.03* 0.03*   BNP: BNP (last 3 results) Recent Labs    12/27/16 0301 06/22/17 0523  BNP 101.0* 260.0*   CBG: Recent Labs  Lab 06/22/17 2030  GLUCAP 184*  Signed:  Vassie Loll MD.  [facility] 06/24/2017, 2:34 PM

## 2017-06-27 DIAGNOSIS — I422 Other hypertrophic cardiomyopathy: Secondary | ICD-10-CM | POA: Diagnosis not present

## 2017-06-27 DIAGNOSIS — I779 Disorder of arteries and arterioles, unspecified: Secondary | ICD-10-CM | POA: Diagnosis not present

## 2017-06-27 DIAGNOSIS — R600 Localized edema: Secondary | ICD-10-CM | POA: Diagnosis not present

## 2017-06-27 DIAGNOSIS — Z6821 Body mass index (BMI) 21.0-21.9, adult: Secondary | ICD-10-CM | POA: Diagnosis not present

## 2017-06-28 DIAGNOSIS — I5033 Acute on chronic diastolic (congestive) heart failure: Secondary | ICD-10-CM | POA: Diagnosis not present

## 2017-07-04 ENCOUNTER — Encounter: Payer: Self-pay | Admitting: Obstetrics & Gynecology

## 2017-07-04 ENCOUNTER — Ambulatory Visit: Payer: Medicare Other | Admitting: Obstetrics & Gynecology

## 2017-07-04 ENCOUNTER — Other Ambulatory Visit: Payer: Self-pay

## 2017-07-04 VITALS — BP 118/74 | HR 89 | Ht 64.0 in | Wt 117.0 lb

## 2017-07-04 DIAGNOSIS — N362 Urethral caruncle: Secondary | ICD-10-CM

## 2017-07-04 DIAGNOSIS — I5033 Acute on chronic diastolic (congestive) heart failure: Secondary | ICD-10-CM | POA: Diagnosis not present

## 2017-07-05 DIAGNOSIS — I5033 Acute on chronic diastolic (congestive) heart failure: Secondary | ICD-10-CM | POA: Diagnosis not present

## 2017-07-09 DIAGNOSIS — I5033 Acute on chronic diastolic (congestive) heart failure: Secondary | ICD-10-CM | POA: Diagnosis not present

## 2017-07-12 DIAGNOSIS — Z79891 Long term (current) use of opiate analgesic: Secondary | ICD-10-CM | POA: Diagnosis not present

## 2017-07-12 DIAGNOSIS — Z7902 Long term (current) use of antithrombotics/antiplatelets: Secondary | ICD-10-CM | POA: Diagnosis not present

## 2017-07-12 DIAGNOSIS — Z7982 Long term (current) use of aspirin: Secondary | ICD-10-CM | POA: Diagnosis not present

## 2017-07-12 DIAGNOSIS — I110 Hypertensive heart disease with heart failure: Secondary | ICD-10-CM | POA: Diagnosis not present

## 2017-07-12 DIAGNOSIS — Z8673 Personal history of transient ischemic attack (TIA), and cerebral infarction without residual deficits: Secondary | ICD-10-CM | POA: Diagnosis not present

## 2017-07-12 DIAGNOSIS — I5033 Acute on chronic diastolic (congestive) heart failure: Secondary | ICD-10-CM | POA: Diagnosis not present

## 2017-07-12 DIAGNOSIS — M25552 Pain in left hip: Secondary | ICD-10-CM | POA: Diagnosis not present

## 2017-07-12 DIAGNOSIS — J441 Chronic obstructive pulmonary disease with (acute) exacerbation: Secondary | ICD-10-CM | POA: Diagnosis not present

## 2017-07-16 DIAGNOSIS — I5033 Acute on chronic diastolic (congestive) heart failure: Secondary | ICD-10-CM | POA: Diagnosis not present

## 2017-07-16 DIAGNOSIS — Z79891 Long term (current) use of opiate analgesic: Secondary | ICD-10-CM | POA: Diagnosis not present

## 2017-07-16 DIAGNOSIS — J441 Chronic obstructive pulmonary disease with (acute) exacerbation: Secondary | ICD-10-CM | POA: Diagnosis not present

## 2017-07-16 DIAGNOSIS — I6789 Other cerebrovascular disease: Secondary | ICD-10-CM | POA: Diagnosis not present

## 2017-07-16 DIAGNOSIS — I110 Hypertensive heart disease with heart failure: Secondary | ICD-10-CM | POA: Diagnosis not present

## 2017-07-16 DIAGNOSIS — M25552 Pain in left hip: Secondary | ICD-10-CM | POA: Diagnosis not present

## 2017-07-16 DIAGNOSIS — Z7982 Long term (current) use of aspirin: Secondary | ICD-10-CM | POA: Diagnosis not present

## 2017-07-16 DIAGNOSIS — Z8673 Personal history of transient ischemic attack (TIA), and cerebral infarction without residual deficits: Secondary | ICD-10-CM | POA: Diagnosis not present

## 2017-07-16 DIAGNOSIS — Z7902 Long term (current) use of antithrombotics/antiplatelets: Secondary | ICD-10-CM | POA: Diagnosis not present

## 2017-07-19 DIAGNOSIS — I5033 Acute on chronic diastolic (congestive) heart failure: Secondary | ICD-10-CM | POA: Diagnosis not present

## 2017-07-19 DIAGNOSIS — Z8673 Personal history of transient ischemic attack (TIA), and cerebral infarction without residual deficits: Secondary | ICD-10-CM | POA: Diagnosis not present

## 2017-07-19 DIAGNOSIS — Z7982 Long term (current) use of aspirin: Secondary | ICD-10-CM | POA: Diagnosis not present

## 2017-07-19 DIAGNOSIS — Z7902 Long term (current) use of antithrombotics/antiplatelets: Secondary | ICD-10-CM | POA: Diagnosis not present

## 2017-07-19 DIAGNOSIS — J441 Chronic obstructive pulmonary disease with (acute) exacerbation: Secondary | ICD-10-CM | POA: Diagnosis not present

## 2017-07-19 DIAGNOSIS — Z79891 Long term (current) use of opiate analgesic: Secondary | ICD-10-CM | POA: Diagnosis not present

## 2017-07-19 DIAGNOSIS — M25552 Pain in left hip: Secondary | ICD-10-CM | POA: Diagnosis not present

## 2017-07-19 DIAGNOSIS — I110 Hypertensive heart disease with heart failure: Secondary | ICD-10-CM | POA: Diagnosis not present

## 2017-07-23 DIAGNOSIS — Z8673 Personal history of transient ischemic attack (TIA), and cerebral infarction without residual deficits: Secondary | ICD-10-CM | POA: Diagnosis not present

## 2017-07-23 DIAGNOSIS — Z7982 Long term (current) use of aspirin: Secondary | ICD-10-CM | POA: Diagnosis not present

## 2017-07-23 DIAGNOSIS — I5033 Acute on chronic diastolic (congestive) heart failure: Secondary | ICD-10-CM | POA: Diagnosis not present

## 2017-07-23 DIAGNOSIS — I110 Hypertensive heart disease with heart failure: Secondary | ICD-10-CM | POA: Diagnosis not present

## 2017-07-23 DIAGNOSIS — Z7902 Long term (current) use of antithrombotics/antiplatelets: Secondary | ICD-10-CM | POA: Diagnosis not present

## 2017-07-23 DIAGNOSIS — M25552 Pain in left hip: Secondary | ICD-10-CM | POA: Diagnosis not present

## 2017-07-23 DIAGNOSIS — J441 Chronic obstructive pulmonary disease with (acute) exacerbation: Secondary | ICD-10-CM | POA: Diagnosis not present

## 2017-07-23 DIAGNOSIS — Z79891 Long term (current) use of opiate analgesic: Secondary | ICD-10-CM | POA: Diagnosis not present

## 2017-07-26 DIAGNOSIS — I5033 Acute on chronic diastolic (congestive) heart failure: Secondary | ICD-10-CM | POA: Diagnosis not present

## 2017-07-26 DIAGNOSIS — M25552 Pain in left hip: Secondary | ICD-10-CM | POA: Diagnosis not present

## 2017-07-26 DIAGNOSIS — Z7982 Long term (current) use of aspirin: Secondary | ICD-10-CM | POA: Diagnosis not present

## 2017-07-26 DIAGNOSIS — I110 Hypertensive heart disease with heart failure: Secondary | ICD-10-CM | POA: Diagnosis not present

## 2017-07-26 DIAGNOSIS — Z7902 Long term (current) use of antithrombotics/antiplatelets: Secondary | ICD-10-CM | POA: Diagnosis not present

## 2017-07-26 DIAGNOSIS — Z79891 Long term (current) use of opiate analgesic: Secondary | ICD-10-CM | POA: Diagnosis not present

## 2017-07-26 DIAGNOSIS — Z8673 Personal history of transient ischemic attack (TIA), and cerebral infarction without residual deficits: Secondary | ICD-10-CM | POA: Diagnosis not present

## 2017-07-26 DIAGNOSIS — J441 Chronic obstructive pulmonary disease with (acute) exacerbation: Secondary | ICD-10-CM | POA: Diagnosis not present

## 2017-07-29 DIAGNOSIS — Z7982 Long term (current) use of aspirin: Secondary | ICD-10-CM | POA: Diagnosis not present

## 2017-07-29 DIAGNOSIS — Z8673 Personal history of transient ischemic attack (TIA), and cerebral infarction without residual deficits: Secondary | ICD-10-CM | POA: Diagnosis not present

## 2017-07-29 DIAGNOSIS — Z79891 Long term (current) use of opiate analgesic: Secondary | ICD-10-CM | POA: Diagnosis not present

## 2017-07-29 DIAGNOSIS — M25552 Pain in left hip: Secondary | ICD-10-CM | POA: Diagnosis not present

## 2017-07-29 DIAGNOSIS — J441 Chronic obstructive pulmonary disease with (acute) exacerbation: Secondary | ICD-10-CM | POA: Diagnosis not present

## 2017-07-29 DIAGNOSIS — Z7902 Long term (current) use of antithrombotics/antiplatelets: Secondary | ICD-10-CM | POA: Diagnosis not present

## 2017-07-29 DIAGNOSIS — I5033 Acute on chronic diastolic (congestive) heart failure: Secondary | ICD-10-CM | POA: Diagnosis not present

## 2017-07-29 DIAGNOSIS — I110 Hypertensive heart disease with heart failure: Secondary | ICD-10-CM | POA: Diagnosis not present

## 2017-08-01 DIAGNOSIS — J441 Chronic obstructive pulmonary disease with (acute) exacerbation: Secondary | ICD-10-CM | POA: Diagnosis not present

## 2017-08-01 DIAGNOSIS — M25552 Pain in left hip: Secondary | ICD-10-CM | POA: Diagnosis not present

## 2017-08-01 DIAGNOSIS — I5033 Acute on chronic diastolic (congestive) heart failure: Secondary | ICD-10-CM | POA: Diagnosis not present

## 2017-08-01 DIAGNOSIS — Z79891 Long term (current) use of opiate analgesic: Secondary | ICD-10-CM | POA: Diagnosis not present

## 2017-08-01 DIAGNOSIS — Z7982 Long term (current) use of aspirin: Secondary | ICD-10-CM | POA: Diagnosis not present

## 2017-08-01 DIAGNOSIS — Z7902 Long term (current) use of antithrombotics/antiplatelets: Secondary | ICD-10-CM | POA: Diagnosis not present

## 2017-08-01 DIAGNOSIS — I110 Hypertensive heart disease with heart failure: Secondary | ICD-10-CM | POA: Diagnosis not present

## 2017-08-01 DIAGNOSIS — Z8673 Personal history of transient ischemic attack (TIA), and cerebral infarction without residual deficits: Secondary | ICD-10-CM | POA: Diagnosis not present

## 2017-08-05 DIAGNOSIS — Z79891 Long term (current) use of opiate analgesic: Secondary | ICD-10-CM | POA: Diagnosis not present

## 2017-08-05 DIAGNOSIS — I110 Hypertensive heart disease with heart failure: Secondary | ICD-10-CM | POA: Diagnosis not present

## 2017-08-05 DIAGNOSIS — I5033 Acute on chronic diastolic (congestive) heart failure: Secondary | ICD-10-CM | POA: Diagnosis not present

## 2017-08-05 DIAGNOSIS — M25552 Pain in left hip: Secondary | ICD-10-CM | POA: Diagnosis not present

## 2017-08-05 DIAGNOSIS — Z7982 Long term (current) use of aspirin: Secondary | ICD-10-CM | POA: Diagnosis not present

## 2017-08-05 DIAGNOSIS — J441 Chronic obstructive pulmonary disease with (acute) exacerbation: Secondary | ICD-10-CM | POA: Diagnosis not present

## 2017-08-05 DIAGNOSIS — Z7902 Long term (current) use of antithrombotics/antiplatelets: Secondary | ICD-10-CM | POA: Diagnosis not present

## 2017-08-05 DIAGNOSIS — Z8673 Personal history of transient ischemic attack (TIA), and cerebral infarction without residual deficits: Secondary | ICD-10-CM | POA: Diagnosis not present

## 2017-08-08 DIAGNOSIS — I110 Hypertensive heart disease with heart failure: Secondary | ICD-10-CM | POA: Diagnosis not present

## 2017-08-08 DIAGNOSIS — I5033 Acute on chronic diastolic (congestive) heart failure: Secondary | ICD-10-CM | POA: Diagnosis not present

## 2017-08-08 DIAGNOSIS — Z79891 Long term (current) use of opiate analgesic: Secondary | ICD-10-CM | POA: Diagnosis not present

## 2017-08-08 DIAGNOSIS — Z8673 Personal history of transient ischemic attack (TIA), and cerebral infarction without residual deficits: Secondary | ICD-10-CM | POA: Diagnosis not present

## 2017-08-08 DIAGNOSIS — J441 Chronic obstructive pulmonary disease with (acute) exacerbation: Secondary | ICD-10-CM | POA: Diagnosis not present

## 2017-08-08 DIAGNOSIS — Z7902 Long term (current) use of antithrombotics/antiplatelets: Secondary | ICD-10-CM | POA: Diagnosis not present

## 2017-08-08 DIAGNOSIS — M25552 Pain in left hip: Secondary | ICD-10-CM | POA: Diagnosis not present

## 2017-08-08 DIAGNOSIS — Z7982 Long term (current) use of aspirin: Secondary | ICD-10-CM | POA: Diagnosis not present

## 2017-08-12 DIAGNOSIS — Z9181 History of falling: Secondary | ICD-10-CM | POA: Diagnosis not present

## 2017-08-12 DIAGNOSIS — I5032 Chronic diastolic (congestive) heart failure: Secondary | ICD-10-CM | POA: Diagnosis not present

## 2017-08-12 DIAGNOSIS — I110 Hypertensive heart disease with heart failure: Secondary | ICD-10-CM | POA: Diagnosis not present

## 2017-08-12 DIAGNOSIS — Z7982 Long term (current) use of aspirin: Secondary | ICD-10-CM | POA: Diagnosis not present

## 2017-08-12 DIAGNOSIS — Z79891 Long term (current) use of opiate analgesic: Secondary | ICD-10-CM | POA: Diagnosis not present

## 2017-08-12 DIAGNOSIS — Z7902 Long term (current) use of antithrombotics/antiplatelets: Secondary | ICD-10-CM | POA: Diagnosis not present

## 2017-08-12 DIAGNOSIS — I69393 Ataxia following cerebral infarction: Secondary | ICD-10-CM | POA: Diagnosis not present

## 2017-08-12 DIAGNOSIS — J449 Chronic obstructive pulmonary disease, unspecified: Secondary | ICD-10-CM | POA: Diagnosis not present

## 2017-08-12 DIAGNOSIS — Z8673 Personal history of transient ischemic attack (TIA), and cerebral infarction without residual deficits: Secondary | ICD-10-CM | POA: Diagnosis not present

## 2017-08-12 DIAGNOSIS — I69354 Hemiplegia and hemiparesis following cerebral infarction affecting left non-dominant side: Secondary | ICD-10-CM | POA: Diagnosis not present

## 2017-08-15 DIAGNOSIS — I110 Hypertensive heart disease with heart failure: Secondary | ICD-10-CM | POA: Diagnosis not present

## 2017-08-15 DIAGNOSIS — Z9181 History of falling: Secondary | ICD-10-CM | POA: Diagnosis not present

## 2017-08-15 DIAGNOSIS — Z8673 Personal history of transient ischemic attack (TIA), and cerebral infarction without residual deficits: Secondary | ICD-10-CM | POA: Diagnosis not present

## 2017-08-15 DIAGNOSIS — Z7982 Long term (current) use of aspirin: Secondary | ICD-10-CM | POA: Diagnosis not present

## 2017-08-15 DIAGNOSIS — Z79891 Long term (current) use of opiate analgesic: Secondary | ICD-10-CM | POA: Diagnosis not present

## 2017-08-15 DIAGNOSIS — Z7902 Long term (current) use of antithrombotics/antiplatelets: Secondary | ICD-10-CM | POA: Diagnosis not present

## 2017-08-15 DIAGNOSIS — I5032 Chronic diastolic (congestive) heart failure: Secondary | ICD-10-CM | POA: Diagnosis not present

## 2017-08-15 DIAGNOSIS — J449 Chronic obstructive pulmonary disease, unspecified: Secondary | ICD-10-CM | POA: Diagnosis not present

## 2017-08-15 DIAGNOSIS — I69393 Ataxia following cerebral infarction: Secondary | ICD-10-CM | POA: Diagnosis not present

## 2017-08-15 DIAGNOSIS — I69354 Hemiplegia and hemiparesis following cerebral infarction affecting left non-dominant side: Secondary | ICD-10-CM | POA: Diagnosis not present

## 2017-08-16 DIAGNOSIS — I69393 Ataxia following cerebral infarction: Secondary | ICD-10-CM | POA: Diagnosis not present

## 2017-08-16 DIAGNOSIS — I5032 Chronic diastolic (congestive) heart failure: Secondary | ICD-10-CM | POA: Diagnosis not present

## 2017-08-16 DIAGNOSIS — Z7902 Long term (current) use of antithrombotics/antiplatelets: Secondary | ICD-10-CM | POA: Diagnosis not present

## 2017-08-16 DIAGNOSIS — Z7982 Long term (current) use of aspirin: Secondary | ICD-10-CM | POA: Diagnosis not present

## 2017-08-16 DIAGNOSIS — J449 Chronic obstructive pulmonary disease, unspecified: Secondary | ICD-10-CM | POA: Diagnosis not present

## 2017-08-16 DIAGNOSIS — I6789 Other cerebrovascular disease: Secondary | ICD-10-CM | POA: Diagnosis not present

## 2017-08-16 DIAGNOSIS — I69354 Hemiplegia and hemiparesis following cerebral infarction affecting left non-dominant side: Secondary | ICD-10-CM | POA: Diagnosis not present

## 2017-08-16 DIAGNOSIS — Z79891 Long term (current) use of opiate analgesic: Secondary | ICD-10-CM | POA: Diagnosis not present

## 2017-08-16 DIAGNOSIS — Z8673 Personal history of transient ischemic attack (TIA), and cerebral infarction without residual deficits: Secondary | ICD-10-CM | POA: Diagnosis not present

## 2017-08-16 DIAGNOSIS — Z9181 History of falling: Secondary | ICD-10-CM | POA: Diagnosis not present

## 2017-08-16 DIAGNOSIS — I110 Hypertensive heart disease with heart failure: Secondary | ICD-10-CM | POA: Diagnosis not present

## 2017-08-19 DIAGNOSIS — Z9181 History of falling: Secondary | ICD-10-CM | POA: Diagnosis not present

## 2017-08-19 DIAGNOSIS — Z7982 Long term (current) use of aspirin: Secondary | ICD-10-CM | POA: Diagnosis not present

## 2017-08-19 DIAGNOSIS — Z8673 Personal history of transient ischemic attack (TIA), and cerebral infarction without residual deficits: Secondary | ICD-10-CM | POA: Diagnosis not present

## 2017-08-19 DIAGNOSIS — J449 Chronic obstructive pulmonary disease, unspecified: Secondary | ICD-10-CM | POA: Diagnosis not present

## 2017-08-19 DIAGNOSIS — I5032 Chronic diastolic (congestive) heart failure: Secondary | ICD-10-CM | POA: Diagnosis not present

## 2017-08-19 DIAGNOSIS — I110 Hypertensive heart disease with heart failure: Secondary | ICD-10-CM | POA: Diagnosis not present

## 2017-08-19 DIAGNOSIS — Z7902 Long term (current) use of antithrombotics/antiplatelets: Secondary | ICD-10-CM | POA: Diagnosis not present

## 2017-08-19 DIAGNOSIS — I69393 Ataxia following cerebral infarction: Secondary | ICD-10-CM | POA: Diagnosis not present

## 2017-08-19 DIAGNOSIS — Z79891 Long term (current) use of opiate analgesic: Secondary | ICD-10-CM | POA: Diagnosis not present

## 2017-08-19 DIAGNOSIS — I69354 Hemiplegia and hemiparesis following cerebral infarction affecting left non-dominant side: Secondary | ICD-10-CM | POA: Diagnosis not present

## 2017-08-21 DIAGNOSIS — I69354 Hemiplegia and hemiparesis following cerebral infarction affecting left non-dominant side: Secondary | ICD-10-CM | POA: Diagnosis not present

## 2017-08-21 DIAGNOSIS — Z7982 Long term (current) use of aspirin: Secondary | ICD-10-CM | POA: Diagnosis not present

## 2017-08-21 DIAGNOSIS — I110 Hypertensive heart disease with heart failure: Secondary | ICD-10-CM | POA: Diagnosis not present

## 2017-08-21 DIAGNOSIS — J449 Chronic obstructive pulmonary disease, unspecified: Secondary | ICD-10-CM | POA: Diagnosis not present

## 2017-08-21 DIAGNOSIS — I5032 Chronic diastolic (congestive) heart failure: Secondary | ICD-10-CM | POA: Diagnosis not present

## 2017-08-21 DIAGNOSIS — Z7902 Long term (current) use of antithrombotics/antiplatelets: Secondary | ICD-10-CM | POA: Diagnosis not present

## 2017-08-21 DIAGNOSIS — Z9181 History of falling: Secondary | ICD-10-CM | POA: Diagnosis not present

## 2017-08-21 DIAGNOSIS — Z79891 Long term (current) use of opiate analgesic: Secondary | ICD-10-CM | POA: Diagnosis not present

## 2017-08-21 DIAGNOSIS — Z8673 Personal history of transient ischemic attack (TIA), and cerebral infarction without residual deficits: Secondary | ICD-10-CM | POA: Diagnosis not present

## 2017-08-21 DIAGNOSIS — I69393 Ataxia following cerebral infarction: Secondary | ICD-10-CM | POA: Diagnosis not present

## 2017-08-22 DIAGNOSIS — J449 Chronic obstructive pulmonary disease, unspecified: Secondary | ICD-10-CM | POA: Diagnosis not present

## 2017-08-22 DIAGNOSIS — I69354 Hemiplegia and hemiparesis following cerebral infarction affecting left non-dominant side: Secondary | ICD-10-CM | POA: Diagnosis not present

## 2017-08-22 DIAGNOSIS — Z7982 Long term (current) use of aspirin: Secondary | ICD-10-CM | POA: Diagnosis not present

## 2017-08-22 DIAGNOSIS — I5032 Chronic diastolic (congestive) heart failure: Secondary | ICD-10-CM | POA: Diagnosis not present

## 2017-08-22 DIAGNOSIS — Z7902 Long term (current) use of antithrombotics/antiplatelets: Secondary | ICD-10-CM | POA: Diagnosis not present

## 2017-08-22 DIAGNOSIS — I69393 Ataxia following cerebral infarction: Secondary | ICD-10-CM | POA: Diagnosis not present

## 2017-08-22 DIAGNOSIS — I110 Hypertensive heart disease with heart failure: Secondary | ICD-10-CM | POA: Diagnosis not present

## 2017-08-22 DIAGNOSIS — Z9181 History of falling: Secondary | ICD-10-CM | POA: Diagnosis not present

## 2017-08-22 DIAGNOSIS — Z8673 Personal history of transient ischemic attack (TIA), and cerebral infarction without residual deficits: Secondary | ICD-10-CM | POA: Diagnosis not present

## 2017-08-22 DIAGNOSIS — Z79891 Long term (current) use of opiate analgesic: Secondary | ICD-10-CM | POA: Diagnosis not present

## 2017-08-27 DIAGNOSIS — J449 Chronic obstructive pulmonary disease, unspecified: Secondary | ICD-10-CM | POA: Diagnosis not present

## 2017-08-27 DIAGNOSIS — I5032 Chronic diastolic (congestive) heart failure: Secondary | ICD-10-CM | POA: Diagnosis not present

## 2017-08-27 DIAGNOSIS — I110 Hypertensive heart disease with heart failure: Secondary | ICD-10-CM | POA: Diagnosis not present

## 2017-08-27 DIAGNOSIS — I69393 Ataxia following cerebral infarction: Secondary | ICD-10-CM | POA: Diagnosis not present

## 2017-08-28 DIAGNOSIS — I5032 Chronic diastolic (congestive) heart failure: Secondary | ICD-10-CM | POA: Diagnosis not present

## 2017-08-28 DIAGNOSIS — Z79891 Long term (current) use of opiate analgesic: Secondary | ICD-10-CM | POA: Diagnosis not present

## 2017-08-28 DIAGNOSIS — I110 Hypertensive heart disease with heart failure: Secondary | ICD-10-CM | POA: Diagnosis not present

## 2017-08-28 DIAGNOSIS — J449 Chronic obstructive pulmonary disease, unspecified: Secondary | ICD-10-CM | POA: Diagnosis not present

## 2017-08-28 DIAGNOSIS — I69393 Ataxia following cerebral infarction: Secondary | ICD-10-CM | POA: Diagnosis not present

## 2017-08-28 DIAGNOSIS — Z9181 History of falling: Secondary | ICD-10-CM | POA: Diagnosis not present

## 2017-08-28 DIAGNOSIS — I69354 Hemiplegia and hemiparesis following cerebral infarction affecting left non-dominant side: Secondary | ICD-10-CM | POA: Diagnosis not present

## 2017-08-28 DIAGNOSIS — Z7902 Long term (current) use of antithrombotics/antiplatelets: Secondary | ICD-10-CM | POA: Diagnosis not present

## 2017-08-28 DIAGNOSIS — Z7982 Long term (current) use of aspirin: Secondary | ICD-10-CM | POA: Diagnosis not present

## 2017-08-28 DIAGNOSIS — Z8673 Personal history of transient ischemic attack (TIA), and cerebral infarction without residual deficits: Secondary | ICD-10-CM | POA: Diagnosis not present

## 2017-08-30 DIAGNOSIS — I110 Hypertensive heart disease with heart failure: Secondary | ICD-10-CM | POA: Diagnosis not present

## 2017-08-30 DIAGNOSIS — J449 Chronic obstructive pulmonary disease, unspecified: Secondary | ICD-10-CM | POA: Diagnosis not present

## 2017-08-30 DIAGNOSIS — Z79891 Long term (current) use of opiate analgesic: Secondary | ICD-10-CM | POA: Diagnosis not present

## 2017-08-30 DIAGNOSIS — Z7902 Long term (current) use of antithrombotics/antiplatelets: Secondary | ICD-10-CM | POA: Diagnosis not present

## 2017-08-30 DIAGNOSIS — I5032 Chronic diastolic (congestive) heart failure: Secondary | ICD-10-CM | POA: Diagnosis not present

## 2017-08-30 DIAGNOSIS — Z8673 Personal history of transient ischemic attack (TIA), and cerebral infarction without residual deficits: Secondary | ICD-10-CM | POA: Diagnosis not present

## 2017-08-30 DIAGNOSIS — I69354 Hemiplegia and hemiparesis following cerebral infarction affecting left non-dominant side: Secondary | ICD-10-CM | POA: Diagnosis not present

## 2017-08-30 DIAGNOSIS — Z9181 History of falling: Secondary | ICD-10-CM | POA: Diagnosis not present

## 2017-08-30 DIAGNOSIS — I69393 Ataxia following cerebral infarction: Secondary | ICD-10-CM | POA: Diagnosis not present

## 2017-08-30 DIAGNOSIS — Z7982 Long term (current) use of aspirin: Secondary | ICD-10-CM | POA: Diagnosis not present

## 2017-09-02 DIAGNOSIS — Z7902 Long term (current) use of antithrombotics/antiplatelets: Secondary | ICD-10-CM | POA: Diagnosis not present

## 2017-09-02 DIAGNOSIS — I110 Hypertensive heart disease with heart failure: Secondary | ICD-10-CM | POA: Diagnosis not present

## 2017-09-02 DIAGNOSIS — I69393 Ataxia following cerebral infarction: Secondary | ICD-10-CM | POA: Diagnosis not present

## 2017-09-02 DIAGNOSIS — Z9181 History of falling: Secondary | ICD-10-CM | POA: Diagnosis not present

## 2017-09-02 DIAGNOSIS — Z7982 Long term (current) use of aspirin: Secondary | ICD-10-CM | POA: Diagnosis not present

## 2017-09-02 DIAGNOSIS — I69354 Hemiplegia and hemiparesis following cerebral infarction affecting left non-dominant side: Secondary | ICD-10-CM | POA: Diagnosis not present

## 2017-09-02 DIAGNOSIS — I5032 Chronic diastolic (congestive) heart failure: Secondary | ICD-10-CM | POA: Diagnosis not present

## 2017-09-02 DIAGNOSIS — J449 Chronic obstructive pulmonary disease, unspecified: Secondary | ICD-10-CM | POA: Diagnosis not present

## 2017-09-02 DIAGNOSIS — Z8673 Personal history of transient ischemic attack (TIA), and cerebral infarction without residual deficits: Secondary | ICD-10-CM | POA: Diagnosis not present

## 2017-09-02 DIAGNOSIS — Z79891 Long term (current) use of opiate analgesic: Secondary | ICD-10-CM | POA: Diagnosis not present

## 2017-09-05 DIAGNOSIS — I69393 Ataxia following cerebral infarction: Secondary | ICD-10-CM | POA: Diagnosis not present

## 2017-09-05 DIAGNOSIS — I5032 Chronic diastolic (congestive) heart failure: Secondary | ICD-10-CM | POA: Diagnosis not present

## 2017-09-05 DIAGNOSIS — Z7902 Long term (current) use of antithrombotics/antiplatelets: Secondary | ICD-10-CM | POA: Diagnosis not present

## 2017-09-05 DIAGNOSIS — Z9181 History of falling: Secondary | ICD-10-CM | POA: Diagnosis not present

## 2017-09-05 DIAGNOSIS — Z79891 Long term (current) use of opiate analgesic: Secondary | ICD-10-CM | POA: Diagnosis not present

## 2017-09-05 DIAGNOSIS — J449 Chronic obstructive pulmonary disease, unspecified: Secondary | ICD-10-CM | POA: Diagnosis not present

## 2017-09-05 DIAGNOSIS — I69354 Hemiplegia and hemiparesis following cerebral infarction affecting left non-dominant side: Secondary | ICD-10-CM | POA: Diagnosis not present

## 2017-09-05 DIAGNOSIS — I110 Hypertensive heart disease with heart failure: Secondary | ICD-10-CM | POA: Diagnosis not present

## 2017-09-05 DIAGNOSIS — Z7982 Long term (current) use of aspirin: Secondary | ICD-10-CM | POA: Diagnosis not present

## 2017-09-05 DIAGNOSIS — Z8673 Personal history of transient ischemic attack (TIA), and cerebral infarction without residual deficits: Secondary | ICD-10-CM | POA: Diagnosis not present

## 2017-09-09 DIAGNOSIS — J449 Chronic obstructive pulmonary disease, unspecified: Secondary | ICD-10-CM | POA: Diagnosis not present

## 2017-09-09 DIAGNOSIS — I69393 Ataxia following cerebral infarction: Secondary | ICD-10-CM | POA: Diagnosis not present

## 2017-09-09 DIAGNOSIS — I5032 Chronic diastolic (congestive) heart failure: Secondary | ICD-10-CM | POA: Diagnosis not present

## 2017-09-09 DIAGNOSIS — Z9181 History of falling: Secondary | ICD-10-CM | POA: Diagnosis not present

## 2017-09-09 DIAGNOSIS — I69354 Hemiplegia and hemiparesis following cerebral infarction affecting left non-dominant side: Secondary | ICD-10-CM | POA: Diagnosis not present

## 2017-09-09 DIAGNOSIS — Z8673 Personal history of transient ischemic attack (TIA), and cerebral infarction without residual deficits: Secondary | ICD-10-CM | POA: Diagnosis not present

## 2017-09-09 DIAGNOSIS — Z79891 Long term (current) use of opiate analgesic: Secondary | ICD-10-CM | POA: Diagnosis not present

## 2017-09-09 DIAGNOSIS — Z7902 Long term (current) use of antithrombotics/antiplatelets: Secondary | ICD-10-CM | POA: Diagnosis not present

## 2017-09-09 DIAGNOSIS — I110 Hypertensive heart disease with heart failure: Secondary | ICD-10-CM | POA: Diagnosis not present

## 2017-09-09 DIAGNOSIS — Z7982 Long term (current) use of aspirin: Secondary | ICD-10-CM | POA: Diagnosis not present

## 2017-09-11 DIAGNOSIS — Z79891 Long term (current) use of opiate analgesic: Secondary | ICD-10-CM | POA: Diagnosis not present

## 2017-09-11 DIAGNOSIS — I110 Hypertensive heart disease with heart failure: Secondary | ICD-10-CM | POA: Diagnosis not present

## 2017-09-11 DIAGNOSIS — Z8673 Personal history of transient ischemic attack (TIA), and cerebral infarction without residual deficits: Secondary | ICD-10-CM | POA: Diagnosis not present

## 2017-09-11 DIAGNOSIS — I5032 Chronic diastolic (congestive) heart failure: Secondary | ICD-10-CM | POA: Diagnosis not present

## 2017-09-11 DIAGNOSIS — I69354 Hemiplegia and hemiparesis following cerebral infarction affecting left non-dominant side: Secondary | ICD-10-CM | POA: Diagnosis not present

## 2017-09-11 DIAGNOSIS — I69393 Ataxia following cerebral infarction: Secondary | ICD-10-CM | POA: Diagnosis not present

## 2017-09-11 DIAGNOSIS — Z9181 History of falling: Secondary | ICD-10-CM | POA: Diagnosis not present

## 2017-09-11 DIAGNOSIS — J449 Chronic obstructive pulmonary disease, unspecified: Secondary | ICD-10-CM | POA: Diagnosis not present

## 2017-09-11 DIAGNOSIS — Z7982 Long term (current) use of aspirin: Secondary | ICD-10-CM | POA: Diagnosis not present

## 2017-09-11 DIAGNOSIS — Z7902 Long term (current) use of antithrombotics/antiplatelets: Secondary | ICD-10-CM | POA: Diagnosis not present

## 2017-09-15 DIAGNOSIS — I6789 Other cerebrovascular disease: Secondary | ICD-10-CM | POA: Diagnosis not present

## 2017-09-16 DIAGNOSIS — I69354 Hemiplegia and hemiparesis following cerebral infarction affecting left non-dominant side: Secondary | ICD-10-CM | POA: Diagnosis not present

## 2017-09-16 DIAGNOSIS — Z79891 Long term (current) use of opiate analgesic: Secondary | ICD-10-CM | POA: Diagnosis not present

## 2017-09-16 DIAGNOSIS — Z7902 Long term (current) use of antithrombotics/antiplatelets: Secondary | ICD-10-CM | POA: Diagnosis not present

## 2017-09-16 DIAGNOSIS — I69393 Ataxia following cerebral infarction: Secondary | ICD-10-CM | POA: Diagnosis not present

## 2017-09-16 DIAGNOSIS — Z9181 History of falling: Secondary | ICD-10-CM | POA: Diagnosis not present

## 2017-09-16 DIAGNOSIS — I5032 Chronic diastolic (congestive) heart failure: Secondary | ICD-10-CM | POA: Diagnosis not present

## 2017-09-16 DIAGNOSIS — Z8673 Personal history of transient ischemic attack (TIA), and cerebral infarction without residual deficits: Secondary | ICD-10-CM | POA: Diagnosis not present

## 2017-09-16 DIAGNOSIS — J449 Chronic obstructive pulmonary disease, unspecified: Secondary | ICD-10-CM | POA: Diagnosis not present

## 2017-09-16 DIAGNOSIS — Z7982 Long term (current) use of aspirin: Secondary | ICD-10-CM | POA: Diagnosis not present

## 2017-09-16 DIAGNOSIS — I110 Hypertensive heart disease with heart failure: Secondary | ICD-10-CM | POA: Diagnosis not present

## 2017-09-19 DIAGNOSIS — Z7902 Long term (current) use of antithrombotics/antiplatelets: Secondary | ICD-10-CM | POA: Diagnosis not present

## 2017-09-19 DIAGNOSIS — I110 Hypertensive heart disease with heart failure: Secondary | ICD-10-CM | POA: Diagnosis not present

## 2017-09-19 DIAGNOSIS — K225 Diverticulum of esophagus, acquired: Secondary | ICD-10-CM | POA: Diagnosis not present

## 2017-09-19 DIAGNOSIS — L219 Seborrheic dermatitis, unspecified: Secondary | ICD-10-CM | POA: Diagnosis not present

## 2017-09-19 DIAGNOSIS — Z79891 Long term (current) use of opiate analgesic: Secondary | ICD-10-CM | POA: Diagnosis not present

## 2017-09-19 DIAGNOSIS — Z9181 History of falling: Secondary | ICD-10-CM | POA: Diagnosis not present

## 2017-09-19 DIAGNOSIS — Z8673 Personal history of transient ischemic attack (TIA), and cerebral infarction without residual deficits: Secondary | ICD-10-CM | POA: Diagnosis not present

## 2017-09-19 DIAGNOSIS — J449 Chronic obstructive pulmonary disease, unspecified: Secondary | ICD-10-CM | POA: Diagnosis not present

## 2017-09-19 DIAGNOSIS — I5032 Chronic diastolic (congestive) heart failure: Secondary | ICD-10-CM | POA: Diagnosis not present

## 2017-09-19 DIAGNOSIS — I69393 Ataxia following cerebral infarction: Secondary | ICD-10-CM | POA: Diagnosis not present

## 2017-09-19 DIAGNOSIS — Z6821 Body mass index (BMI) 21.0-21.9, adult: Secondary | ICD-10-CM | POA: Diagnosis not present

## 2017-09-19 DIAGNOSIS — I10 Essential (primary) hypertension: Secondary | ICD-10-CM | POA: Diagnosis not present

## 2017-09-19 DIAGNOSIS — R1310 Dysphagia, unspecified: Secondary | ICD-10-CM | POA: Diagnosis not present

## 2017-09-19 DIAGNOSIS — Z7982 Long term (current) use of aspirin: Secondary | ICD-10-CM | POA: Diagnosis not present

## 2017-09-19 DIAGNOSIS — I69354 Hemiplegia and hemiparesis following cerebral infarction affecting left non-dominant side: Secondary | ICD-10-CM | POA: Diagnosis not present

## 2017-09-23 DIAGNOSIS — I69393 Ataxia following cerebral infarction: Secondary | ICD-10-CM | POA: Diagnosis not present

## 2017-09-23 DIAGNOSIS — Z9181 History of falling: Secondary | ICD-10-CM | POA: Diagnosis not present

## 2017-09-23 DIAGNOSIS — Z79891 Long term (current) use of opiate analgesic: Secondary | ICD-10-CM | POA: Diagnosis not present

## 2017-09-23 DIAGNOSIS — I5032 Chronic diastolic (congestive) heart failure: Secondary | ICD-10-CM | POA: Diagnosis not present

## 2017-09-23 DIAGNOSIS — I110 Hypertensive heart disease with heart failure: Secondary | ICD-10-CM | POA: Diagnosis not present

## 2017-09-23 DIAGNOSIS — J449 Chronic obstructive pulmonary disease, unspecified: Secondary | ICD-10-CM | POA: Diagnosis not present

## 2017-09-23 DIAGNOSIS — I69354 Hemiplegia and hemiparesis following cerebral infarction affecting left non-dominant side: Secondary | ICD-10-CM | POA: Diagnosis not present

## 2017-09-23 DIAGNOSIS — Z7902 Long term (current) use of antithrombotics/antiplatelets: Secondary | ICD-10-CM | POA: Diagnosis not present

## 2017-09-23 DIAGNOSIS — Z8673 Personal history of transient ischemic attack (TIA), and cerebral infarction without residual deficits: Secondary | ICD-10-CM | POA: Diagnosis not present

## 2017-09-23 DIAGNOSIS — Z7982 Long term (current) use of aspirin: Secondary | ICD-10-CM | POA: Diagnosis not present

## 2017-10-02 DIAGNOSIS — Z8673 Personal history of transient ischemic attack (TIA), and cerebral infarction without residual deficits: Secondary | ICD-10-CM | POA: Diagnosis not present

## 2017-10-02 DIAGNOSIS — I5032 Chronic diastolic (congestive) heart failure: Secondary | ICD-10-CM | POA: Diagnosis not present

## 2017-10-02 DIAGNOSIS — J449 Chronic obstructive pulmonary disease, unspecified: Secondary | ICD-10-CM | POA: Diagnosis not present

## 2017-10-02 DIAGNOSIS — Z7902 Long term (current) use of antithrombotics/antiplatelets: Secondary | ICD-10-CM | POA: Diagnosis not present

## 2017-10-02 DIAGNOSIS — I69354 Hemiplegia and hemiparesis following cerebral infarction affecting left non-dominant side: Secondary | ICD-10-CM | POA: Diagnosis not present

## 2017-10-02 DIAGNOSIS — Z7982 Long term (current) use of aspirin: Secondary | ICD-10-CM | POA: Diagnosis not present

## 2017-10-02 DIAGNOSIS — I110 Hypertensive heart disease with heart failure: Secondary | ICD-10-CM | POA: Diagnosis not present

## 2017-10-02 DIAGNOSIS — Z79891 Long term (current) use of opiate analgesic: Secondary | ICD-10-CM | POA: Diagnosis not present

## 2017-10-02 DIAGNOSIS — Z9181 History of falling: Secondary | ICD-10-CM | POA: Diagnosis not present

## 2017-10-02 DIAGNOSIS — I69393 Ataxia following cerebral infarction: Secondary | ICD-10-CM | POA: Diagnosis not present

## 2017-10-16 DIAGNOSIS — I6789 Other cerebrovascular disease: Secondary | ICD-10-CM | POA: Diagnosis not present

## 2017-10-22 DIAGNOSIS — Z0001 Encounter for general adult medical examination with abnormal findings: Secondary | ICD-10-CM | POA: Diagnosis not present

## 2017-10-22 DIAGNOSIS — G8310 Monoplegia of lower limb affecting unspecified side: Secondary | ICD-10-CM | POA: Diagnosis not present

## 2017-10-22 DIAGNOSIS — Z6821 Body mass index (BMI) 21.0-21.9, adult: Secondary | ICD-10-CM | POA: Diagnosis not present

## 2017-10-22 DIAGNOSIS — Z1389 Encounter for screening for other disorder: Secondary | ICD-10-CM | POA: Diagnosis not present

## 2017-11-08 DIAGNOSIS — I10 Essential (primary) hypertension: Secondary | ICD-10-CM | POA: Diagnosis not present

## 2017-11-08 DIAGNOSIS — R296 Repeated falls: Secondary | ICD-10-CM | POA: Diagnosis not present

## 2017-11-08 DIAGNOSIS — M1990 Unspecified osteoarthritis, unspecified site: Secondary | ICD-10-CM | POA: Diagnosis not present

## 2017-11-08 DIAGNOSIS — I69393 Ataxia following cerebral infarction: Secondary | ICD-10-CM | POA: Diagnosis not present

## 2017-11-08 DIAGNOSIS — I69354 Hemiplegia and hemiparesis following cerebral infarction affecting left non-dominant side: Secondary | ICD-10-CM | POA: Diagnosis not present

## 2017-11-08 DIAGNOSIS — I6350 Cerebral infarction due to unspecified occlusion or stenosis of unspecified cerebral artery: Secondary | ICD-10-CM | POA: Diagnosis not present

## 2017-11-12 DIAGNOSIS — R296 Repeated falls: Secondary | ICD-10-CM | POA: Diagnosis not present

## 2017-11-12 DIAGNOSIS — I10 Essential (primary) hypertension: Secondary | ICD-10-CM | POA: Diagnosis not present

## 2017-11-12 DIAGNOSIS — R1310 Dysphagia, unspecified: Secondary | ICD-10-CM | POA: Diagnosis not present

## 2017-11-12 DIAGNOSIS — I69354 Hemiplegia and hemiparesis following cerebral infarction affecting left non-dominant side: Secondary | ICD-10-CM | POA: Diagnosis not present

## 2017-11-12 DIAGNOSIS — Z7902 Long term (current) use of antithrombotics/antiplatelets: Secondary | ICD-10-CM | POA: Diagnosis not present

## 2017-11-12 DIAGNOSIS — Z7982 Long term (current) use of aspirin: Secondary | ICD-10-CM | POA: Diagnosis not present

## 2017-11-12 DIAGNOSIS — J449 Chronic obstructive pulmonary disease, unspecified: Secondary | ICD-10-CM | POA: Diagnosis not present

## 2017-11-12 DIAGNOSIS — I69393 Ataxia following cerebral infarction: Secondary | ICD-10-CM | POA: Diagnosis not present

## 2017-11-12 DIAGNOSIS — M1990 Unspecified osteoarthritis, unspecified site: Secondary | ICD-10-CM | POA: Diagnosis not present

## 2017-11-15 DIAGNOSIS — Z7902 Long term (current) use of antithrombotics/antiplatelets: Secondary | ICD-10-CM | POA: Diagnosis not present

## 2017-11-15 DIAGNOSIS — R1310 Dysphagia, unspecified: Secondary | ICD-10-CM | POA: Diagnosis not present

## 2017-11-15 DIAGNOSIS — I69393 Ataxia following cerebral infarction: Secondary | ICD-10-CM | POA: Diagnosis not present

## 2017-11-15 DIAGNOSIS — I69354 Hemiplegia and hemiparesis following cerebral infarction affecting left non-dominant side: Secondary | ICD-10-CM | POA: Diagnosis not present

## 2017-11-15 DIAGNOSIS — M1990 Unspecified osteoarthritis, unspecified site: Secondary | ICD-10-CM | POA: Diagnosis not present

## 2017-11-15 DIAGNOSIS — R296 Repeated falls: Secondary | ICD-10-CM | POA: Diagnosis not present

## 2017-11-15 DIAGNOSIS — I10 Essential (primary) hypertension: Secondary | ICD-10-CM | POA: Diagnosis not present

## 2017-11-15 DIAGNOSIS — J449 Chronic obstructive pulmonary disease, unspecified: Secondary | ICD-10-CM | POA: Diagnosis not present

## 2017-11-15 DIAGNOSIS — Z7982 Long term (current) use of aspirin: Secondary | ICD-10-CM | POA: Diagnosis not present

## 2017-11-18 DIAGNOSIS — R296 Repeated falls: Secondary | ICD-10-CM | POA: Diagnosis not present

## 2017-11-18 DIAGNOSIS — M1990 Unspecified osteoarthritis, unspecified site: Secondary | ICD-10-CM | POA: Diagnosis not present

## 2017-11-18 DIAGNOSIS — Z7902 Long term (current) use of antithrombotics/antiplatelets: Secondary | ICD-10-CM | POA: Diagnosis not present

## 2017-11-18 DIAGNOSIS — R1310 Dysphagia, unspecified: Secondary | ICD-10-CM | POA: Diagnosis not present

## 2017-11-18 DIAGNOSIS — J449 Chronic obstructive pulmonary disease, unspecified: Secondary | ICD-10-CM | POA: Diagnosis not present

## 2017-11-18 DIAGNOSIS — Z7982 Long term (current) use of aspirin: Secondary | ICD-10-CM | POA: Diagnosis not present

## 2017-11-18 DIAGNOSIS — I69393 Ataxia following cerebral infarction: Secondary | ICD-10-CM | POA: Diagnosis not present

## 2017-11-18 DIAGNOSIS — I69354 Hemiplegia and hemiparesis following cerebral infarction affecting left non-dominant side: Secondary | ICD-10-CM | POA: Diagnosis not present

## 2017-11-18 DIAGNOSIS — I10 Essential (primary) hypertension: Secondary | ICD-10-CM | POA: Diagnosis not present

## 2017-11-20 DIAGNOSIS — J449 Chronic obstructive pulmonary disease, unspecified: Secondary | ICD-10-CM | POA: Diagnosis not present

## 2017-11-20 DIAGNOSIS — Z6821 Body mass index (BMI) 21.0-21.9, adult: Secondary | ICD-10-CM | POA: Diagnosis not present

## 2017-11-20 DIAGNOSIS — J329 Chronic sinusitis, unspecified: Secondary | ICD-10-CM | POA: Diagnosis not present

## 2017-11-20 DIAGNOSIS — L0292 Furuncle, unspecified: Secondary | ICD-10-CM | POA: Diagnosis not present

## 2017-11-20 DIAGNOSIS — I10 Essential (primary) hypertension: Secondary | ICD-10-CM | POA: Diagnosis not present

## 2017-11-21 DIAGNOSIS — Z7902 Long term (current) use of antithrombotics/antiplatelets: Secondary | ICD-10-CM | POA: Diagnosis not present

## 2017-11-21 DIAGNOSIS — I10 Essential (primary) hypertension: Secondary | ICD-10-CM | POA: Diagnosis not present

## 2017-11-21 DIAGNOSIS — M1990 Unspecified osteoarthritis, unspecified site: Secondary | ICD-10-CM | POA: Diagnosis not present

## 2017-11-21 DIAGNOSIS — J449 Chronic obstructive pulmonary disease, unspecified: Secondary | ICD-10-CM | POA: Diagnosis not present

## 2017-11-21 DIAGNOSIS — R1310 Dysphagia, unspecified: Secondary | ICD-10-CM | POA: Diagnosis not present

## 2017-11-21 DIAGNOSIS — R296 Repeated falls: Secondary | ICD-10-CM | POA: Diagnosis not present

## 2017-11-21 DIAGNOSIS — I69393 Ataxia following cerebral infarction: Secondary | ICD-10-CM | POA: Diagnosis not present

## 2017-11-21 DIAGNOSIS — Z7982 Long term (current) use of aspirin: Secondary | ICD-10-CM | POA: Diagnosis not present

## 2017-11-21 DIAGNOSIS — I69354 Hemiplegia and hemiparesis following cerebral infarction affecting left non-dominant side: Secondary | ICD-10-CM | POA: Diagnosis not present

## 2017-11-26 DIAGNOSIS — R1310 Dysphagia, unspecified: Secondary | ICD-10-CM | POA: Diagnosis not present

## 2017-11-26 DIAGNOSIS — M1990 Unspecified osteoarthritis, unspecified site: Secondary | ICD-10-CM | POA: Diagnosis not present

## 2017-11-26 DIAGNOSIS — J449 Chronic obstructive pulmonary disease, unspecified: Secondary | ICD-10-CM | POA: Diagnosis not present

## 2017-11-26 DIAGNOSIS — I10 Essential (primary) hypertension: Secondary | ICD-10-CM | POA: Diagnosis not present

## 2017-11-26 DIAGNOSIS — I69393 Ataxia following cerebral infarction: Secondary | ICD-10-CM | POA: Diagnosis not present

## 2017-11-26 DIAGNOSIS — Z7982 Long term (current) use of aspirin: Secondary | ICD-10-CM | POA: Diagnosis not present

## 2017-11-26 DIAGNOSIS — Z7902 Long term (current) use of antithrombotics/antiplatelets: Secondary | ICD-10-CM | POA: Diagnosis not present

## 2017-11-26 DIAGNOSIS — R296 Repeated falls: Secondary | ICD-10-CM | POA: Diagnosis not present

## 2017-11-26 DIAGNOSIS — I69354 Hemiplegia and hemiparesis following cerebral infarction affecting left non-dominant side: Secondary | ICD-10-CM | POA: Diagnosis not present

## 2017-11-28 DIAGNOSIS — I69393 Ataxia following cerebral infarction: Secondary | ICD-10-CM | POA: Diagnosis not present

## 2017-11-28 DIAGNOSIS — I69354 Hemiplegia and hemiparesis following cerebral infarction affecting left non-dominant side: Secondary | ICD-10-CM | POA: Diagnosis not present

## 2017-11-28 DIAGNOSIS — J449 Chronic obstructive pulmonary disease, unspecified: Secondary | ICD-10-CM | POA: Diagnosis not present

## 2017-11-28 DIAGNOSIS — R296 Repeated falls: Secondary | ICD-10-CM | POA: Diagnosis not present

## 2017-11-28 DIAGNOSIS — Z7902 Long term (current) use of antithrombotics/antiplatelets: Secondary | ICD-10-CM | POA: Diagnosis not present

## 2017-11-28 DIAGNOSIS — Z7982 Long term (current) use of aspirin: Secondary | ICD-10-CM | POA: Diagnosis not present

## 2017-11-28 DIAGNOSIS — I10 Essential (primary) hypertension: Secondary | ICD-10-CM | POA: Diagnosis not present

## 2017-11-28 DIAGNOSIS — R1310 Dysphagia, unspecified: Secondary | ICD-10-CM | POA: Diagnosis not present

## 2017-11-28 DIAGNOSIS — M1990 Unspecified osteoarthritis, unspecified site: Secondary | ICD-10-CM | POA: Diagnosis not present

## 2017-12-03 DIAGNOSIS — I69354 Hemiplegia and hemiparesis following cerebral infarction affecting left non-dominant side: Secondary | ICD-10-CM | POA: Diagnosis not present

## 2017-12-03 DIAGNOSIS — Z7902 Long term (current) use of antithrombotics/antiplatelets: Secondary | ICD-10-CM | POA: Diagnosis not present

## 2017-12-03 DIAGNOSIS — R1310 Dysphagia, unspecified: Secondary | ICD-10-CM | POA: Diagnosis not present

## 2017-12-03 DIAGNOSIS — M1990 Unspecified osteoarthritis, unspecified site: Secondary | ICD-10-CM | POA: Diagnosis not present

## 2017-12-03 DIAGNOSIS — I69393 Ataxia following cerebral infarction: Secondary | ICD-10-CM | POA: Diagnosis not present

## 2017-12-03 DIAGNOSIS — Z7982 Long term (current) use of aspirin: Secondary | ICD-10-CM | POA: Diagnosis not present

## 2017-12-03 DIAGNOSIS — J449 Chronic obstructive pulmonary disease, unspecified: Secondary | ICD-10-CM | POA: Diagnosis not present

## 2017-12-03 DIAGNOSIS — I10 Essential (primary) hypertension: Secondary | ICD-10-CM | POA: Diagnosis not present

## 2017-12-03 DIAGNOSIS — R296 Repeated falls: Secondary | ICD-10-CM | POA: Diagnosis not present

## 2017-12-05 DIAGNOSIS — I69393 Ataxia following cerebral infarction: Secondary | ICD-10-CM | POA: Diagnosis not present

## 2017-12-05 DIAGNOSIS — I69354 Hemiplegia and hemiparesis following cerebral infarction affecting left non-dominant side: Secondary | ICD-10-CM | POA: Diagnosis not present

## 2017-12-05 DIAGNOSIS — R1310 Dysphagia, unspecified: Secondary | ICD-10-CM | POA: Diagnosis not present

## 2017-12-05 DIAGNOSIS — M79675 Pain in left toe(s): Secondary | ICD-10-CM | POA: Diagnosis not present

## 2017-12-05 DIAGNOSIS — J449 Chronic obstructive pulmonary disease, unspecified: Secondary | ICD-10-CM | POA: Diagnosis not present

## 2017-12-05 DIAGNOSIS — R296 Repeated falls: Secondary | ICD-10-CM | POA: Diagnosis not present

## 2017-12-05 DIAGNOSIS — I10 Essential (primary) hypertension: Secondary | ICD-10-CM | POA: Diagnosis not present

## 2017-12-05 DIAGNOSIS — L600 Ingrowing nail: Secondary | ICD-10-CM | POA: Diagnosis not present

## 2017-12-05 DIAGNOSIS — Z7982 Long term (current) use of aspirin: Secondary | ICD-10-CM | POA: Diagnosis not present

## 2017-12-05 DIAGNOSIS — M79674 Pain in right toe(s): Secondary | ICD-10-CM | POA: Diagnosis not present

## 2017-12-05 DIAGNOSIS — Z7902 Long term (current) use of antithrombotics/antiplatelets: Secondary | ICD-10-CM | POA: Diagnosis not present

## 2017-12-05 DIAGNOSIS — M1990 Unspecified osteoarthritis, unspecified site: Secondary | ICD-10-CM | POA: Diagnosis not present

## 2017-12-10 DIAGNOSIS — I69393 Ataxia following cerebral infarction: Secondary | ICD-10-CM | POA: Diagnosis not present

## 2017-12-10 DIAGNOSIS — I69354 Hemiplegia and hemiparesis following cerebral infarction affecting left non-dominant side: Secondary | ICD-10-CM | POA: Diagnosis not present

## 2017-12-10 DIAGNOSIS — M1990 Unspecified osteoarthritis, unspecified site: Secondary | ICD-10-CM | POA: Diagnosis not present

## 2017-12-10 DIAGNOSIS — Z7982 Long term (current) use of aspirin: Secondary | ICD-10-CM | POA: Diagnosis not present

## 2017-12-10 DIAGNOSIS — Z7902 Long term (current) use of antithrombotics/antiplatelets: Secondary | ICD-10-CM | POA: Diagnosis not present

## 2017-12-10 DIAGNOSIS — R1310 Dysphagia, unspecified: Secondary | ICD-10-CM | POA: Diagnosis not present

## 2017-12-10 DIAGNOSIS — R296 Repeated falls: Secondary | ICD-10-CM | POA: Diagnosis not present

## 2017-12-10 DIAGNOSIS — I10 Essential (primary) hypertension: Secondary | ICD-10-CM | POA: Diagnosis not present

## 2017-12-10 DIAGNOSIS — J449 Chronic obstructive pulmonary disease, unspecified: Secondary | ICD-10-CM | POA: Diagnosis not present

## 2017-12-11 DIAGNOSIS — M1991 Primary osteoarthritis, unspecified site: Secondary | ICD-10-CM | POA: Diagnosis not present

## 2017-12-11 DIAGNOSIS — S2232XA Fracture of one rib, left side, initial encounter for closed fracture: Secondary | ICD-10-CM | POA: Diagnosis not present

## 2017-12-11 DIAGNOSIS — Z6821 Body mass index (BMI) 21.0-21.9, adult: Secondary | ICD-10-CM | POA: Diagnosis not present

## 2017-12-11 DIAGNOSIS — L720 Epidermal cyst: Secondary | ICD-10-CM | POA: Diagnosis not present

## 2017-12-11 DIAGNOSIS — R079 Chest pain, unspecified: Secondary | ICD-10-CM | POA: Diagnosis not present

## 2017-12-11 DIAGNOSIS — S299XXA Unspecified injury of thorax, initial encounter: Secondary | ICD-10-CM | POA: Diagnosis not present

## 2017-12-11 DIAGNOSIS — J449 Chronic obstructive pulmonary disease, unspecified: Secondary | ICD-10-CM | POA: Diagnosis not present

## 2017-12-11 DIAGNOSIS — L219 Seborrheic dermatitis, unspecified: Secondary | ICD-10-CM | POA: Diagnosis not present

## 2017-12-14 DIAGNOSIS — I10 Essential (primary) hypertension: Secondary | ICD-10-CM | POA: Diagnosis not present

## 2017-12-14 DIAGNOSIS — J449 Chronic obstructive pulmonary disease, unspecified: Secondary | ICD-10-CM | POA: Diagnosis not present

## 2017-12-14 DIAGNOSIS — Z7982 Long term (current) use of aspirin: Secondary | ICD-10-CM | POA: Diagnosis not present

## 2017-12-14 DIAGNOSIS — R296 Repeated falls: Secondary | ICD-10-CM | POA: Diagnosis not present

## 2017-12-14 DIAGNOSIS — I69393 Ataxia following cerebral infarction: Secondary | ICD-10-CM | POA: Diagnosis not present

## 2017-12-14 DIAGNOSIS — I69354 Hemiplegia and hemiparesis following cerebral infarction affecting left non-dominant side: Secondary | ICD-10-CM | POA: Diagnosis not present

## 2017-12-14 DIAGNOSIS — R1310 Dysphagia, unspecified: Secondary | ICD-10-CM | POA: Diagnosis not present

## 2017-12-14 DIAGNOSIS — Z7902 Long term (current) use of antithrombotics/antiplatelets: Secondary | ICD-10-CM | POA: Diagnosis not present

## 2017-12-14 DIAGNOSIS — M1990 Unspecified osteoarthritis, unspecified site: Secondary | ICD-10-CM | POA: Diagnosis not present

## 2017-12-17 DIAGNOSIS — I69393 Ataxia following cerebral infarction: Secondary | ICD-10-CM | POA: Diagnosis not present

## 2017-12-17 DIAGNOSIS — R296 Repeated falls: Secondary | ICD-10-CM | POA: Diagnosis not present

## 2017-12-17 DIAGNOSIS — J449 Chronic obstructive pulmonary disease, unspecified: Secondary | ICD-10-CM | POA: Diagnosis not present

## 2017-12-17 DIAGNOSIS — Z7982 Long term (current) use of aspirin: Secondary | ICD-10-CM | POA: Diagnosis not present

## 2017-12-17 DIAGNOSIS — R1310 Dysphagia, unspecified: Secondary | ICD-10-CM | POA: Diagnosis not present

## 2017-12-17 DIAGNOSIS — Z7902 Long term (current) use of antithrombotics/antiplatelets: Secondary | ICD-10-CM | POA: Diagnosis not present

## 2017-12-17 DIAGNOSIS — I69354 Hemiplegia and hemiparesis following cerebral infarction affecting left non-dominant side: Secondary | ICD-10-CM | POA: Diagnosis not present

## 2017-12-17 DIAGNOSIS — I10 Essential (primary) hypertension: Secondary | ICD-10-CM | POA: Diagnosis not present

## 2017-12-17 DIAGNOSIS — M1990 Unspecified osteoarthritis, unspecified site: Secondary | ICD-10-CM | POA: Diagnosis not present

## 2017-12-21 DIAGNOSIS — J449 Chronic obstructive pulmonary disease, unspecified: Secondary | ICD-10-CM | POA: Diagnosis not present

## 2017-12-21 DIAGNOSIS — I10 Essential (primary) hypertension: Secondary | ICD-10-CM | POA: Diagnosis not present

## 2017-12-21 DIAGNOSIS — Z7982 Long term (current) use of aspirin: Secondary | ICD-10-CM | POA: Diagnosis not present

## 2017-12-21 DIAGNOSIS — I69354 Hemiplegia and hemiparesis following cerebral infarction affecting left non-dominant side: Secondary | ICD-10-CM | POA: Diagnosis not present

## 2017-12-21 DIAGNOSIS — M1990 Unspecified osteoarthritis, unspecified site: Secondary | ICD-10-CM | POA: Diagnosis not present

## 2017-12-21 DIAGNOSIS — R296 Repeated falls: Secondary | ICD-10-CM | POA: Diagnosis not present

## 2017-12-21 DIAGNOSIS — I69393 Ataxia following cerebral infarction: Secondary | ICD-10-CM | POA: Diagnosis not present

## 2017-12-21 DIAGNOSIS — Z7902 Long term (current) use of antithrombotics/antiplatelets: Secondary | ICD-10-CM | POA: Diagnosis not present

## 2017-12-21 DIAGNOSIS — R1310 Dysphagia, unspecified: Secondary | ICD-10-CM | POA: Diagnosis not present

## 2017-12-24 DIAGNOSIS — Z7982 Long term (current) use of aspirin: Secondary | ICD-10-CM | POA: Diagnosis not present

## 2017-12-24 DIAGNOSIS — R1310 Dysphagia, unspecified: Secondary | ICD-10-CM | POA: Diagnosis not present

## 2017-12-24 DIAGNOSIS — I69354 Hemiplegia and hemiparesis following cerebral infarction affecting left non-dominant side: Secondary | ICD-10-CM | POA: Diagnosis not present

## 2017-12-24 DIAGNOSIS — M1990 Unspecified osteoarthritis, unspecified site: Secondary | ICD-10-CM | POA: Diagnosis not present

## 2017-12-24 DIAGNOSIS — J449 Chronic obstructive pulmonary disease, unspecified: Secondary | ICD-10-CM | POA: Diagnosis not present

## 2017-12-24 DIAGNOSIS — Z7902 Long term (current) use of antithrombotics/antiplatelets: Secondary | ICD-10-CM | POA: Diagnosis not present

## 2017-12-24 DIAGNOSIS — I69393 Ataxia following cerebral infarction: Secondary | ICD-10-CM | POA: Diagnosis not present

## 2017-12-24 DIAGNOSIS — R296 Repeated falls: Secondary | ICD-10-CM | POA: Diagnosis not present

## 2017-12-24 DIAGNOSIS — I10 Essential (primary) hypertension: Secondary | ICD-10-CM | POA: Diagnosis not present

## 2017-12-26 DIAGNOSIS — I10 Essential (primary) hypertension: Secondary | ICD-10-CM | POA: Diagnosis not present

## 2017-12-26 DIAGNOSIS — Z7902 Long term (current) use of antithrombotics/antiplatelets: Secondary | ICD-10-CM | POA: Diagnosis not present

## 2017-12-26 DIAGNOSIS — Z7982 Long term (current) use of aspirin: Secondary | ICD-10-CM | POA: Diagnosis not present

## 2017-12-26 DIAGNOSIS — R296 Repeated falls: Secondary | ICD-10-CM | POA: Diagnosis not present

## 2017-12-26 DIAGNOSIS — I69354 Hemiplegia and hemiparesis following cerebral infarction affecting left non-dominant side: Secondary | ICD-10-CM | POA: Diagnosis not present

## 2017-12-26 DIAGNOSIS — I69393 Ataxia following cerebral infarction: Secondary | ICD-10-CM | POA: Diagnosis not present

## 2017-12-26 DIAGNOSIS — M1990 Unspecified osteoarthritis, unspecified site: Secondary | ICD-10-CM | POA: Diagnosis not present

## 2017-12-26 DIAGNOSIS — R1310 Dysphagia, unspecified: Secondary | ICD-10-CM | POA: Diagnosis not present

## 2017-12-26 DIAGNOSIS — J449 Chronic obstructive pulmonary disease, unspecified: Secondary | ICD-10-CM | POA: Diagnosis not present

## 2017-12-31 DIAGNOSIS — J449 Chronic obstructive pulmonary disease, unspecified: Secondary | ICD-10-CM | POA: Diagnosis not present

## 2017-12-31 DIAGNOSIS — M1990 Unspecified osteoarthritis, unspecified site: Secondary | ICD-10-CM | POA: Diagnosis not present

## 2017-12-31 DIAGNOSIS — I10 Essential (primary) hypertension: Secondary | ICD-10-CM | POA: Diagnosis not present

## 2017-12-31 DIAGNOSIS — R1310 Dysphagia, unspecified: Secondary | ICD-10-CM | POA: Diagnosis not present

## 2017-12-31 DIAGNOSIS — Z7902 Long term (current) use of antithrombotics/antiplatelets: Secondary | ICD-10-CM | POA: Diagnosis not present

## 2017-12-31 DIAGNOSIS — I69393 Ataxia following cerebral infarction: Secondary | ICD-10-CM | POA: Diagnosis not present

## 2017-12-31 DIAGNOSIS — I69354 Hemiplegia and hemiparesis following cerebral infarction affecting left non-dominant side: Secondary | ICD-10-CM | POA: Diagnosis not present

## 2017-12-31 DIAGNOSIS — Z7982 Long term (current) use of aspirin: Secondary | ICD-10-CM | POA: Diagnosis not present

## 2017-12-31 DIAGNOSIS — R296 Repeated falls: Secondary | ICD-10-CM | POA: Diagnosis not present

## 2018-01-02 DIAGNOSIS — J449 Chronic obstructive pulmonary disease, unspecified: Secondary | ICD-10-CM | POA: Diagnosis not present

## 2018-01-02 DIAGNOSIS — I69393 Ataxia following cerebral infarction: Secondary | ICD-10-CM | POA: Diagnosis not present

## 2018-01-02 DIAGNOSIS — I69354 Hemiplegia and hemiparesis following cerebral infarction affecting left non-dominant side: Secondary | ICD-10-CM | POA: Diagnosis not present

## 2018-01-02 DIAGNOSIS — Z7902 Long term (current) use of antithrombotics/antiplatelets: Secondary | ICD-10-CM | POA: Diagnosis not present

## 2018-01-02 DIAGNOSIS — Z7982 Long term (current) use of aspirin: Secondary | ICD-10-CM | POA: Diagnosis not present

## 2018-01-02 DIAGNOSIS — I10 Essential (primary) hypertension: Secondary | ICD-10-CM | POA: Diagnosis not present

## 2018-01-02 DIAGNOSIS — M1990 Unspecified osteoarthritis, unspecified site: Secondary | ICD-10-CM | POA: Diagnosis not present

## 2018-01-02 DIAGNOSIS — R1310 Dysphagia, unspecified: Secondary | ICD-10-CM | POA: Diagnosis not present

## 2018-01-02 DIAGNOSIS — R296 Repeated falls: Secondary | ICD-10-CM | POA: Diagnosis not present

## 2018-01-07 NOTE — Progress Notes (Signed)
Chief Complaint  Patient presents with  . Vaginal Bleeding      84 y.o. G3P3 No LMP recorded. Patient is postmenopausal. The current method of family planning is status post hysterectomy.  Outpatient Encounter Medications as of 07/04/2017  Medication Sig Note  . aspirin 81 MG chewable tablet Chew 81 mg by mouth daily.   . benzonatate (TESSALON PERLES) 100 MG capsule Take 1 capsule (100 mg total) by mouth 3 (three) times daily as needed for cough.   . busPIRone (BUSPAR) 5 MG tablet Take 5 mg by mouth 3 (three) times daily.   . clopidogrel (PLAVIX) 75 MG tablet Take 75 mg by mouth daily.   Marland Kitchen. diltiazem (CARDIZEM) 30 MG tablet Take 30 mg by mouth 3 (three) times daily before meals. For swallowing before meals    . docusate sodium (COLACE) 100 MG capsule Take 1 capsule (100 mg total) by mouth 2 (two) times daily.   Marland Kitchen. doxycycline (VIBRA-TABS) 100 MG tablet Take 1 tablet (100 mg total) by mouth every 12 (twelve) hours.   . ENSURE (ENSURE) Take 237 mLs by mouth 2 (two) times daily between meals.   . furosemide (LASIX) 40 MG tablet Take 1 tablet (40 mg total) by mouth daily.   . Ipratropium-Albuterol (COMBIVENT) 20-100 MCG/ACT AERS respimat Inhale 1 puff into the lungs every 6 (six) hours as needed for wheezing or shortness of breath.   . meprobamate (EQUANIL) 200 MG tablet Take 1 tablet (200 mg total) by mouth 3 (three) times daily.   . methocarbamol (ROBAXIN) 500 MG tablet Take 1 tablet (500 mg total) by mouth every 8 (eight) hours as needed for muscle spasms.   . Multiple Vitamins-Minerals (CENTRUM SILVER PO) Take 1 tablet by mouth daily.  01/29/2017: On hold from 01/23/17-02/02/17 per MAR.  Marland Kitchen. oxybutynin (DITROPAN) 5 MG tablet Take 5 mg by mouth every 8 (eight) hours as needed for bladder spasms.   Marland Kitchen. oxyCODONE-acetaminophen (PERCOCET/ROXICET) 5-325 MG tablet Take 1 tablet by mouth every 4 (four) hours as needed for moderate pain.   . polyethylene glycol (MIRALAX / GLYCOLAX) packet Take 17 g  by mouth daily as needed for mild constipation.   . predniSONE (DELTASONE) 20 MG tablet Take 3 tablets by mouth daily times 1 day; then 2 tablets by mouth daily times 2 days; then 1 tablet by mouth daily times 3 days; then half tablet by mouth daily times 3 days and stop prednisone.   . senna (SENOKOT) 8.6 MG TABS tablet Take 1 tablet (8.6 mg total) by mouth 2 (two) times daily.   . sertraline (ZOLOFT) 50 MG tablet Take 50 mg by mouth daily.   Marland Kitchen. terbinafine (LAMISIL) 250 MG tablet Take 250 mg by mouth daily.   . vitamin B-12 1000 MCG tablet Take 1 tablet (1,000 mcg total) by mouth daily.   . [DISCONTINUED] guaiFENesin-dextromethorphan (ROBITUSSIN DM) 100-10 MG/5ML syrup Take 5 mLs by mouth every 4 (four) hours as needed for cough.    No facility-administered encounter medications on file as of 07/04/2017.     Subjective Brought in for staining in adult pad, no heavy bleeding or anything other described No pain Pt is on plavix and aspirin Past Medical History:  Diagnosis Date  . Carotid artery disease (HCC)    Status post carotid endarterectomy x2  . Essential hypertension, benign   . Hemiparesis affecting left side as late effect of cerebrovascular accident (HCC) 08/03/2011  . Stroke (HCC) 1997  . Tremor  Past Surgical History:  Procedure Laterality Date  . ANTERIOR APPROACH HEMI HIP ARTHROPLASTY Left 12/28/2016   Procedure: ANTERIOR APPROACH HEMI HIP ARTHROPLASTY;  Surgeon: Samson Frederic, MD;  Location: [facility] OR;  Service: Orthopedics;  Laterality: Left;  . CAROTID ENDARTERECTOMY     Right    OB History    Gravida  3   Para  3   Term      Preterm      AB      Living  3     SAB      TAB      Ectopic      Multiple      Live Births              Allergies  Allergen Reactions  . No Known Allergies     Social History   Socioeconomic History  . Marital status: Widowed    Spouse name: Not on file  . Number of children: Not on file  . Years of  education: Not on file  . Highest education level: Not on file  Occupational History  . Not on file  Social Needs  . Financial resource strain: Not on file  . Food insecurity:    Worry: Not on file    Inability: Not on file  . Transportation needs:    Medical: Not on file    Non-medical: Not on file  Tobacco Use  . Smoking status: Never Smoker  . Smokeless tobacco: Never Used  Substance and Sexual Activity  . Alcohol use: No  . Drug use: No  . Sexual activity: Never  Lifestyle  . Physical activity:    Days per week: Not on file    Minutes per session: Not on file  . Stress: Not on file  Relationships  . Social connections:    Talks on phone: Not on file    Gets together: Not on file    Attends religious service: Not on file    Active member of club or organization: Not on file    Attends meetings of clubs or organizations: Not on file    Relationship status: Not on file  Other Topics Concern  . Not on file  Social History Narrative  . Not on file    Family History  Problem Relation Age of Onset  . Colon cancer Mother   . Heart disease Mother   . Bladder Cancer Brother   . Cirrhosis Brother        Liver cirrhosis    Medications:       Current Outpatient Medications:  .  aspirin 81 MG chewable tablet, Chew 81 mg by mouth daily., Disp: , Rfl:  .  benzonatate (TESSALON PERLES) 100 MG capsule, Take 1 capsule (100 mg total) by mouth 3 (three) times daily as needed for cough., Disp: 30 capsule, Rfl: 0 .  busPIRone (BUSPAR) 5 MG tablet, Take 5 mg by mouth 3 (three) times daily., Disp: , Rfl:  .  clopidogrel (PLAVIX) 75 MG tablet, Take 75 mg by mouth daily., Disp: , Rfl:  .  diltiazem (CARDIZEM) 30 MG tablet, Take 30 mg by mouth 3 (three) times daily before meals. For swallowing before meals , Disp: , Rfl:  .  docusate sodium (COLACE) 100 MG capsule, Take 1 capsule (100 mg total) by mouth 2 (two) times daily., Disp: 10 capsule, Rfl: 0 .  doxycycline (VIBRA-TABS) 100 MG  tablet, Take 1 tablet (100 mg total) by mouth every 12 (twelve) hours.,  Disp: 10 tablet, Rfl: 0 .  ENSURE (ENSURE), Take 237 mLs by mouth 2 (two) times daily between meals., Disp: , Rfl:  .  furosemide (LASIX) 40 MG tablet, Take 1 tablet (40 mg total) by mouth daily., Disp: 30 tablet, Rfl: 1 .  Ipratropium-Albuterol (COMBIVENT) 20-100 MCG/ACT AERS respimat, Inhale 1 puff into the lungs every 6 (six) hours as needed for wheezing or shortness of breath., Disp: 4 g, Rfl: 1 .  meprobamate (EQUANIL) 200 MG tablet, Take 1 tablet (200 mg total) by mouth 3 (three) times daily., Disp: 90 tablet, Rfl: 0 .  methocarbamol (ROBAXIN) 500 MG tablet, Take 1 tablet (500 mg total) by mouth every 8 (eight) hours as needed for muscle spasms., Disp: 20 tablet, Rfl: 0 .  Multiple Vitamins-Minerals (CENTRUM SILVER PO), Take 1 tablet by mouth daily. , Disp: , Rfl:  .  oxybutynin (DITROPAN) 5 MG tablet, Take 5 mg by mouth every 8 (eight) hours as needed for bladder spasms., Disp: , Rfl:  .  oxyCODONE-acetaminophen (PERCOCET/ROXICET) 5-325 MG tablet, Take 1 tablet by mouth every 4 (four) hours as needed for moderate pain., Disp: 20 tablet, Rfl: 0 .  polyethylene glycol (MIRALAX / GLYCOLAX) packet, Take 17 g by mouth daily as needed for mild constipation., Disp: 14 each, Rfl: 0 .  predniSONE (DELTASONE) 20 MG tablet, Take 3 tablets by mouth daily times 1 day; then 2 tablets by mouth daily times 2 days; then 1 tablet by mouth daily times 3 days; then half tablet by mouth daily times 3 days and stop prednisone., Disp: 12 tablet, Rfl: 0 .  senna (SENOKOT) 8.6 MG TABS tablet, Take 1 tablet (8.6 mg total) by mouth 2 (two) times daily., Disp: 30 each, Rfl: 0 .  sertraline (ZOLOFT) 50 MG tablet, Take 50 mg by mouth daily., Disp: , Rfl:  .  terbinafine (LAMISIL) 250 MG tablet, Take 250 mg by mouth daily., Disp: , Rfl:  .  vitamin B-12 1000 MCG tablet, Take 1 tablet (1,000 mcg total) by mouth daily., Disp: 30 tablet, Rfl:  1  Objective Blood pressure 118/74, pulse 89, height 5\' 4"  (1.626 m), weight 117 lb (53.1 kg).  +caruncle is present vvagina no blood seen  Pertinent ROS   Labs or studies     Impression Diagnoses this Encounter::   ICD-10-CM   1. Urethral caruncle N36.2     Established relevant diagnosis(es):   Plan/Recommendations: No orders of the defined types were placed in this encounter.   Labs or Scans Ordered: No orders of the defined types were placed in this encounter.   Management:: No further eval or tratment needed unless otherwise clinical course changes  Follow up Return if symptoms worsen or fail to improve.        Face to face time:  10 minutes  Greater than 50% of the visit time was spent in counseling and coordination of care with the patient.  The summary and outline of the counseling and care coordination is summarized in the note above.   All questions were answered.

## 2018-01-22 DIAGNOSIS — E782 Mixed hyperlipidemia: Secondary | ICD-10-CM | POA: Diagnosis not present

## 2018-01-22 DIAGNOSIS — K219 Gastro-esophageal reflux disease without esophagitis: Secondary | ICD-10-CM | POA: Diagnosis not present

## 2018-01-22 DIAGNOSIS — J209 Acute bronchitis, unspecified: Secondary | ICD-10-CM | POA: Diagnosis not present

## 2018-01-22 DIAGNOSIS — Z1389 Encounter for screening for other disorder: Secondary | ICD-10-CM | POA: Diagnosis not present

## 2018-01-22 DIAGNOSIS — I10 Essential (primary) hypertension: Secondary | ICD-10-CM | POA: Diagnosis not present

## 2018-02-07 ENCOUNTER — Ambulatory Visit (HOSPITAL_COMMUNITY)
Admission: RE | Admit: 2018-02-07 | Discharge: 2018-02-07 | Disposition: A | Discharge status: Home / Self Care | Payer: Medicare Other | Source: Ambulatory Visit | Attending: Internal Medicine | Admitting: Internal Medicine

## 2018-02-07 ENCOUNTER — Other Ambulatory Visit (HOSPITAL_COMMUNITY): Payer: Self-pay | Admitting: Internal Medicine

## 2018-02-07 DIAGNOSIS — R059 Cough, unspecified: Secondary | ICD-10-CM

## 2018-02-07 DIAGNOSIS — L723 Sebaceous cyst: Secondary | ICD-10-CM | POA: Diagnosis not present

## 2018-02-07 DIAGNOSIS — R05 Cough: Secondary | ICD-10-CM | POA: Diagnosis not present

## 2018-02-07 DIAGNOSIS — J449 Chronic obstructive pulmonary disease, unspecified: Secondary | ICD-10-CM | POA: Diagnosis not present

## 2018-05-29 ENCOUNTER — Other Ambulatory Visit: Payer: Self-pay | Admitting: Internal Medicine

## 2018-05-29 ENCOUNTER — Other Ambulatory Visit (HOSPITAL_COMMUNITY): Payer: Self-pay | Admitting: Internal Medicine

## 2018-05-29 DIAGNOSIS — M7989 Other specified soft tissue disorders: Secondary | ICD-10-CM | POA: Diagnosis not present

## 2018-05-29 DIAGNOSIS — Z6821 Body mass index (BMI) 21.0-21.9, adult: Secondary | ICD-10-CM | POA: Diagnosis not present

## 2018-05-29 DIAGNOSIS — R600 Localized edema: Secondary | ICD-10-CM | POA: Diagnosis not present

## 2018-05-29 DIAGNOSIS — M79605 Pain in left leg: Secondary | ICD-10-CM

## 2018-05-29 DIAGNOSIS — Z1389 Encounter for screening for other disorder: Secondary | ICD-10-CM | POA: Diagnosis not present

## 2018-05-29 DIAGNOSIS — Z0001 Encounter for general adult medical examination with abnormal findings: Secondary | ICD-10-CM | POA: Diagnosis not present

## 2018-05-29 DIAGNOSIS — R2681 Unsteadiness on feet: Secondary | ICD-10-CM | POA: Diagnosis not present

## 2018-05-30 ENCOUNTER — Ambulatory Visit (HOSPITAL_COMMUNITY)
Admission: RE | Admit: 2018-05-30 | Discharge: 2018-05-30 | Disposition: A | Discharge status: Home / Self Care | Payer: Medicare Other | Source: Ambulatory Visit | Attending: Internal Medicine | Admitting: Internal Medicine

## 2018-05-30 DIAGNOSIS — M7989 Other specified soft tissue disorders: Secondary | ICD-10-CM | POA: Diagnosis not present

## 2018-05-30 DIAGNOSIS — M79605 Pain in left leg: Secondary | ICD-10-CM | POA: Insufficient documentation

## 2018-06-24 DIAGNOSIS — R05 Cough: Secondary | ICD-10-CM | POA: Diagnosis not present

## 2018-08-21 DIAGNOSIS — M79675 Pain in left toe(s): Secondary | ICD-10-CM | POA: Diagnosis not present

## 2018-08-21 DIAGNOSIS — M79674 Pain in right toe(s): Secondary | ICD-10-CM | POA: Diagnosis not present

## 2018-08-21 DIAGNOSIS — B351 Tinea unguium: Secondary | ICD-10-CM | POA: Diagnosis not present

## 2018-09-03 DIAGNOSIS — L03119 Cellulitis of unspecified part of limb: Secondary | ICD-10-CM | POA: Diagnosis not present

## 2018-09-03 DIAGNOSIS — I872 Venous insufficiency (chronic) (peripheral): Secondary | ICD-10-CM | POA: Diagnosis not present

## 2018-09-03 DIAGNOSIS — I8312 Varicose veins of left lower extremity with inflammation: Secondary | ICD-10-CM | POA: Diagnosis not present

## 2018-09-11 DIAGNOSIS — N390 Urinary tract infection, site not specified: Secondary | ICD-10-CM | POA: Diagnosis not present

## 2018-10-13 DEATH — deceased

## 2019-01-19 IMAGING — DX DG SHOULDER 2+V*L*
2 series · 2 of 2 positions shown · non-contrast
Comparison: 01/04/2016

CLINICAL DATA: Fall with pain on the left side

EXAM:
LEFT SHOULDER - 2+ VIEW

[shoulder grashey]
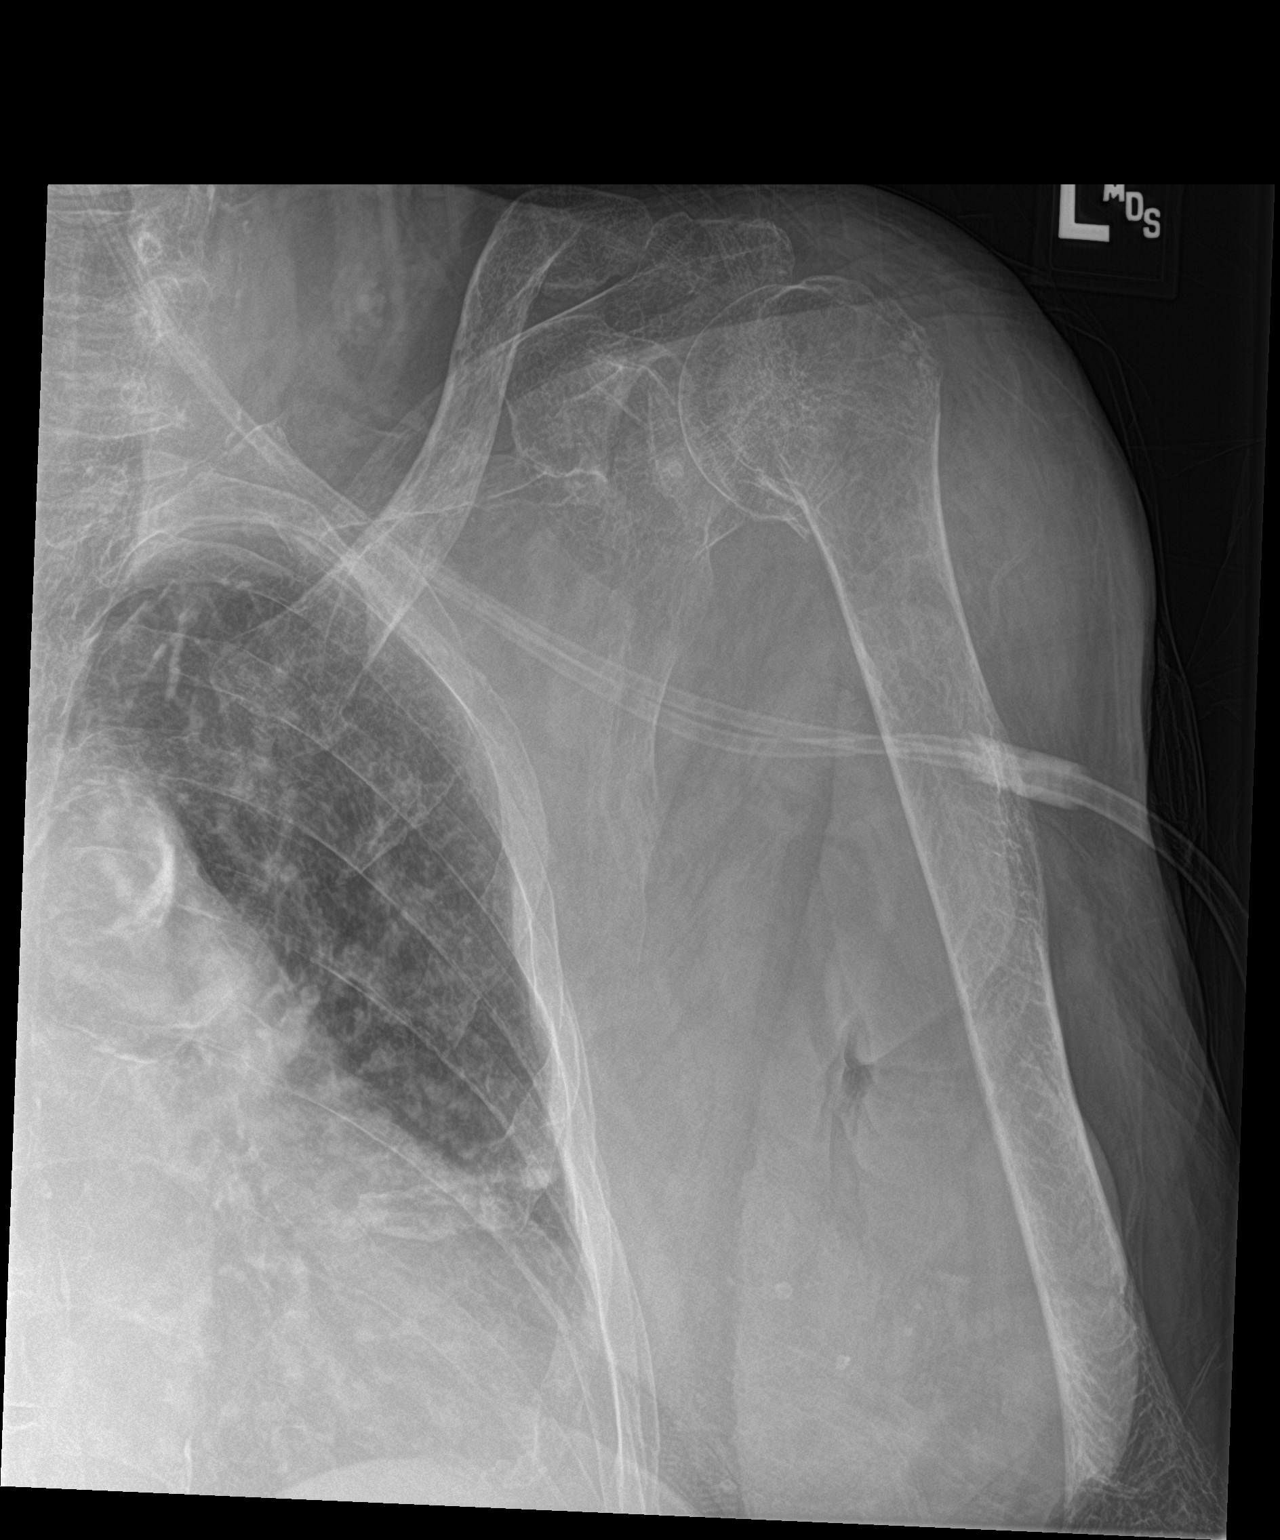

[shoulder y view]
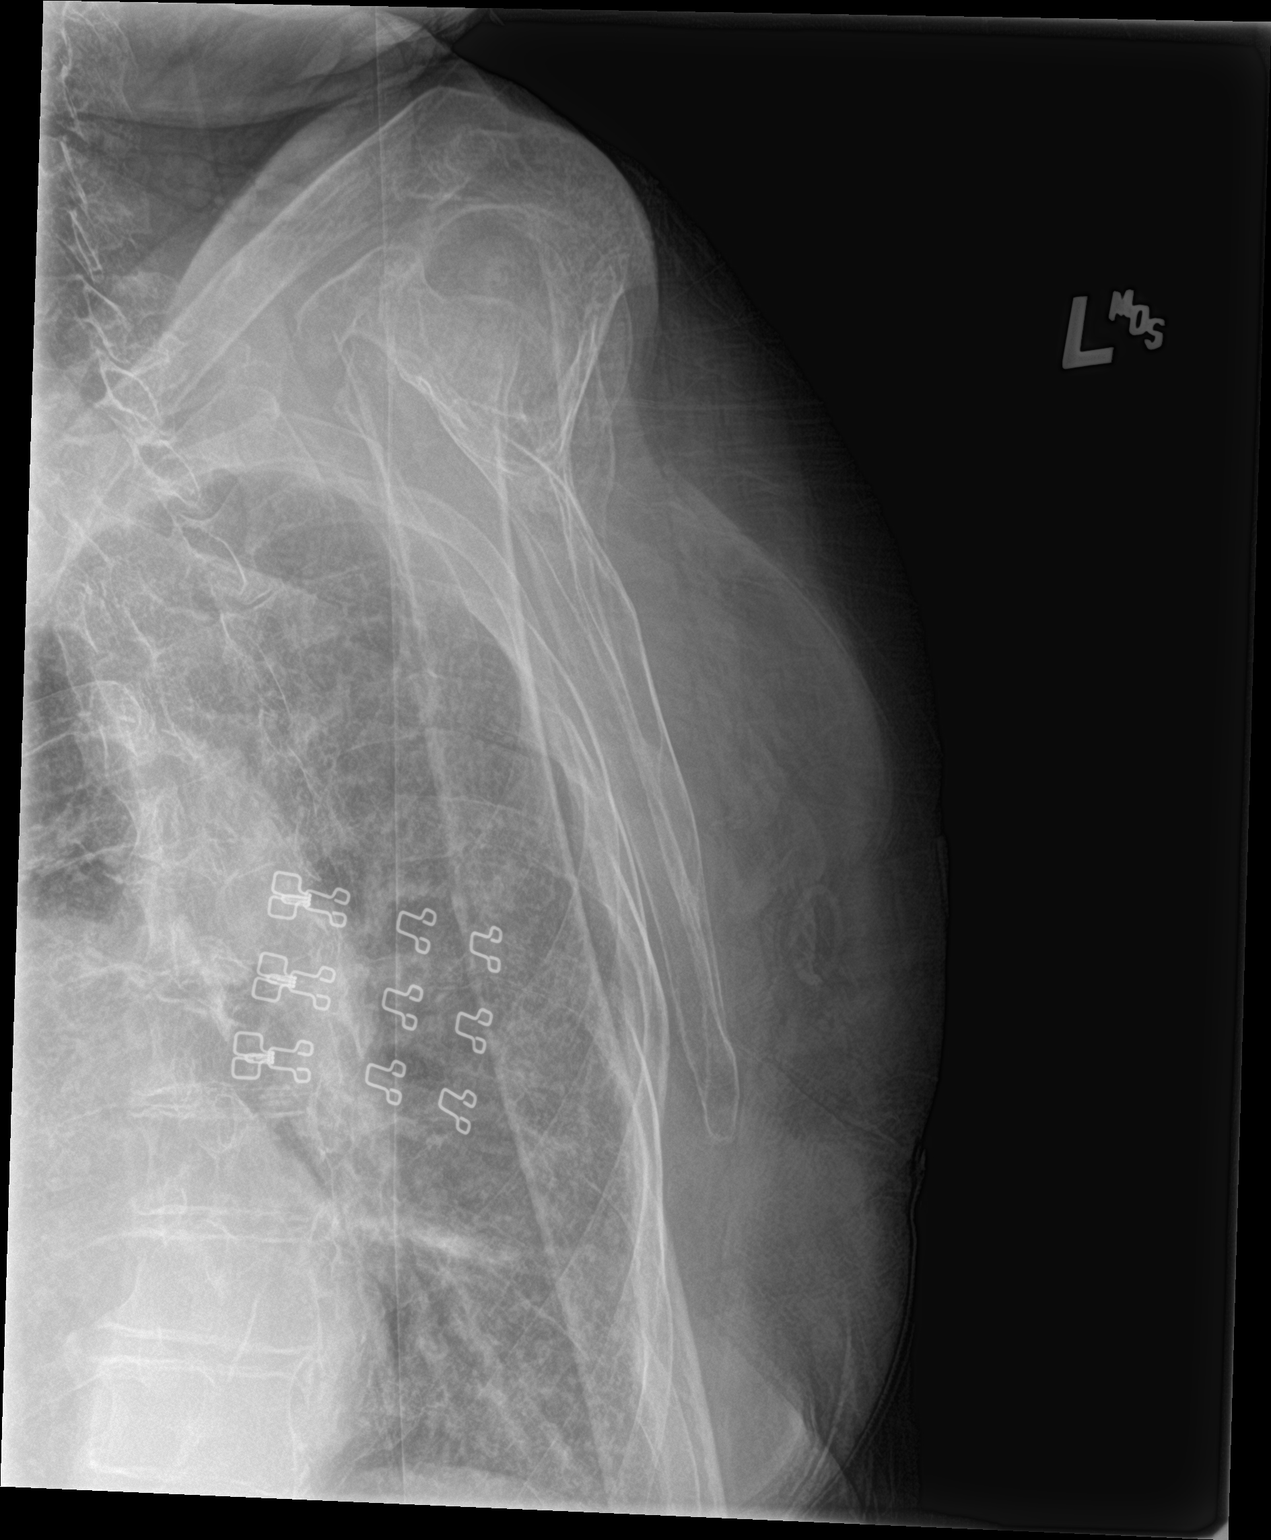

[2 of 2 positions shown; findings below may reference images not displayed]

FINDINGS: Old distal left clavicle fracture.  Old left upper rib fractures.

Acute left humeral neck fracture with less than [DATE] bone with of
central displacement on axillary view. No dislocation.
IMPRESSION: 1. Diffuse osteopenia.
2. Acute left humeral neck fracture. No dislocation of the humeral
head
3. Old left clavicle and rib fractures

## 2019-01-19 IMAGING — DX DG CHEST 1V PORT
1 series · 1 of 1 positions shown · non-contrast
Comparison: Chest radiograph performed 02/07/2016

CLINICAL DATA: Preoperative chest radiograph for hip and shoulder
fractures. Initial encounter.

EXAM:
PORTABLE CHEST 1 VIEW

[chest ap]
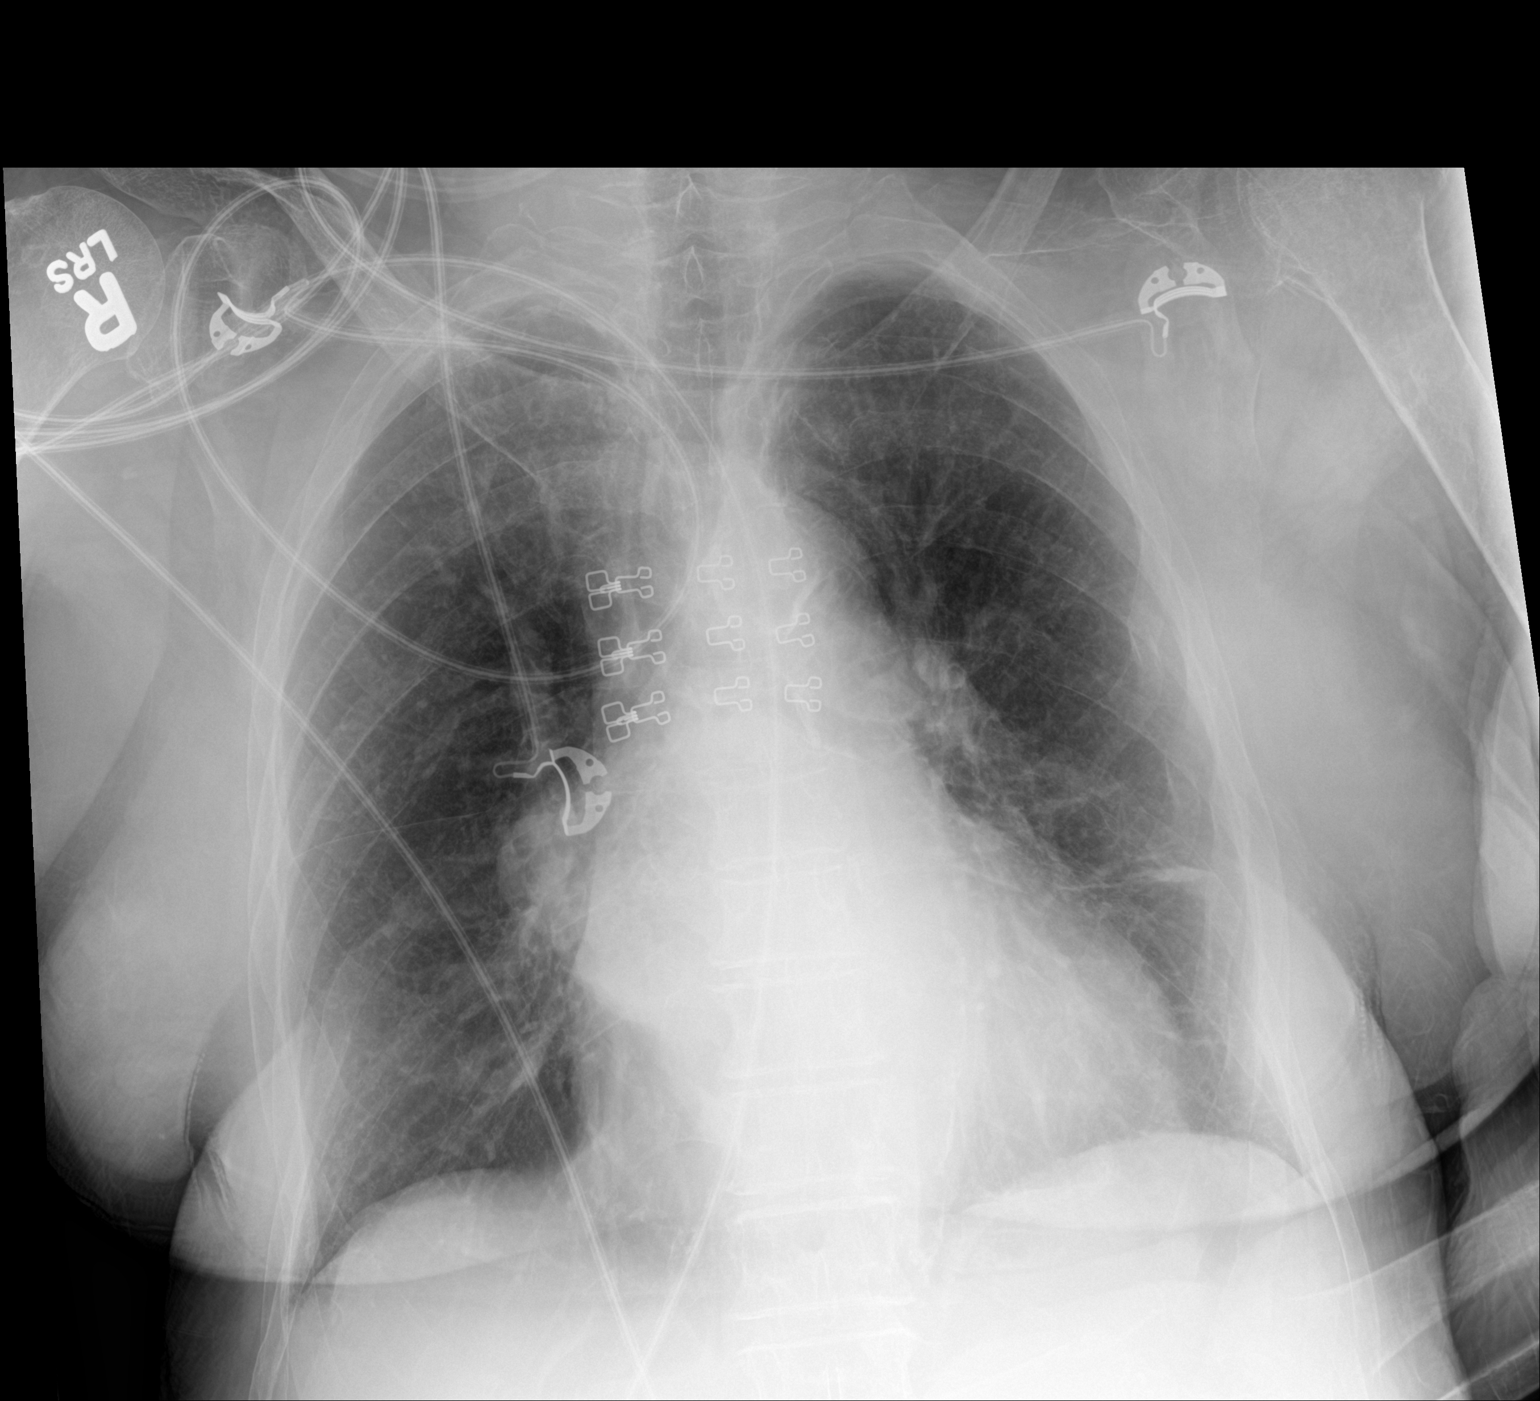

[1 of 1 positions shown; findings below may reference images not displayed]

FINDINGS: The lungs are well-aerated. Left midlung scarring is noted. There is
no evidence of pleural effusion or pneumothorax.

The cardiomediastinal silhouette is is mildly enlarged. The masslike
density overlying the right hilum reflects ingested material filling
the previously noted esophageal diverticulum.

The patient's known left humeral neck fracture is partially
characterized. No acute displaced rib fractures are seen. Chronic
left-sided rib deformities are noted.
IMPRESSION: 1. Mild left midlung scarring noted. Lungs otherwise clear. No acute
displaced rib fracture seen.
2. Mild cardiomegaly.
3. Ingested material seen filling the previously noted esophageal
diverticulum, overlying the right hilum.
4. Known left humeral neck fracture is partially characterized.

## 2019-01-20 IMAGING — CR DG PORTABLE PELVIS
1 series · 1 of 1 positions shown · non-contrast
Comparison: Portable exam 2240 hours compared intraoperative images
of 12/28/2016

CLINICAL DATA: LEFT hip replacement

EXAM:
PORTABLE PELVIS 1-2 VIEWS

[AP]
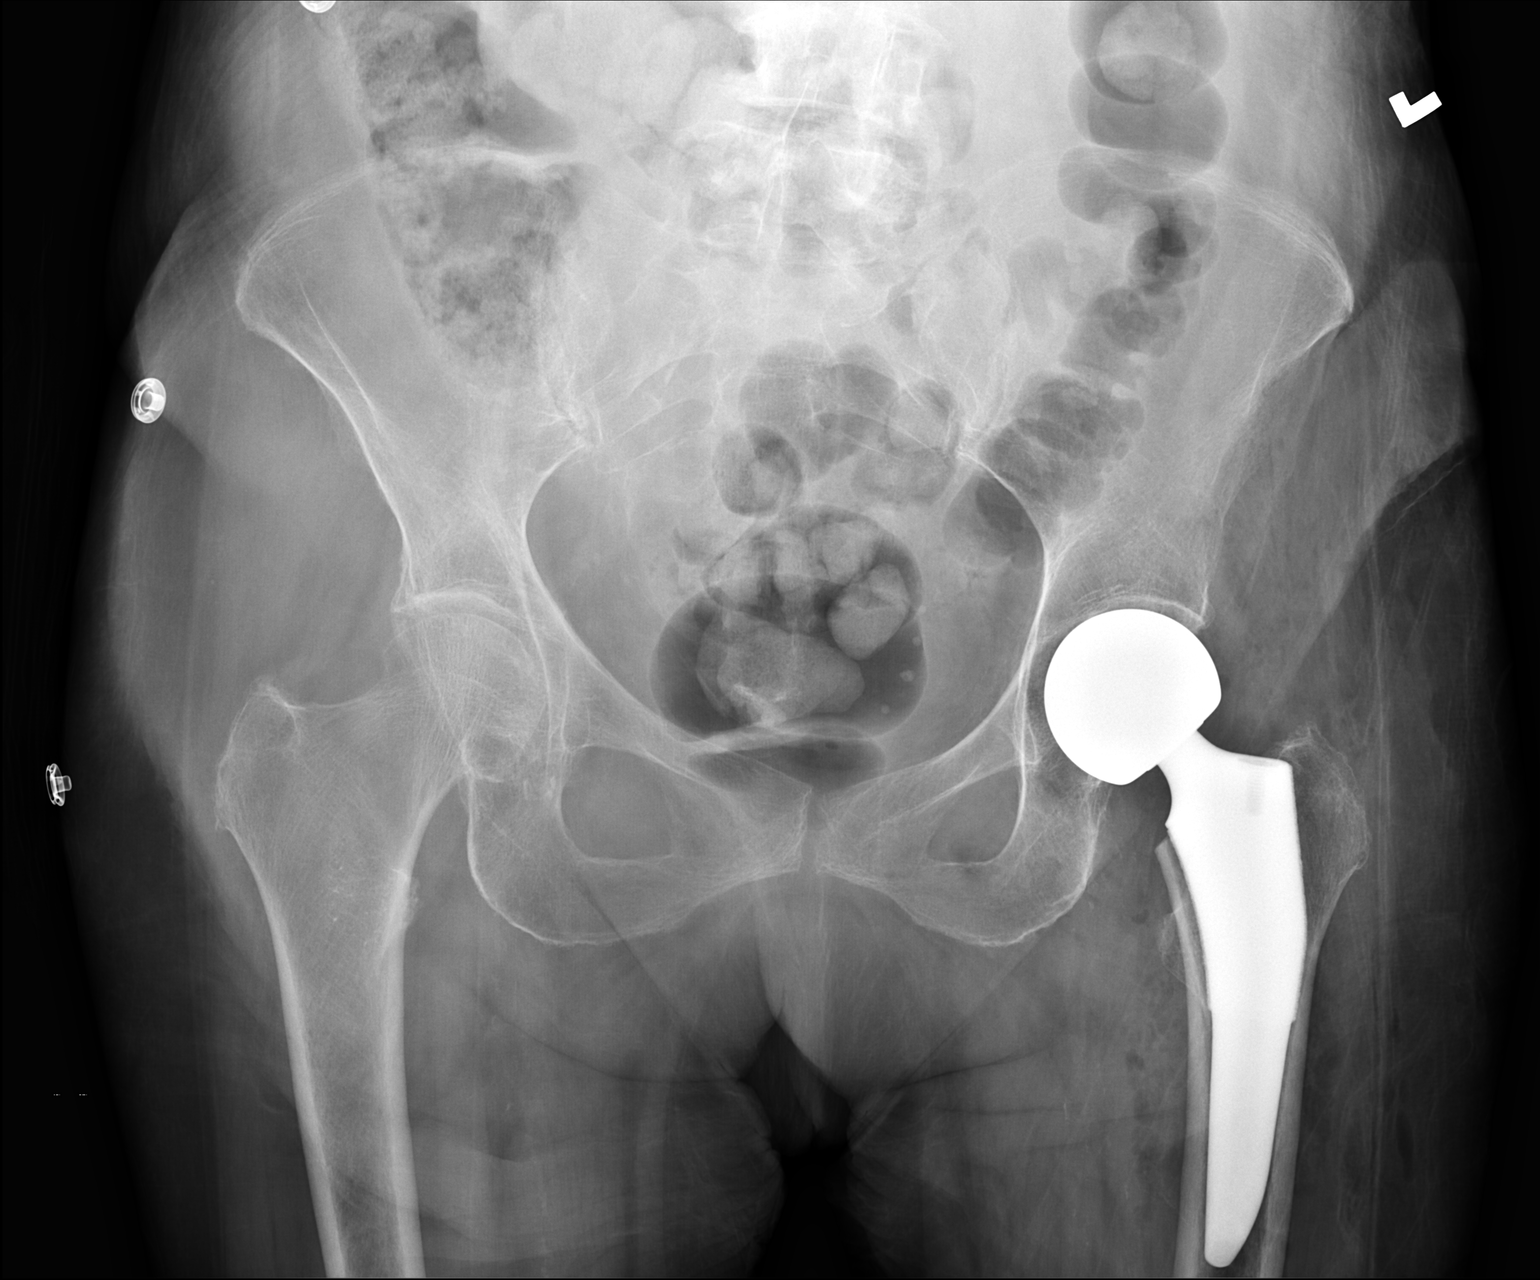

[1 of 1 positions shown; findings below may reference images not displayed]

FINDINGS: LEFT hip prosthesis in expected position.

Bones demineralized.

No fracture or dislocation.
IMPRESSION: LEFT hip prosthesis without acute complication.

## 2019-01-20 IMAGING — RF DG C-ARM 61-120 MIN
1 series · 2 of 2 positions shown · non-contrast
Comparison: 12/27/2016

CLINICAL DATA: Total left anterior hip replacement. Left hip
fracture

EXAM:
DG C-ARM 61-120 MIN; OPERATIVE LEFT HIP WITH PELVIS

[Series 1: run · 2 of 2 slices shown]
[im 1/2]
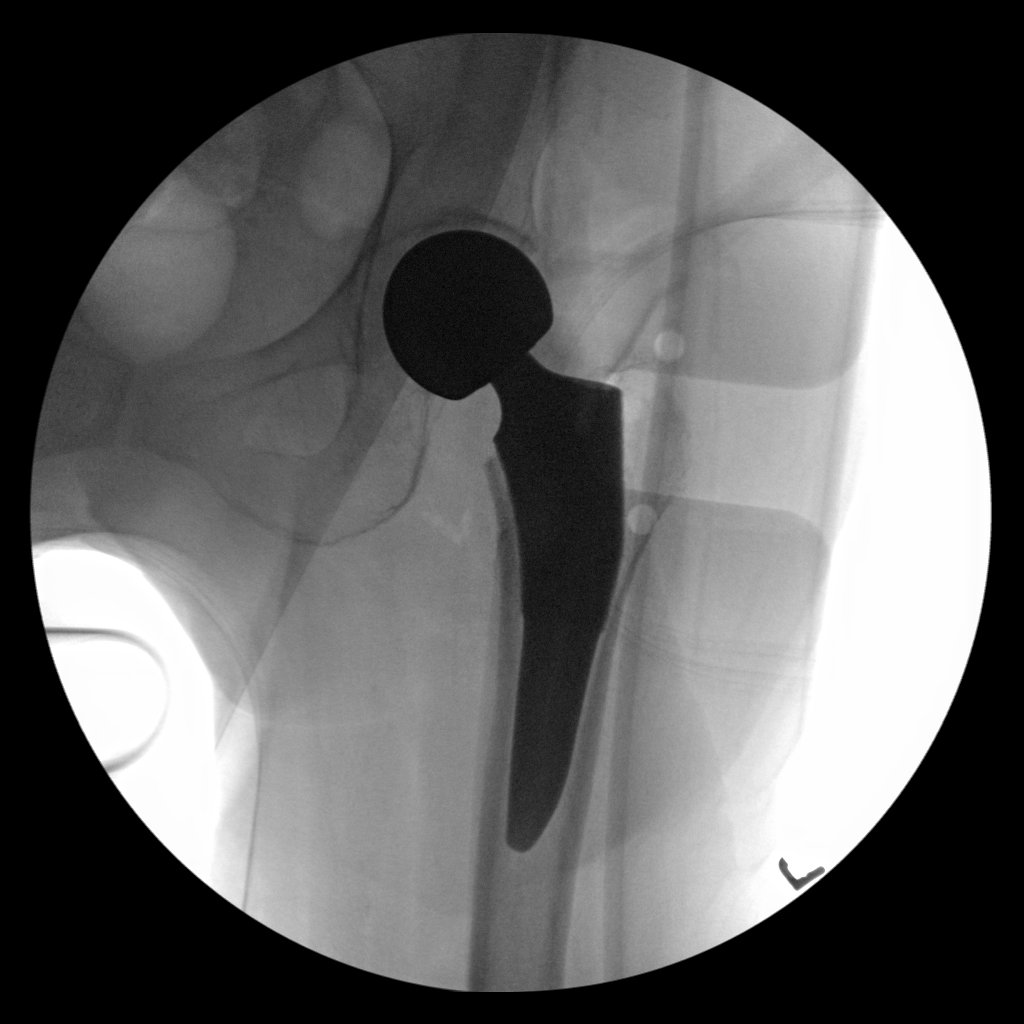
[im 2/2]
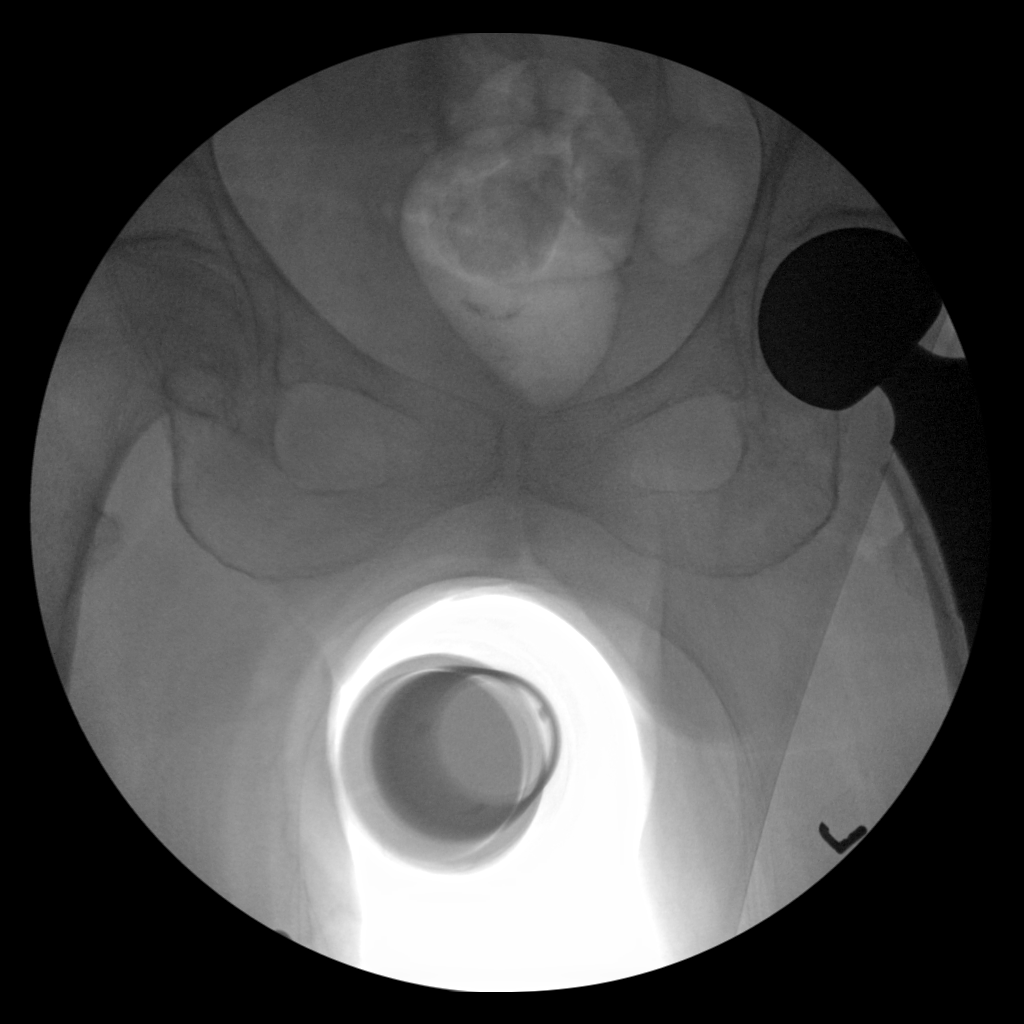

[2 of 2 positions shown; findings below may reference images not displayed]

FINDINGS: C-arm images were obtained in the operating room. Left hip
replacement in satisfactory position and alignment. No acute
complication
IMPRESSION: Satisfactory left hip replacement
